# Patient Record
Sex: Male | Born: 1935 | Race: Black or African American | Hispanic: No | Marital: Married | Smoking: Current every day smoker
Health system: Southern US, Community
[De-identification: ages and names within clinical notes are randomized; demographics above are authoritative.]

## PROBLEM LIST (undated history)

## (undated) DIAGNOSIS — C801 Malignant (primary) neoplasm, unspecified: Secondary | ICD-10-CM

## (undated) DIAGNOSIS — N400 Enlarged prostate without lower urinary tract symptoms: Secondary | ICD-10-CM

## (undated) DIAGNOSIS — M7989 Other specified soft tissue disorders: Secondary | ICD-10-CM

## (undated) DIAGNOSIS — I10 Essential (primary) hypertension: Secondary | ICD-10-CM

## (undated) DIAGNOSIS — J449 Chronic obstructive pulmonary disease, unspecified: Secondary | ICD-10-CM

## (undated) DIAGNOSIS — Z803 Family history of malignant neoplasm of breast: Secondary | ICD-10-CM

## (undated) DIAGNOSIS — Z972 Presence of dental prosthetic device (complete) (partial): Secondary | ICD-10-CM

## (undated) DIAGNOSIS — Z8042 Family history of malignant neoplasm of prostate: Secondary | ICD-10-CM

## (undated) HISTORY — PX: HERNIA REPAIR: SHX51

## (undated) HISTORY — DX: Family history of malignant neoplasm of prostate: Z80.42

## (undated) HISTORY — PX: COLONOSCOPY: SHX174

## (undated) HISTORY — DX: Family history of malignant neoplasm of breast: Z80.3

---

## 2007-11-02 ENCOUNTER — Ambulatory Visit: Payer: Self-pay | Admitting: Urology

## 2012-11-17 ENCOUNTER — Observation Stay: Payer: Self-pay | Admitting: Family Medicine

## 2012-11-17 LAB — URINALYSIS, COMPLETE
Bilirubin,UR: NEGATIVE
Blood: NEGATIVE
Glucose,UR: NEGATIVE mg/dL (ref 0–75)
Leukocyte Esterase: NEGATIVE
Nitrite: NEGATIVE
Protein: NEGATIVE
RBC,UR: 1 /HPF (ref 0–5)
Specific Gravity: 1.011 (ref 1.003–1.030)
Squamous Epithelial: <1

## 2012-11-17 LAB — CK TOTAL AND CKMB (NOT AT ARMC)
CK, Total: 206 U/L (ref 35–232)
CK-MB: 1.2 ng/mL (ref 0.5–3.6)

## 2012-11-17 LAB — CBC
HCT: 48.8 % (ref 40.0–52.0)
HGB: 15.9 g/dL (ref 13.0–18.0)
MCHC: 32.5 g/dL (ref 32.0–36.0)
Platelet: 305 10*3/uL (ref 150–440)
RBC: 5.47 10*6/uL (ref 4.40–5.90)
WBC: 5.9 10*3/uL (ref 3.8–10.6)

## 2012-11-17 LAB — COMPREHENSIVE METABOLIC PANEL
Albumin: 3.6 g/dL (ref 3.4–5.0)
Alkaline Phosphatase: 79 U/L (ref 50–136)
Anion Gap: 8 (ref 7–16)
BUN: 11 mg/dL (ref 7–18)
Co2: 27 mmol/L (ref 21–32)
Creatinine: 1.05 mg/dL (ref 0.60–1.30)
EGFR (African American): >60
Glucose: 132 mg/dL — ABNORMAL HIGH (ref 65–99)
Potassium: 3.1 mmol/L — ABNORMAL LOW (ref 3.5–5.1)
SGOT(AST): 25 U/L (ref 15–37)
SGPT (ALT): 17 U/L (ref 12–78)
Sodium: 138 mmol/L (ref 136–145)
Total Protein: 7.5 g/dL (ref 6.4–8.2)

## 2012-11-17 LAB — TROPONIN I: Troponin-I: <0.02 ng/mL

## 2012-11-18 LAB — LIPID PANEL
Cholesterol: 126 mg/dL
HDL Cholesterol: 59 mg/dL
Ldl Cholesterol, Calc: 51 mg/dL
Triglycerides: 82 mg/dL
VLDL Cholesterol, Calc: 16 mg/dL

## 2012-11-18 LAB — POTASSIUM: Potassium: 3.7 mmol/L

## 2012-11-18 LAB — TSH: Thyroid Stimulating Horm: 0.653 u[IU]/mL

## 2014-06-24 DIAGNOSIS — I10 Essential (primary) hypertension: Secondary | ICD-10-CM | POA: Insufficient documentation

## 2014-06-24 DIAGNOSIS — M549 Dorsalgia, unspecified: Secondary | ICD-10-CM | POA: Insufficient documentation

## 2014-06-24 DIAGNOSIS — E538 Deficiency of other specified B group vitamins: Secondary | ICD-10-CM | POA: Insufficient documentation

## 2014-06-24 DIAGNOSIS — N400 Enlarged prostate without lower urinary tract symptoms: Secondary | ICD-10-CM | POA: Insufficient documentation

## 2015-03-31 NOTE — H&P (Signed)
PATIENT NAME:  Jacob [name], Jacob [name] MR#:  [id number] DATE OF BIRTH:  06/19/1934  DATE OF ADMISSION:  11/17/2012  PRIMARY CARE PHYSICIAN: Nonlocal REFERRING PHYSICIAN: Dr. Renard Hamper  CHIEF COMPLAINT: Dizziness today.   HISTORY OF PRESENT ILLNESS: 81 year old African American male with history of hypertension presented to the ED with dizziness today. Patient was fine until this morning when he felt dizzy which is room spinning-like feeling and then patient almost passed out, fell to the floor and hurt his face. He had nausea, vomiting after fall but denies any headache, slurred speech, incontinence, or dysphagia. Patient has a lip laceration.   PAST MEDICAL HISTORY: Hypertension.   SOCIAL HISTORY: Smokes 1/2 pack a day for more than 40 years. Denies any alcohol drinking or illicit drugs.    FAMILY HISTORY: Mother had CVA.   PAST SURGICAL HISTORY: Hernia repair.   MEDICATIONS: Patient cannot remember his home medications. He said he is taking hypertension pill and fluid pill but did not take for the past two days.   ALLERGIES: No.   REVIEW OF SYSTEMS: CONSTITUTIONAL: Patient denies any fever, chills. No headache but has dizziness. No weakness. EYES: No double vision, blurred vision. ENT: No slurred speech, dysphagia, epistaxis or postnasal drip. CARDIOVASCULAR: No chest pain, palpitation, orthopnea, or nocturnal dyspnea. No leg edema. PULMONARY: No cough, sputum, shortness of breath or hemoptysis. GASTROINTESTINAL: No abdominal pain but has nausea, vomiting. No diarrhea, melena, or bloody stool. GENITOURINARY: No dysuria, hematuria. SKIN: No rash or jaundice. NEUROLOGY: Has near syncope, dizziness. No seizure. HEMATOLOGY: No easy bruising, bleeding.   PHYSICAL EXAMINATION:  VITAL SIGNS: Temperature 97.5, blood pressure 140/80, pulse 69, respirations 16, oxygen saturation 98% on room air.   GENERAL: Patient is alert, awake, oriented, in no acute distress.   HEENT: Pupils round, equal, reactive to light,  accommodation. Moist oral mucosa. Clear oropharynx. There is a mouth laceration on the upper lip.   NECK: Supple. No JVD or carotid bruits. No lymphadenopathy. No thyromegaly.   CARDIOVASCULAR: S1, S2 regular rate, rhythm. No murmurs, gallops.   PULMONARY: Bilateral air entry. No wheezing, rales. No use of accessory muscles to breathe.   ABDOMEN: Soft. No distention or tenderness. No organomegaly. Bowel sounds present.   EXTREMITIES: No edema, clubbing, or cyanosis. No calf tenderness. Strong bilateral pedal pulses.   SKIN: No rash or jaundice.   NEUROLOGIC: Alert and oriented x3. No focal deficit. Deep tendon reflexes 2+. Power 5/5. Sensation intact.   LABORATORY, DIAGNOSTIC AND RADIOLOGIC DATA: Urinalysis is negative. CAT scan of maxillofacial area without contrast showed no official fracture. CAT scan of head no intracranial hemorrhage.   CAT scan of spine no cervical spine fracture or static listhesis.   CBC normal.   Glucose 132, BUN 11, creatinine 1.05, sodium 138, potassium 3.1, chloride 103, bicarbonate 27, lipase 72. CK 206, CK-MB 1.2, troponin less than 0.02. EKG showed normal sinus rhythm at 74 beats per minute.    IMPRESSION:  1. Near syncope.  2. Dizziness.  3. Hypokalemia.  4. Hypertension.  5. Lip laceration.  6. Tobacco abuse.   PLAN OF TREATMENT:  1. Patient will be placed for observation. Will continue telemetry monitor. Give aspirin and Lopressor and check lipid panel, carotid duplex and echocardiogram for syncope work-up.  2. For hypokalemia will give potassium and follow up potassium and magnesium level.  3. Smoking cessation, was counseled for five minutes.   Discussed the patient's situation and plan of treatment with patient, patient's wife and daughter.   TIME SPENT:  About 55 minutes.   ____________________________ Demetrios Loll, MD qc:cms D: 11/17/2012 13:43:25 ET T: 11/17/2012 14:34:09 ET JOB#: [id number]  cc: Demetrios Loll, MD, <Dictator>  Demetrios Loll  MD ELECTRONICALLY SIGNED 11/17/2012 14:53

## 2015-03-31 NOTE — Discharge Summary (Signed)
PATIENT NAME:  Jacob [name], Jacob [name] MR#:  [id number] DATE OF BIRTH:  [birth year]  DATE OF ADMISSION:  11/17/2012 DATE OF DISCHARGE:  11/18/2012  REASONS FOR ADMISSION:  1. Near syncope with fall. 2. Dizzy spell.  3. Hypokalemia.  4. Hypertension.  5. Lip laceration.  6. Tobacco abuse.   LABORATORY, DIAGNOSTIC AND RADIOLOGICAL DATA: Glucose 132, creatinine 1.05. Potassium 3.1 on admission, after replacement 3.7. Magnesium 1.6, also replaced. LFTs within normal limits. Cardiac enzymes negative. TSH 0.653. White count 5.9, hemoglobin 15.9, platelets 305. Urinalysis within normal limits. Echo Doppler left atrium with mild dilation, ejection fraction of 45% to 50%, left ventricular systolic function is mild to moderately reduced. There is trace mitral regurgitation. Aortic valve opens well. Mild aortic regurgitation. No pericardial effusions. Trace tricuspid regurgitation. No findings of global or any other wall hypokinesis. Ultrasound of the carotid arteries did not show any significant hemodynamic stenosis. CT of the head no intracranial hemorrhage. Mucous retention cyst on left maxilla. 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 CT of the cervical spine no acute fractures or static listhesis.   DISPOSITION: Home.   MEDICATIONS AT DISCHARGE:  1. Hydrochlorothiazide 35 mg a day.  2. Amlodipine 10 mg a day. 3. Aspirin 81 mg a day. 4. Metoprolol 25 mg twice a day.   FOLLOW UP: Follow up with primary care physician in 1 to 2 weeks.   NOTE: Patient counseled about smoking cessation, alcohol cessation. He had an echocardiogram that showed an ejection fraction of 45% to 50% with no significant ischemic symptoms, although due to the fact that his ejection fraction is slightly decreased we recommended to follow up with his primary care physician for improved management of blood pressure. Patient is to take aspirin and if chest  pain happens or events like dizziness persists he will need to have a stress test and possibly a catheterization.    HOSPITAL COURSE: The patient is admitted with history of a fall after a dizzy spell. He was in his previous state of health without any problems. He has never had any episodes like this happen before. He felt like his room was spinning and almost passed out. He tried to get up and fell. He had a laceration of his upper lip. He did not have any chest pain, nausea, vomiting, or headaches. Patient was admitted for observation. Work-up was done with an echocardiogram that showed ejection fraction of 45% to 50% but no significant events that would cause him to have the syncopal episode or the near syncopal episode. His ultrasound of carotid arteries was negative and his CT scan did not show any stroke related abnormality and his neurologic test is negative. Patient is discharged home in good condition with recommendations. Apparently patient was mildly dehydrated when this happened. It could be due to orthostatic hypotension for what the patient has been given recommendations to stand slowly, drink plenty of fluids in the morning and stop smoking as well.   TIME SPENT: I spent about 35 minutes with this patient today.  ____________________________ [place] Sink, MD rsg:cms D: 11/18/2012 13:52:06 ET T: 11/18/2012 14:24:21 ET JOB#: [id number]  cc: [place] Sink, MD, <Dictator>  Shoua Ressler America Brown MD ELECTRONICALLY SIGNED 11/25/2012 14:06

## 2015-06-25 DIAGNOSIS — K5909 Other constipation: Secondary | ICD-10-CM

## 2015-06-25 HISTORY — DX: Other constipation: K59.09

## 2015-09-23 ENCOUNTER — Encounter: Payer: Self-pay | Admitting: *Deleted

## 2015-09-28 NOTE — Discharge Instructions (Signed)

## 2015-09-30 ENCOUNTER — Ambulatory Visit: Payer: Medicare Other | Admitting: Anesthesiology

## 2015-09-30 ENCOUNTER — Ambulatory Visit
Admission: RE | Admit: 2015-09-30 | Discharge: 2015-09-30 | Disposition: A | Discharge status: Home / Self Care | Payer: Medicare Other | Source: Ambulatory Visit | Attending: Ophthalmology | Admitting: Ophthalmology

## 2015-09-30 ENCOUNTER — Encounter
Admission: RE | Disposition: A | Discharge status: Home / Self Care | Payer: Self-pay | Source: Ambulatory Visit | Attending: Ophthalmology

## 2015-09-30 DIAGNOSIS — F17210 Nicotine dependence, cigarettes, uncomplicated: Secondary | ICD-10-CM | POA: Diagnosis not present

## 2015-09-30 DIAGNOSIS — Y92234 Operating room of hospital as the place of occurrence of the external cause: Secondary | ICD-10-CM | POA: Diagnosis not present

## 2015-09-30 DIAGNOSIS — Z79899 Other long term (current) drug therapy: Secondary | ICD-10-CM | POA: Insufficient documentation

## 2015-09-30 DIAGNOSIS — J449 Chronic obstructive pulmonary disease, unspecified: Secondary | ICD-10-CM | POA: Diagnosis not present

## 2015-09-30 DIAGNOSIS — I493 Ventricular premature depolarization: Secondary | ICD-10-CM | POA: Diagnosis not present

## 2015-09-30 DIAGNOSIS — Y838 Other surgical procedures as the cause of abnormal reaction of the patient, or of later complication, without mention of misadventure at the time of the procedure: Secondary | ICD-10-CM | POA: Insufficient documentation

## 2015-09-30 DIAGNOSIS — I10 Essential (primary) hypertension: Secondary | ICD-10-CM | POA: Diagnosis not present

## 2015-09-30 DIAGNOSIS — Z7982 Long term (current) use of aspirin: Secondary | ICD-10-CM | POA: Insufficient documentation

## 2015-09-30 DIAGNOSIS — H2511 Age-related nuclear cataract, right eye: Secondary | ICD-10-CM | POA: Insufficient documentation

## 2015-09-30 DIAGNOSIS — I9789 Other postprocedural complications and disorders of the circulatory system, not elsewhere classified: Secondary | ICD-10-CM | POA: Diagnosis not present

## 2015-09-30 HISTORY — DX: Presence of dental prosthetic device (complete) (partial): Z97.2

## 2015-09-30 HISTORY — DX: Other specified soft tissue disorders: M79.89

## 2015-09-30 HISTORY — DX: Chronic obstructive pulmonary disease, unspecified: J44.9

## 2015-09-30 HISTORY — DX: Essential (primary) hypertension: I10

## 2015-09-30 HISTORY — PX: CATARACT EXTRACTION W/PHACO: SHX586

## 2015-09-30 SURGERY — PHACOEMULSIFICATION, CATARACT, WITH IOL INSERTION
Anesthesia: Monitor Anesthesia Care | Laterality: Right | Wound class: Clean

## 2015-09-30 MED ORDER — CEFUROXIME OPHTHALMIC INJECTION 1 MG/0.1 ML
INJECTION | OPHTHALMIC | Status: DC | PRN
  Administered 2015-09-30: 0.1 mL via INTRACAMERAL

## 2015-09-30 MED ORDER — FENTANYL CITRATE (PF) 100 MCG/2ML IJ SOLN
INTRAMUSCULAR | Status: DC | PRN
  Administered 2015-09-30: 50 ug via INTRAVENOUS

## 2015-09-30 MED ORDER — NA HYALUR & NA CHOND-NA HYALUR 0.4-0.35 ML IO KIT
PACK | INTRAOCULAR | Status: DC | PRN
  Administered 2015-09-30: 1 mL via INTRAOCULAR

## 2015-09-30 MED ORDER — BRIMONIDINE TARTRATE 0.2 % OP SOLN
OPHTHALMIC | Status: DC | PRN
  Administered 2015-09-30: 1 [drp] via OPHTHALMIC

## 2015-09-30 MED ORDER — TETRACAINE HCL 0.5 % OP SOLN
1.0000 [drp] | OPHTHALMIC | Status: DC | PRN
  Administered 2015-09-30: 1 [drp] via OPHTHALMIC

## 2015-09-30 MED ORDER — ARMC OPHTHALMIC DILATING GEL
1.0000 "application " | OPHTHALMIC | Status: DC | PRN
  Administered 2015-09-30 (×2): 1 via OPHTHALMIC

## 2015-09-30 MED ORDER — EPINEPHRINE HCL 1 MG/ML IJ SOLN
INTRAOCULAR | Status: DC | PRN
  Administered 2015-09-30: 75 mL via OPHTHALMIC

## 2015-09-30 MED ORDER — POVIDONE-IODINE 5 % OP SOLN
1.0000 "application " | OPHTHALMIC | Status: DC | PRN
  Administered 2015-09-30: 1 via OPHTHALMIC

## 2015-09-30 MED ORDER — TIMOLOL MALEATE 0.5 % OP SOLN
OPHTHALMIC | Status: DC | PRN
  Administered 2015-09-30: 1 [drp] via OPHTHALMIC

## 2015-09-30 MED ORDER — MIDAZOLAM HCL 2 MG/2ML IJ SOLN
INTRAMUSCULAR | Status: DC | PRN
  Administered 2015-09-30: 1 mg via INTRAVENOUS

## 2015-09-30 SURGICAL SUPPLY — 26 items
CANNULA ANT/CHMB 27GA (MISCELLANEOUS) ×3 IMPLANT
GLOVE SURG LX 7.5 STRW (GLOVE) ×2
GLOVE SURG LX STRL 7.5 STRW (GLOVE) ×1 IMPLANT
GLOVE SURG TRIUMPH 8.0 PF LTX (GLOVE) ×3 IMPLANT
GOWN STRL REUS W/ TWL LRG LVL3 (GOWN DISPOSABLE) ×2 IMPLANT
GOWN STRL REUS W/TWL LRG LVL3 (GOWN DISPOSABLE) ×4
LENS IOL TECNIS 22.0 (Intraocular Lens) ×3 IMPLANT
LENS IOL TECNIS MONO 1P 22.0 (Intraocular Lens) ×1 IMPLANT
MARKER SKIN SURG W/RULER VIO (MISCELLANEOUS) ×3 IMPLANT
NDL RETROBULBAR .5 NSTRL (NEEDLE) IMPLANT
NEEDLE FILTER BLUNT 18X 1/2SAF (NEEDLE) ×2
NEEDLE FILTER BLUNT 18X1 1/2 (NEEDLE) ×1 IMPLANT
PACK CATARACT BRASINGTON (MISCELLANEOUS) ×3 IMPLANT
PACK EYE AFTER SURG (MISCELLANEOUS) ×3 IMPLANT
PACK OPTHALMIC (MISCELLANEOUS) ×3 IMPLANT
RING MALYGIN 7.0 (MISCELLANEOUS) IMPLANT
SUT ETHILON 10-0 CS-B-6CS-B-6 (SUTURE)
SUT VICRYL  9 0 (SUTURE)
SUT VICRYL 9 0 (SUTURE) IMPLANT
SUTURE EHLN 10-0 CS-B-6CS-B-6 (SUTURE) IMPLANT
SYR 3ML LL SCALE MARK (SYRINGE) ×3 IMPLANT
SYR 5ML LL (SYRINGE) IMPLANT
SYR TB 1ML LUER SLIP (SYRINGE) ×3 IMPLANT
WATER STERILE IRR 250ML POUR (IV SOLUTION) ×3 IMPLANT
WATER STERILE IRR 500ML POUR (IV SOLUTION) IMPLANT
WIPE NON LINTING 3.25X3.25 (MISCELLANEOUS) ×3 IMPLANT

## 2015-09-30 NOTE — Op Note (Signed)
LOCATION:  [place]   PREOPERATIVE DIAGNOSIS:    Nuclear sclerotic cataract right eye. H25.11   POSTOPERATIVE DIAGNOSIS:  Nuclear sclerotic cataract right eye.     PROCEDURE:  Phacoemusification with posterior chamber intraocular lens placement of the right eye   LENS:   Implant Name Type Inv. Item Serial No. Manufacturer Lot No. LRB No. Used  LENS IMPL INTRAOC [id number] 22.0 - [id number] Intraocular Lens LENS IMPL INTRAOC [id number] 22.0 [id number] AMO   Right 1        ULTRASOUND TIME: 19 % of 2 minutes, 30 seconds.  CDE 28.6   SURGEON:  Wyonia Hough, MD   ANESTHESIA:  Topical with tetracaine drops and 2% Xylocaine jelly.   COMPLICATIONS:  None.   DESCRIPTION OF PROCEDURE:  The patient was identified in the holding room and transported to the operating room and placed in the supine position under the operating microscope.  The right eye was identified as the operative eye and it was prepped and draped in the usual sterile ophthalmic fashion.   A 1 millimeter clear-corneal paracentesis was made at the 12:00 position.  The anterior chamber was filled with Viscoat viscoelastic.  A 2.4 millimeter keratome was used to make a near-clear corneal incision at the 9:00 position.  A curvilinear capsulorrhexis was made with a cystotome and capsulorrhexis forceps.  Balanced salt solution was used to hydrodissect and hydrodelineate the nucleus.   Phacoemulsification was then used in stop and chop fashion to remove the lens nucleus and epinucleus.  The remaining cortex was then removed using the irrigation and aspiration handpiece. Provisc was then placed into the capsular bag to distend it for lens placement.  A lens was then injected into the capsular bag.  The remaining viscoelastic was aspirated.   Wounds were hydrated with balanced salt solution.  The anterior chamber was inflated to a physiologic pressure with balanced salt solution.  No wound leaks were noted. Cefuroxime 0.1 ml of  a 10mg /ml solution was injected into the anterior chamber for a dose of 1 mg of intracameral antibiotic at the completion of the case.   Timolol and Brimonidine drops were applied to the eye.  The patient was taken to the recovery room in stable condition without complications of anesthesia or surgery.   Lynsi Dooner 09/30/2015, 9:08 AM

## 2015-09-30 NOTE — H&P (Signed)
  The History and Physical notes are on paper, have been signed, and are to be scanned. The patient remains stable and unchanged from the H&P.   Previous H&P reviewed, patient examined, and there are no changes.  Jacob [name] 09/30/2015 8:40 AM

## 2015-09-30 NOTE — Anesthesia Postprocedure Evaluation (Signed)
  Anesthesia Post-op Note  Patient: Jacob [name]  Procedure(s) Performed: Procedure(s): CATARACT EXTRACTION PHACO AND INTRAOCULAR LENS PLACEMENT (IOC) (Right)  Anesthesia type:MAC  Patient location: PACU  Post pain: Pain level controlled  Post assessment: Post-op [name] signs reviewed, Patient's Cardiovascular Status Stable, Respiratory Function Stable, Patent Airway and No signs of Nausea or vomiting  Post [name] signs: Reviewed and stable  Last Vitals:  Filed Vitals:   09/30/15 0925  BP: 147/93  Pulse: 44  Temp:   Resp: 14    Level of consciousness: awake, alert  and patient cooperative  Complications: No apparent anesthesia complications

## 2015-09-30 NOTE — Progress Notes (Signed)
Bigeminy PVCs noted intraop per Coralyn Mark, CRNA, also noted in postop. Pt asymptomatic. Dr. Jaci Standard aware, no new orders received, pt OK to discharge home.

## 2015-09-30 NOTE — Anesthesia Preprocedure Evaluation (Signed)
Anesthesia Evaluation  Patient identified by MRN, date of birth, ID band Patient awake    Reviewed: Allergy & Precautions, H&P , Patient's Chart, lab work & pertinent test results  Airway Mallampati: I  TM Distance: >3 FB Neck ROM: full    Dental no notable dental hx.    Pulmonary Current Smoker,    Pulmonary exam normal        Cardiovascular hypertension, Normal cardiovascular exam     Neuro/Psych    GI/Hepatic negative GI ROS, Neg liver ROS,   Endo/Other  negative endocrine ROS  Renal/GU negative Renal ROS     Musculoskeletal   Abdominal   Peds  Hematology negative hematology ROS (+)   Anesthesia Other Findings   Reproductive/Obstetrics                             Anesthesia Physical Anesthesia Plan  ASA: II  Anesthesia Plan: MAC   Post-op Pain Management:    Induction:   Airway Management Planned:   Additional Equipment:   Intra-op Plan:   Post-operative Plan:   Informed Consent: I have reviewed the patients History and Physical, chart, labs and discussed the procedure including the risks, benefits and alternatives for the proposed anesthesia with the patient or authorized representative who has indicated his/her understanding and acceptance.     Plan Discussed with: CRNA  Anesthesia Plan Comments:         Anesthesia Quick Evaluation

## 2015-09-30 NOTE — Anesthesia Procedure Notes (Signed)
Procedure Name: MAC Performed by: Nat Christen Pre-anesthesia Checklist: Patient identified, Emergency Drugs available, Suction available, Patient being monitored and Timeout performed Patient Re-evaluated:Patient Re-evaluated prior to inductionOxygen Delivery Method: Nasal cannula

## 2015-09-30 NOTE — Transfer of Care (Signed)
Immediate Anesthesia Transfer of Care Note  Patient: Jacob [name]  Procedure(s) Performed: Procedure(s): CATARACT EXTRACTION PHACO AND INTRAOCULAR LENS PLACEMENT (IOC) (Right)  Patient Location: PACU  Anesthesia Type: MAC  Level of Consciousness: awake, alert  and patient cooperative  Airway and Oxygen Therapy: Patient Spontanous Breathing and Patient connected to supplemental oxygen  Post-op Assessment: Post-op Vital signs reviewed, Patient's Cardiovascular Status Stable, Respiratory Function Stable, Patent Airway and No signs of Nausea or vomiting  Post-op Vital Signs: Reviewed and stable  Complications: No apparent anesthesia complications

## 2015-10-01 ENCOUNTER — Encounter: Payer: Self-pay | Admitting: Ophthalmology

## 2015-12-24 DIAGNOSIS — Z79899 Other long term (current) drug therapy: Secondary | ICD-10-CM | POA: Insufficient documentation

## 2015-12-24 DIAGNOSIS — F17210 Nicotine dependence, cigarettes, uncomplicated: Secondary | ICD-10-CM | POA: Insufficient documentation

## 2016-12-02 ENCOUNTER — Other Ambulatory Visit
Admission: RE | Admit: 2016-12-02 | Discharge: 2016-12-02 | Disposition: A | Discharge status: Home / Self Care | Payer: Medicare Other | Source: Ambulatory Visit | Attending: Urology | Admitting: Urology

## 2016-12-02 ENCOUNTER — Encounter: Payer: Self-pay | Admitting: Urology

## 2016-12-02 ENCOUNTER — Ambulatory Visit (INDEPENDENT_AMBULATORY_CARE_PROVIDER_SITE_OTHER): Payer: Medicare Other | Admitting: Urology

## 2016-12-02 ENCOUNTER — Other Ambulatory Visit: Payer: Self-pay | Admitting: *Deleted

## 2016-12-02 VITALS — BP 150/97 | HR 76 | Ht 72.0 in | Wt 145.0 lb

## 2016-12-02 DIAGNOSIS — N138 Other obstructive and reflux uropathy: Secondary | ICD-10-CM

## 2016-12-02 DIAGNOSIS — N281 Cyst of kidney, acquired: Secondary | ICD-10-CM | POA: Diagnosis not present

## 2016-12-02 DIAGNOSIS — R310 Gross hematuria: Secondary | ICD-10-CM

## 2016-12-02 DIAGNOSIS — N401 Enlarged prostate with lower urinary tract symptoms: Secondary | ICD-10-CM

## 2016-12-02 DIAGNOSIS — N323 Diverticulum of bladder: Secondary | ICD-10-CM | POA: Diagnosis not present

## 2016-12-02 LAB — BUN: BUN: 18 mg/dL (ref 6–20)

## 2016-12-02 LAB — URINALYSIS, COMPLETE (UACMP) WITH MICROSCOPIC
BILIRUBIN URINE: NEGATIVE
Glucose, UA: NEGATIVE mg/dL
KETONES UR: NEGATIVE mg/dL
LEUKOCYTES UA: NEGATIVE
NITRITE: NEGATIVE
Protein, ur: NEGATIVE mg/dL
SPECIFIC GRAVITY, URINE: 1.015 (ref 1.005–1.030)
pH: 6 (ref 5.0–8.0)

## 2016-12-02 LAB — CREATININE, SERUM
Creatinine, Ser: 1.21 mg/dL (ref 0.61–1.24)
GFR calc Af Amer: >60 mL/min (ref >60)
GFR, EST NON AFRICAN AMERICAN: 55 mL/min — AB (ref >60)

## 2016-12-02 NOTE — Progress Notes (Signed)
12/02/2016 10:42 AM   Jacob [name] [birth year] [id number]  Referring provider: [name], MD [facility] [place], [place] [address]  Chief Complaint  Patient presents with  . New Patient (Initial Visit)    Gross hematuria referred by Dr. Dorthula Perfect    HPI: Patient is a 82 -year-old [place] American male who presents today as a referral from their PCP, [name], for gross hematuria.    Patient had an episode of gross hematuria about one months.  Then one week ago, the gross hematuria lasted two days and then cleared up.   Patient does have a prior history of hematuria while living in [place] approximately ten years ago and was told it was due to stones.  Reviewing his records, in 2008 he was evaluated for gross hematuria by Dr. Maryan Puls. CT urogram at that time noted multiple bilateral cysts as described above involving the kidneys.  There does not appear to be evidence of hydronephrosis or calculi nor enhancing masses.  Prostatic enlargement as described above with indentation along the urinary bladder. Bladder diverticulum.   He states that he had a TARGIS at that time.    He does not have a prior history of recurrent urinary tract infections, nephrolithiasis, trauma to the genitourinary tract, BPH or malignancies of the genitourinary tract.   He does not have a family medical history of nephrolithiasis, malignancies of the genitourinary tract or hematuria.   Today, he is not having symptoms of frequent urination, urgency, dysuria, nocturia, incontinence, hesitancy, intermittency, straining to urinate or a weak urinary stream.  His UA today demonstrates 6-30 RBC's/hpf.  He is not experiencing any suprapubic pain, abdominal pain or flank pain. He denies any recent fevers, chills, nausea or vomiting.   He is a smoker.   He has worked with [employer].    PMH: Past Medical History:  Diagnosis Date  . COPD (chronic obstructive pulmonary disease)  ([place])    pt denies.  . Hypertension   . Swelling of lower extremity    bilateral  . Wears dentures    full upper and lower - do not fit well.    Surgical History: Past Surgical History:  Procedure Laterality Date  . CATARACT EXTRACTION W/PHACO Right 09/30/2015   Procedure: CATARACT EXTRACTION PHACO AND INTRAOCULAR LENS PLACEMENT ([place]);  Surgeon: Leandrew Koyanagi, MD;  Location: [facility];  Service: Ophthalmology;  Laterality: Right;  . COLONOSCOPY    . HERNIA REPAIR      Home Medications:  Allergies as of 12/02/2016   No Known Allergies     Medication List       Accurate as of 12/02/16 10:42 AM. Always use your most recent med list.          acetaminophen 500 MG tablet Commonly known as:  TYLENOL Take by mouth.   amLODipine 10 MG tablet Commonly known as:  NORVASC Take 10 mg by mouth daily. PM   aspirin 81 MG tablet Take 81 mg by mouth daily. PM   cyanocobalamin 500 MCG tablet Take 500 mcg by mouth daily. PM   cyclobenzaprine 10 MG tablet Commonly known as:  FLEXERIL Take 10 mg by mouth daily as needed for muscle spasms.   hydrochlorothiazide 25 MG tablet Commonly known as:  HYDRODIURIL Take 25 mg by mouth daily. PM   metoprolol succinate 25 MG 24 hr tablet Commonly known as:  TOPROL-XL Take 25 mg by mouth 2 (two) times daily.   polyethylene glycol powder  powder Commonly known as:  GLYCOLAX/MIRALAX TAKE 17G ONCE DAILY AS NEEDED FOR CONSTIPATION (ADJUST AMOUNT AS NEEDED) MIX IN 4-8 OZ OF FLUID       Allergies: No Known Allergies  Family History: Family History  Problem Relation Age of Onset  . Prostate cancer Brother   . Kidney cancer Neg Hx   . Kidney disease Neg Hx   . Bladder Cancer Neg Hx     Social History:  reports that he has been smoking Cigarettes.  He has a 22.50 pack-year smoking history. He has never used smokeless tobacco. He reports that he does not drink alcohol or use drugs.  ROS: UROLOGY Frequent Urination?:  No Hard to postpone urination?: No Burning/pain with urination?: No Get up at night to urinate?: No Leakage of urine?: No Urine stream starts and stops?: No Trouble starting stream?: No Do you have to strain to urinate?: No Blood in urine?: Yes Urinary tract infection?: No Sexually transmitted disease?: No Injury to kidneys or bladder?: No Painful intercourse?: No Weak stream?: No Erection problems?: No Penile pain?: No  Gastrointestinal Nausea?: No Vomiting?: No Indigestion/heartburn?: No Diarrhea?: No Constipation?: Yes  Constitutional Fever: No Night sweats?: No Weight loss?: Yes Fatigue?: No  Skin Skin rash/lesions?: Yes Itching?: No  Eyes Blurred vision?: Yes Double vision?: Yes  Ears/Nose/Throat Sore throat?: No Sinus problems?: No  Hematologic/Lymphatic Swollen glands?: No Easy bruising?: No  Cardiovascular Leg swelling?: Yes Chest pain?: No  Respiratory Cough?: No Shortness of breath?: No  Endocrine Excessive thirst?: No  Musculoskeletal Back pain?: No Joint pain?: No  Neurological Headaches?: No Dizziness?: No  Psychologic Depression?: No Anxiety?: No  Physical Exam: BP (!) 150/97   Pulse 76   Ht 6' (1.829 m)   Wt 145 lb (65.8 kg)   BMI 19.67 kg/m   Constitutional: Well nourished. Alert and oriented, No acute distress. HEENT: [place] AT, moist mucus membranes. Trachea midline, no masses. Cardiovascular: No clubbing, cyanosis, or edema. Respiratory: Normal respiratory effort, no increased work of breathing. GI: Abdomen is soft, non tender, non distended, no abdominal masses. Liver and spleen not palpable.  No hernias appreciated.  Stool sample for occult testing is not indicated.   GU: No CVA tenderness.  No bladder fullness or masses.  Patient with uncircumcised phallus. Foreskin easily retracted  Urethral meatus is patent.  No penile discharge. No penile lesions or rashes. Scrotum without lesions, cysts, rashes and/or edema.  Vitiligo is present on shaft and scotum.   Testicles are located scrotally bilaterally. No masses are appreciated in the testicles. Left and right epididymis are normal. Rectal: Patient with  normal sphincter tone. Anus and perineum without scarring or rashes. No rectal masses are appreciated. Prostate is approximately 50 grams, no nodules are appreciated. Seminal vesicles are normal. Skin: No rashes, bruises or suspicious lesions. Lymph: No cervical or inguinal adenopathy. Neurologic: Grossly intact, no focal deficits, moving all 4 extremities. Psychiatric: Normal mood and affect.  Laboratory Data: Lab Results  Component Value Date   WBC 5.9 11/17/2012   HGB 15.9 11/17/2012   HCT 48.8 11/17/2012   MCV 89 11/17/2012   PLT 305 11/17/2012    Lab Results  Component Value Date   CREATININE 1.21 12/02/2016    Lab Results  Component Value Date   TSH 0.653 11/18/2012       Component Value Date/Time   CHOL 126 11/18/2012 0451   HDL 59 11/18/2012 0451   VLDL 16 11/18/2012 0451   LDLCALC 51 11/18/2012 0451  Lab Results  Component Value Date   AST 25 11/17/2012   Lab Results  Component Value Date   ALT 17 11/17/2012    Urinalysis    Component Value Date/Time   COLORURINE YELLOW 12/02/2016 0948   APPEARANCEUR CLEAR 12/02/2016 0948   APPEARANCEUR Clear 11/17/2012 1216   LABSPEC 1.015 12/02/2016 0948   LABSPEC 1.011 11/17/2012 1216   PHURINE 6.0 12/02/2016 0948   GLUCOSEU NEGATIVE 12/02/2016 0948   GLUCOSEU Negative 11/17/2012 1216   HGBUR MODERATE (A) 12/02/2016 0948   BILIRUBINUR NEGATIVE 12/02/2016 0948   BILIRUBINUR Negative 11/17/2012 1216   KETONESUR NEGATIVE 12/02/2016 0948   PROTEINUR NEGATIVE 12/02/2016 0948   NITRITE NEGATIVE 12/02/2016 0948   LEUKOCYTESUR NEGATIVE 12/02/2016 0948   LEUKOCYTESUR Negative 11/17/2012 1216  6-30 RBC's/hpf.  See EPIC.  Pertinent Imaging: PRIOR REPORT IMPORTED FROM THE [facility] WORKFLOW SYSTEM   REASON FOR EXAM:     diverticulum   hematuria  COMMENTS:   PROCEDURE:     CT  - CT ABDOMEN / PELVIS  W/WO  - Nov 02 2007  9:29AM   RESULT:     Helical 5 mm sections were obtained from the lung bases  through  the pubic symphysis pre, immediate and delay intravenous administration of  85 mm of Isovue-370.   Evaluation of the lung bases demonstrates mild interstitial changes and  otherwise grossly unremarkable.   Evaluation of the RIGHT and LEFT kidneys demonstrates bilateral multiple  nonenhancing cysts. The largest on the RIGHT measures 9.31 x 8.55 cm in AP  by transverse dimensions and on the LEFT 6.77 x 6.22 cm.  The largest on  the  LEFT is in the lower pole and on the RIGHT the anterior lower pole. There  does not appear to be evidence of enhancing masses or hydronephrosis  within  the RIGHT or LEFT kidneys nor calculi. The liver, spleen, adrenals and  pancreas are unremarkable. There does not appear to be evidence of  abdominal  or pelvic free fluid, drainable loculated fluid collections, masses or  adenopathy. There is no CT evidence of an abdominal aortic aneurysm. The  celiac, SMA, SMV and IMA are opacified. Contrast is appreciated within the  portal vein though there are areas of decreased attenuation which may  represent a mixing of noncontrasted and contrasted blood. Incidental note  is  made of multiple gallstones within the gallbladder.   Evaluation of the pelvis demonstrates a bladder diverticulum along the  posterolateral aspect of the urinary bladder on the RIGHT. The prostate is  enlarged measuring 5.03 x 6.15 cm in maximal AP by transverse dimensions.  There appears to be indentation along the base of the bladder.   IMPRESSION:   1.Multiple bilateral cysts as described above involving the kidneys. There  does not appear to be evidence of hydronephrosis or calculi nor enhancing  masses.  2. Prostatic enlargement as described above with indentation along the  urinary  bladder.  3.Bladder diverticulum.   Thank you for the opportunity to contribute to the care of your patient.      Assessment & Plan:    1. Gross hematuria  - I explained to the patient that there are a number of causes that can be associated with blood in the urine, such as stones, BPH, UTI's, damage to the urinary tract and/or cancer.  - At this time, I felt that the patient warranted further urologic evaluation.   The AUA guidelines state that a CT urogram is the preferred imaging study  to evaluate hematuria.  - I explained to the patient that a contrast material will be injected into a vein and that in rare instances, an allergic reaction can result and may even life threatening   The patient denies any allergies to contrast, iodine and/or seafood and is not taking metformin.  - Following the imaging study,  I've recommended a cystoscopy. I described how this is performed, typically in an office setting with a flexible cystoscope. We described the risks, benefits, and possible side effects, the most common of which is a minor amount of blood in the urine and/or burning which usually resolves in 24 to 48 hours.    - The patient had the opportunity to ask questions which were answered. Based upon this discussion, the patient is willing to proceed. Therefore, I've ordered: a CT Urogram and cystoscopy.  - The patient will return following all of the above for discussion of the results.     2. BPH with LU TS  - will reassess once hematuria work up is complete  3. Bilateral renal cysts  - will be undergoing CT Urogram  4. Bladder diverticulum  - will be undergoing CT Urogram and cystoscopy  Return for CT Urogram report and cystoscopy.  These notes generated with voice recognition software. I apologize for typographical errors.  Zara Council, [facility] [address], [place], [place] [address] [phone]

## 2016-12-03 LAB — URINE CULTURE

## 2016-12-19 ENCOUNTER — Ambulatory Visit: Payer: Medicare Other

## 2016-12-21 ENCOUNTER — Ambulatory Visit
Admission: RE | Admit: 2016-12-21 | Discharge: 2016-12-21 | Disposition: A | Discharge status: Home / Self Care | Payer: Medicare Other | Source: Ambulatory Visit | Attending: Urology | Admitting: Urology

## 2016-12-21 DIAGNOSIS — K8020 Calculus of gallbladder without cholecystitis without obstruction: Secondary | ICD-10-CM | POA: Insufficient documentation

## 2016-12-21 DIAGNOSIS — I700 Atherosclerosis of aorta: Secondary | ICD-10-CM | POA: Insufficient documentation

## 2016-12-21 DIAGNOSIS — N281 Cyst of kidney, acquired: Secondary | ICD-10-CM | POA: Diagnosis not present

## 2016-12-21 DIAGNOSIS — N400 Enlarged prostate without lower urinary tract symptoms: Secondary | ICD-10-CM | POA: Diagnosis not present

## 2016-12-21 DIAGNOSIS — R310 Gross hematuria: Secondary | ICD-10-CM | POA: Insufficient documentation

## 2016-12-21 MED ORDER — IOPAMIDOL (ISOVUE-300) INJECTION 61%
125.0000 mL | Freq: Once | INTRAVENOUS | Status: AC | PRN
  Administered 2016-12-21: 125 mL via INTRAVENOUS

## 2016-12-28 DIAGNOSIS — J410 Simple chronic bronchitis: Secondary | ICD-10-CM | POA: Insufficient documentation

## 2016-12-30 ENCOUNTER — Ambulatory Visit (INDEPENDENT_AMBULATORY_CARE_PROVIDER_SITE_OTHER): Payer: Medicare Other | Admitting: Urology

## 2016-12-30 ENCOUNTER — Other Ambulatory Visit
Admission: RE | Admit: 2016-12-30 | Discharge: 2016-12-30 | Disposition: A | Discharge status: Home / Self Care | Payer: Medicare Other | Source: Ambulatory Visit | Attending: Urology | Admitting: Urology

## 2016-12-30 ENCOUNTER — Encounter: Payer: Self-pay | Admitting: Urology

## 2016-12-30 ENCOUNTER — Other Ambulatory Visit: Payer: Self-pay

## 2016-12-30 VITALS — BP 155/89 | HR 76 | Ht 72.0 in | Wt 145.0 lb

## 2016-12-30 DIAGNOSIS — R310 Gross hematuria: Secondary | ICD-10-CM

## 2016-12-30 DIAGNOSIS — N281 Cyst of kidney, acquired: Secondary | ICD-10-CM

## 2016-12-30 DIAGNOSIS — N401 Enlarged prostate with lower urinary tract symptoms: Secondary | ICD-10-CM

## 2016-12-30 DIAGNOSIS — N323 Diverticulum of bladder: Secondary | ICD-10-CM

## 2016-12-30 DIAGNOSIS — N138 Other obstructive and reflux uropathy: Secondary | ICD-10-CM

## 2016-12-30 LAB — URINALYSIS, COMPLETE (UACMP) WITH MICROSCOPIC
GLUCOSE, UA: NEGATIVE mg/dL
KETONES UR: NEGATIVE mg/dL
Leukocytes, UA: NEGATIVE
Nitrite: NEGATIVE
SQUAMOUS EPITHELIAL / LPF: NONE SEEN
Specific Gravity, Urine: 1.02 (ref 1.005–1.030)
pH: 7 (ref 5.0–8.0)

## 2016-12-30 MED ORDER — CIPROFLOXACIN HCL 500 MG PO TABS
500.0000 mg | ORAL_TABLET | Freq: Once | ORAL | Status: AC
  Administered 2016-12-30: 500 mg via ORAL

## 2016-12-30 MED ORDER — LIDOCAINE HCL 2 % EX GEL
1.0000 "application " | Freq: Once | CUTANEOUS | Status: AC
  Administered 2016-12-30: 1 via URETHRAL

## 2016-12-30 MED ORDER — FINASTERIDE 5 MG PO TABS: 5.0000 mg | ORAL_TABLET | Freq: Every day | ORAL | 11 refills | Status: DC

## 2016-12-30 NOTE — Progress Notes (Signed)
   12/30/16  CC: Gross hematuria  HPI: 83 yo M with history of gross hematuria, BPH s/p laser TURP (2008) who presents for office cystoscopy.    CT Urogram on 12/21/16 shows sequela of BPH including enlarged prostate with median lobe, bilateral renal cyst with stable ischemic insult to kidney form 2008.  Relatively asymptomatic related to voiding symptoms.    See note from 12/02/17 for details.   Blood pressure (!) 155/89, pulse 76, height 6' (1.829 m), weight 145 lb (65.8 kg). NED. A&Ox3.   No respiratory distress   Abd soft, NT, ND Normal phallus with bilateral descended testicles  Cystoscopy Procedure Note  Patient identification was confirmed, informed consent was obtained, and patient was prepped using Betadine solution.  Lidocaine jelly was administered per urethral meatus.    Preoperative abx where received prior to procedure.     Pre-Procedure: - Inspection reveals a normal caliber ureteral meatus.  Procedure: The flexible cystoscope was introduced without difficulty - No urethral strictures/lesions are present. - Enlarged prostate with trilobar coaptation with somewhat irregular prostate contour consistent s/p previous BPH surgery, friable mucosa, ~6 cm prostatic length - Elevated bladder neck - Bilateral ureteral orifices identified - Bladder mucosa  reveals no ulcers, tumors, or lesions - Small left lateral wall bladder diverticulum - No bladder stones - Mild trabeculation  Retroflexion shows large median lobe with friable prostatic mucosa  Post-Procedure: - Patient tolerated the procedure well  Assessment/ Plan:  1. Gross hematuria Large median lobe likely cause of bleeding CT Urogram with findings above Given bleeding episodes and enlarged prostate, recommend initiation of finasteride 5 mg Discussed medication with patient, side effects, risks/ benefits  - ciprofloxacin (CIPRO) tablet 500 mg; Take 1 tablet (500 mg total) by mouth once. - lidocaine  (XYLOCAINE) 2 % jelly 1 application; Place 1 application into the urethra once.  2. BPH with obstruction/lower urinary tract symptoms Relatively asymptomatic  3. Renal cysts, acquired, bilateral Large asymptomatic renal cysts bilaterally, simple Cr 1.21  4. Bladder diverticulum Small asymptomatic   Return in about 6 months (around 06/29/2017) for IPSS, PVR, uroflow [place]).   Hollice Espy, MD

## 2017-06-28 NOTE — Progress Notes (Deleted)
06/30/2017 9:54 AM   Jacob [name] [birth year] [id number]  Referring provider: [name], MD [facility] [place], [place] [address]  No chief complaint on file.   HPI: 35 AAM with a history of hematuria and BPH with LU TS who presents for a 6 months follow up.  History of hematuria Patient does have a prior history of hematuria while living in [place] approximately ten years ago and was told it was due to stones.  Reviewing his records, in 2008 he was evaluated for gross hematuria by Dr. Maryan Puls. CT urogram at that time noted multiple bilateral cysts as described above involving the kidneys.  There does not appear to be evidence of hydronephrosis or calculi nor enhancing masses.  Prostatic enlargement as described above with indentation along the urinary bladder. Bladder diverticulum.   He states that he had a TARGIS at that time.  He is a smoker.   He has worked with [employer].   CT Urogram on 12/21/16 shows sequela of BPH including enlarged prostate with median lobe, bilateral renal cyst with stable ischemic insult to kidney form 2008.  Cystoscopy demonstrated a friable prostate.  Started on finasteride.   He does not report any gross hematuria.  BPH WITH LUTS  (prostate and/or bladder) His IPSS score today is ***, which is *** lower urinary tract symptomatology. He is *** with his quality life due to his urinary symptoms. His PVR is *** mL.  His previous IPSS score was ***.  His previous PVR is *** mL.    His major complaint today ***.  He has had these symptoms for *** years.  He denies any dysuria, hematuria or suprapubic pain.   He currently taking finasteride 5 mg daily.    His has had TARGIS.       He also denies any recent fevers, chills, nausea or vomiting.     Score:  1-7 Mild 8-19 Moderate 20-35 Severe    PMH: Past Medical History:  Diagnosis Date  . COPD (chronic obstructive pulmonary disease) ([place])    pt denies.   . Hypertension   . Swelling of lower extremity    bilateral  . Wears dentures    full upper and lower - do not fit well.    Surgical History: Past Surgical History:  Procedure Laterality Date  . CATARACT EXTRACTION W/PHACO Right 09/30/2015   Procedure: CATARACT EXTRACTION PHACO AND INTRAOCULAR LENS PLACEMENT ([place]);  Surgeon: Leandrew Koyanagi, MD;  Location: [facility];  Service: Ophthalmology;  Laterality: Right;  . COLONOSCOPY    . HERNIA REPAIR      Home Medications:  Allergies as of 06/30/2017   No Known Allergies     Medication List       Accurate as of 06/28/17  9:54 AM. Always use your most recent med list.          acetaminophen 500 MG tablet Commonly known as:  TYLENOL Take by mouth.   amLODipine 10 MG tablet Commonly known as:  NORVASC Take 10 mg by mouth daily. PM   aspirin 81 MG tablet Take 81 mg by mouth daily. PM   cyanocobalamin 500 MCG tablet Take 500 mcg by mouth daily. PM   cyclobenzaprine 10 MG tablet Commonly known as:  FLEXERIL Take 10 mg by mouth daily as needed for muscle spasms.   finasteride 5 MG tablet Commonly known as:  PROSCAR Take 1 tablet (5 mg total) by mouth daily.   hydrochlorothiazide  25 MG tablet Commonly known as:  HYDRODIURIL Take 25 mg by mouth daily. PM   metoprolol succinate 25 MG 24 hr tablet Commonly known as:  TOPROL-XL Take 25 mg by mouth 2 (two) times daily.   polyethylene glycol powder powder Commonly known as:  GLYCOLAX/MIRALAX TAKE 17G ONCE DAILY AS NEEDED FOR CONSTIPATION (ADJUST AMOUNT AS NEEDED) MIX IN 4-8 OZ OF FLUID       Allergies: No Known Allergies  Family History: Family History  Problem Relation Age of Onset  . Prostate cancer Brother   . Kidney cancer Neg Hx   . Kidney disease Neg Hx   . Bladder Cancer Neg Hx     Social History:  reports that he has been smoking Cigarettes.  He has a 22.50 pack-year smoking history. He has never used smokeless tobacco. He reports that he  does not drink alcohol or use drugs.  ROS:                                        Physical Exam: There were no vitals taken for this visit.  Constitutional: Well nourished. Alert and oriented, No acute distress. HEENT: [place] AT, moist mucus membranes. Trachea midline, no masses. Cardiovascular: No clubbing, cyanosis, or edema. Respiratory: Normal respiratory effort, no increased work of breathing. GI: Abdomen is soft, non tender, non distended, no abdominal masses. Liver and spleen not palpable.  No hernias appreciated.  Stool sample for occult testing is not indicated.   GU: No CVA tenderness.  No bladder fullness or masses.  Patient with uncircumcised phallus. Foreskin easily retracted  Urethral meatus is patent.  No penile discharge. No penile lesions or rashes. Scrotum without lesions, cysts, rashes and/or edema. Vitiligo is present on shaft and scotum.   Testicles are located scrotally bilaterally. No masses are appreciated in the testicles. Left and right epididymis are normal. Rectal: Patient with  normal sphincter tone. Anus and perineum without scarring or rashes. No rectal masses are appreciated. Prostate is approximately 50 grams, no nodules are appreciated. Seminal vesicles are normal. Skin: No rashes, bruises or suspicious lesions. Lymph: No cervical or inguinal adenopathy. Neurologic: Grossly intact, no focal deficits, moving all 4 extremities. Psychiatric: Normal mood and affect.  Laboratory Data: Lab Results  Component Value Date   CREATININE 1.21 12/02/2016     Pertinent Imaging: ***    Assessment & Plan:    1. History of hematuria  - work up completed 12/2016 - no malignancy seen  - Patient does not report gross hematuria  2. BPH with LU TS  - IPSS score is ***, it is stable/improving/worsening  - Continue conservative management, avoiding bladder irritants and timed voiding's  - most bothersome symptoms is/are ***  - Initiate  alpha-blocker (***), discussed side effects ***  - Initiate 5 alpha reductase inhibitor (***), discussed side effects ***  - Continue tamsulosin 0.4 mg daily, alfuzosin 10 mg daily, Rapaflo 8 mg daily, terazosin, doxazosin, Cialis 5 mg daily and finasteride 5 mg daily, dutasteride 0.5 mg daily***:refills given  - Cannot tolerate medication or medication failure, schedule cystoscopy ***  - RTC in *** months for IPSS, PSA, PVR and exam   3. Bilateral renal cysts  - Benign  4. Bladder diverticulum  -   No Follow-up on file.  These notes generated with voice recognition software. I apologize for typographical errors.  Zara Council, [facility]  [address] [place], [place] [address] [phone]

## 2017-06-30 ENCOUNTER — Ambulatory Visit: Payer: Medicare Other | Admitting: Urology

## 2017-10-10 ENCOUNTER — Ambulatory Visit: Payer: Medicare Other | Admitting: Urology

## 2017-11-23 NOTE — Progress Notes (Deleted)
11/27/2017 4:07 PM   Jacob [name] Nov 04, 1934 [id number]  Referring provider: [name], MD [facility] [place], Jacob [name] Jacob [name]  No chief complaint on file.   HPI: Patient is a 83 -year-old [place] American male with a history of gross hematuria, BPH with LU TS, bladder diverticulum and bilateral renal cysts who presents today for a  presents today as a referral from their PCP, Jacob [name], for gross hematuria.    History of hematuria Patient had an episode of gross hematuria about one months.  Then one week ago, the gross hematuria lasted two days and then cleared up.  Patient does have a prior history of hematuria while living in [place] approximately ten years ago and was told it was due to stones.  Reviewing his records, in 2008 he was evaluated for gross hematuria by Dr. Maryan [name]. CT urogram at that time noted multiple bilateral cysts as described above involving the kidneys.  There does not appear to be evidence of hydronephrosis or calculi nor enhancing masses.  Prostatic enlargement as described above with indentation along the urinary bladder. Bladder diverticulum.   He states that he had a TARGIS at that time.  He does not have a prior history of recurrent urinary tract infections, nephrolithiasis, trauma to the genitourinary tract, BPH or malignancies of the genitourinary tract.  He does not have a family medical history of nephrolithiasis, malignancies of the genitourinary tract or hematuria.   He is a smoker.   He has worked with [employer].   He completed a CTU on 12/21/2016 noted no acute process or explanation for hematuria. Suboptimal evaluation of the urinary bladder.  Innumerable bilateral renal cysts with probable complex cysts as detailed above. Suspected ischemic insult involving the medial interpolar left kidney, given stability since 2008.  Prostatomegaly with median lobe enlargement and impression into the urinary bladder.   Cholelithiasis.  Aortic atherosclerosis. Bilateral common iliac artery ectasia.  Cystoscopy performed on 12/30/2016 with Jacob [name] noted enlarged prostate with trilobar coaptation with somewhat irregular prostate contour consistent s/p previous BPH surgery, friable mucosa, ~6 cm prostatic length   BPH WITH LUTS  (prostate and/or bladder) His IPSS score today is ***, which is *** lower urinary tract symptomatology. He is *** with his quality life due to his urinary symptoms. His PVR is *** mL.  His previous IPSS score was ***.  His previous PVR is *** mL.    His major complaint today ***.  He has had these symptoms for *** years.  He denies any dysuria, hematuria or suprapubic pain.   He currently taking ***.  His has had ***.  Previous PSA's:     He also denies any recent fevers, chills, nausea or vomiting.  He has a family history of PCa, with ***.   He does not have a family history of PCa.***    Score:  1-7 Mild 8-19 Moderate 20-35 Severe   PMH: Past Medical History:  Diagnosis Date  . COPD (chronic obstructive pulmonary disease) (Jacob [name])    pt denies.  . Hypertension   . Swelling of lower extremity    bilateral  . Wears dentures    full upper and lower - do not fit well.    Surgical History: Past Surgical History:  Procedure Laterality Date  . CATARACT EXTRACTION W/PHACO Right 09/30/2015   Procedure: CATARACT EXTRACTION PHACO AND INTRAOCULAR LENS PLACEMENT ([place]);  Surgeon: Leandrew Koyanagi, MD;  Location: [place];  Service: Ophthalmology;  Laterality: Right;  . COLONOSCOPY    . HERNIA REPAIR      Home Medications:  Allergies as of 11/27/2017   No Known Allergies     Medication List        Accurate as of 11/23/17  4:07 PM. Always use your most recent med list.          acetaminophen 500 MG tablet Commonly known as:  TYLENOL Take by mouth.   amLODipine 10 MG tablet Commonly known as:  NORVASC Take 10 mg by mouth daily. PM   aspirin  81 MG tablet Take 81 mg by mouth daily. PM   cyanocobalamin 500 MCG tablet Take 500 mcg by mouth daily. PM   cyclobenzaprine 10 MG tablet Commonly known as:  FLEXERIL Take 10 mg by mouth daily as needed for muscle spasms.   finasteride 5 MG tablet Commonly known as:  PROSCAR Take 1 tablet (5 mg total) by mouth daily.   hydrochlorothiazide 25 MG tablet Commonly known as:  HYDRODIURIL Take 25 mg by mouth daily. PM   metoprolol succinate 25 MG 24 hr tablet Commonly known as:  TOPROL-XL Take 25 mg by mouth 2 (two) times daily.   polyethylene glycol powder powder Commonly known as:  GLYCOLAX/MIRALAX TAKE 17G ONCE DAILY AS NEEDED FOR CONSTIPATION (ADJUST AMOUNT AS NEEDED) MIX IN 4-8 OZ OF FLUID       Allergies: No Known Allergies  Family History: Family History  Problem Relation Age of Onset  . Prostate cancer Brother   . Kidney cancer Neg Hx   . Kidney disease Neg Hx   . Bladder Cancer Neg Hx     Social History:  reports that he has been smoking cigarettes.  He has a 22.50 pack-year smoking history. he has never used smokeless tobacco. He reports that he does not drink alcohol or use drugs.  ROS:                                        Physical Exam: There were no vitals taken for this visit.  Constitutional: Well nourished. Alert and oriented, No acute distress. HEENT: [place] AT, moist mucus membranes. Trachea midline, no masses. Cardiovascular: No clubbing, cyanosis, or edema. Respiratory: Normal respiratory effort, no increased work of breathing. GI: Abdomen is soft, non tender, non distended, no abdominal masses. Liver and spleen not palpable.  No hernias appreciated.  Stool sample for occult testing is not indicated.   GU: No CVA tenderness.  No bladder fullness or masses.  Patient with uncircumcised phallus. Foreskin easily retracted  Urethral meatus is patent.  No penile discharge. No penile lesions or rashes. Scrotum without lesions, cysts,  rashes and/or edema. Vitiligo is present on shaft and scotum.   Testicles are located scrotally bilaterally. No masses are appreciated in the testicles. Left and right epididymis are normal. Rectal: Patient with  normal sphincter tone. Anus and perineum without scarring or rashes. No rectal masses are appreciated. Prostate is approximately 50 grams, no nodules are appreciated. Seminal vesicles are normal. Skin: No rashes, bruises or suspicious lesions. Lymph: No cervical or inguinal adenopathy. Neurologic: Grossly intact, no focal deficits, moving all 4 extremities. Psychiatric: Normal mood and affect.  Laboratory Data: Lab Results  Component Value Date   CREATININE 1.21 12/02/2016   I have reviewed the labs.  Pertinent Imaging: PRIOR REPORT IMPORTED FROM THE [facility] WORKFLOW SYSTEM   REASON FOR EXAM:  diverticulum   hematuria  COMMENTS:   PROCEDURE:     CT  - CT ABDOMEN / PELVIS  W/WO  - Nov 02 2007  9:29AM   RESULT:     Helical 5 mm sections were obtained from the lung bases  through  the pubic symphysis pre, immediate and delay intravenous administration of  85 mm of Isovue-370.   Evaluation of the lung bases demonstrates mild interstitial changes and  otherwise grossly unremarkable.   Evaluation of the RIGHT and LEFT kidneys demonstrates bilateral multiple  nonenhancing cysts. The largest on the RIGHT measures 9.31 x 8.55 cm in AP  by transverse dimensions and on the LEFT 6.77 x 6.22 cm.  The largest on  the  LEFT is in the lower pole and on the RIGHT the anterior lower pole. There  does not appear to be evidence of enhancing masses or hydronephrosis  within  the RIGHT or LEFT kidneys nor calculi. The liver, spleen, adrenals and  pancreas are unremarkable. There does not appear to be evidence of  abdominal  or pelvic free fluid, drainable loculated fluid collections, masses or  adenopathy. There is no CT evidence of an abdominal aortic aneurysm. The  celiac, SMA,  SMV and IMA are opacified. Contrast is appreciated within the  portal vein though there are areas of decreased attenuation which may  represent a mixing of noncontrasted and contrasted blood. Incidental note  is  made of multiple gallstones within the gallbladder.   Evaluation of the pelvis demonstrates a bladder diverticulum along the  posterolateral aspect of the urinary bladder on the RIGHT. The prostate is  enlarged measuring 5.03 x 6.15 cm in maximal AP by transverse dimensions.  There appears to be indentation along the base of the bladder.   IMPRESSION:   1.Multiple bilateral cysts as described above involving the kidneys. There  does not appear to be evidence of hydronephrosis or calculi nor enhancing  masses.  2. Prostatic enlargement as described above with indentation along the  urinary bladder.  3.Bladder diverticulum.   Thank you for the opportunity to contribute to the care of your patient.      Assessment & Plan:    1. History of hematuria               -hematuria work up completed in 12/2016 with CTU and cystoscopy - no worrisome findings              -Large median lobe likely cause of bleeding             -started on finasteride 5 mg 6 months ago  2. BPH with LUTS  - IPSS score is ***, it is stable/improving/worsening  - Continue conservative management, avoiding bladder irritants and timed voiding's  - most bothersome symptoms is/are ***  - Initiate alpha-blocker (***), discussed side effects ***  - Initiate 5 alpha reductase inhibitor (***), discussed side effects ***  - Continue tamsulosin 0.4 mg daily, alfuzosin 10 mg daily, Rapaflo 8 mg daily, terazosin, doxazosin, Cialis 5 mg daily and finasteride 5 mg daily, dutasteride 0.5 mg daily***:refills given  - Cannot tolerate medication or medication failure, schedule cystoscopy ***  - RTC in *** months for IPSS, PSA, PVR and exam  3. Renal cysts, acquired, bilateral Large asymptomatic renal cysts  bilaterally, simple Cr 1.21  4. Bladder diverticulum Small asymptomatic   No Follow-up on file.  These notes generated with voice recognition software. I apologize for typographical errors.  Lassie Demorest, PA-C  [facility] [address] [place],  [address] [phone]

## 2017-11-27 ENCOUNTER — Ambulatory Visit: Payer: Medicare Other | Admitting: Urology

## 2017-11-27 ENCOUNTER — Encounter: Payer: Self-pay | Admitting: Urology

## 2018-01-29 ENCOUNTER — Telehealth: Payer: Self-pay | Admitting: Urology

## 2018-01-29 MED ORDER — FINASTERIDE 5 MG PO TABS: 5.0000 mg | ORAL_TABLET | Freq: Every day | ORAL | 0 refills | Status: DC

## 2018-01-29 NOTE — Telephone Encounter (Signed)
30 days and no refills given as pt has not been seen since 12/2016.

## 2018-01-29 NOTE — Telephone Encounter (Signed)
Pt states pharmacy has not refilled his Rx, pt does not know what rx he is out of, states it is prostate medication, asking if the Dr. Does not want him to take it anymore since the pharmacy will not refill it.  Pt is out of medication and wants to know what to do. Pllease advise. Thanks.

## 2018-02-02 DIAGNOSIS — N182 Chronic kidney disease, stage 2 (mild): Secondary | ICD-10-CM | POA: Insufficient documentation

## 2018-02-27 NOTE — Progress Notes (Signed)
02/28/2018 9:55 AM   Jacob [name] 02-06-1934 [id number]  Referring provider: [name], MD [facility] [place], [place] [address]  No chief complaint on file.   HPI: Patient is an 84 year old African-American male with a history of hematuria, BPH with LU TS, bilateral renal cysts and bladder diverticulum who presents today for a one year follow up.  History of hematuria He completed a hematuria workup in January 2018 with CTU and cystoscopy.  Findings were positive for a large median lobe of the prostate, benign renal cysts and a small bladder diverticulum.  He does not report gross hematuria at this time.    BPH WITH LUTS  (prostate and/or bladder) IPSS score: 6/1     Major complaint(s): post void dribbling x few months. Denies any dysuria, hematuria or suprapubic pain.   Currently taking: Finasteride 5 mg daily.  His has had TARGIS in 2008.  Marland Kitchen   Denies any recent fevers, chills, nausea or vomiting.    IPSS    Row Name 02/28/18 0900         International Prostate Symptom Score   How often have you had the sensation of not emptying your bladder?  Less than 1 in 5     How often have you had to urinate less than every two hours?  Less than 1 in 5 times     How often have you found you stopped and started again several times when you urinated?  Not at All     How often have you found it difficult to postpone urination?  Less than 1 in 5 times     How often have you had a weak urinary stream?  Less than 1 in 5 times     How often have you had to strain to start urination?  Not at All     How many times did you typically get up at night to urinate?  2 Times     Total IPSS Score  6       Quality of Life due to urinary symptoms   If you were to spend the rest of your life with your urinary condition just the way it is now how would you feel about that?  Pleased        Score:  1-7 Mild 8-19 Moderate 20-35 Severe  Renal cysts Benign.   Cr  1.9  Bladder diverticulum Small.  Asymptomatic.    PMH: Past Medical History:  Diagnosis Date  . COPD (chronic obstructive pulmonary disease) ([place])    pt denies.  . Hypertension   . Swelling of lower extremity    bilateral  . Wears dentures    full upper and lower - do not fit well.    Surgical History: Past Surgical History:  Procedure Laterality Date  . CATARACT EXTRACTION W/PHACO Right 09/30/2015   Procedure: CATARACT EXTRACTION PHACO AND INTRAOCULAR LENS PLACEMENT ([place]);  Surgeon: Leandrew Koyanagi, MD;  Location: [place];  Service: Ophthalmology;  Laterality: Right;  . COLONOSCOPY    . HERNIA REPAIR      Home Medications:  Allergies as of 02/28/2018   No Known Allergies     Medication List        Accurate as of 02/28/18  9:55 AM. Always use your most recent med list.          acetaminophen 500 MG tablet Commonly known as:  TYLENOL Take by mouth.   amLODipine 10 MG tablet  Commonly known as:  NORVASC Take 10 mg by mouth daily. PM   aspirin 81 MG tablet Take 81 mg by mouth daily. PM   cyanocobalamin 500 MCG tablet Take 500 mcg by mouth daily. PM   cyclobenzaprine 10 MG tablet Commonly known as:  FLEXERIL Take 10 mg by mouth daily as needed for muscle spasms.   finasteride 5 MG tablet Commonly known as:  PROSCAR Take 1 tablet (5 mg total) by mouth daily.   hydrochlorothiazide 25 MG tablet Commonly known as:  HYDRODIURIL Take 25 mg by mouth daily. PM   metoprolol succinate 25 MG 24 hr tablet Commonly known as:  TOPROL-XL Take 25 mg by mouth 2 (two) times daily.   polyethylene glycol powder powder Commonly known as:  GLYCOLAX/MIRALAX TAKE 17G ONCE DAILY AS NEEDED FOR CONSTIPATION (ADJUST AMOUNT AS NEEDED) MIX IN 4-8 OZ OF FLUID       Allergies: No Known Allergies  Family History: Family History  Problem Relation Age of Onset  . Prostate cancer Brother   . Kidney cancer Neg Hx   . Kidney disease Neg Hx   . Bladder  Cancer Neg Hx     Social History:  reports that he has been smoking cigarettes.  He has a 22.50 pack-year smoking history. he has never used smokeless tobacco. He reports that he does not drink alcohol or use drugs.  ROS: UROLOGY Frequent Urination?: No Hard to postpone urination?: No Burning/pain with urination?: No Get up at night to urinate?: Yes Leakage of urine?: Yes Urine stream starts and stops?: No Trouble starting stream?: No Do you have to strain to urinate?: No Blood in urine?: No Urinary tract infection?: No Sexually transmitted disease?: No Injury to kidneys or bladder?: No Painful intercourse?: No Weak stream?: Yes Erection problems?: No Penile pain?: No  Gastrointestinal Nausea?: No Vomiting?: No Indigestion/heartburn?: No Diarrhea?: No Constipation?: Yes  Constitutional Fever: No Night sweats?: No Weight loss?: No Fatigue?: No  Skin Skin rash/lesions?: No Itching?: No  Eyes Blurred vision?: No Double vision?: Yes  Ears/Nose/Throat Sore throat?: No Sinus problems?: No  Hematologic/Lymphatic Swollen glands?: No Easy bruising?: No  Cardiovascular Leg swelling?: No Chest pain?: No  Respiratory Cough?: No Shortness of breath?: Yes  Endocrine Excessive thirst?: Yes  Musculoskeletal Back pain?: Yes Joint pain?: Yes  Neurological Headaches?: No Dizziness?: No  Psychologic Depression?: No Anxiety?: No  Physical Exam: BP (!) 146/95   Pulse 62   Resp 16   Ht 5' 5.5" (1.664 m)   Wt 134 lb 6.4 oz (61 kg)   SpO2 98%   BMI 22.03 kg/m   Constitutional: Well nourished. Alert and oriented, No acute distress. HEENT: [place] AT, moist mucus membranes. Trachea midline, no masses. Cardiovascular: No clubbing, cyanosis, or edema. Respiratory: Normal respiratory effort, no increased work of breathing. GI: Abdomen is soft, non tender, non distended, no abdominal masses. Liver and spleen not palpable.  No hernias appreciated.  Stool sample  for occult testing is not indicated.   GU: No CVA tenderness.  No bladder fullness or masses.  Patient with uncircumcised phallus.  Foreskin easily retracted  Urethral meatus is patent.  No penile discharge. No penile lesions or rashes. Scrotum without lesions, cysts, rashes and/or edema.  Testicles are located scrotally bilaterally. No masses are appreciated in the testicles. Left and right epididymis are normal.  Right inguinal hernia.   Rectal: Patient with  normal sphincter tone. Anus and perineum without scarring or rashes. No rectal masses are appreciated. Prostate is  approximately 50 grams, no nodules are appreciated. Seminal vesicles are normal. Skin: No rashes, bruises or suspicious lesions. Lymph: No cervical or inguinal adenopathy. Neurologic: Grossly intact, no focal deficits, moving all 4 extremities. Psychiatric: Normal mood and affect.    Laboratory Data: Lab Results  Component Value Date   WBC 5.9 11/17/2012   HGB 15.9 11/17/2012   HCT 48.8 11/17/2012   MCV 89 11/17/2012   PLT 305 11/17/2012    Lab Results  Component Value Date   CREATININE 1.21 12/02/2016    Lab Results  Component Value Date   TSH 0.653 11/18/2012       Component Value Date/Time   CHOL 126 11/18/2012 0451   HDL 59 11/18/2012 0451   VLDL 16 11/18/2012 0451   LDLCALC 51 11/18/2012 0451    Lab Results  Component Value Date   AST 25 11/17/2012   Lab Results  Component Value Date   ALT 17 11/17/2012    Urinalysis    Component Value Date/Time   COLORURINE YELLOW 12/30/2016 1525   APPEARANCEUR CLEAR 12/30/2016 1525   APPEARANCEUR Clear 11/17/2012 1216   LABSPEC 1.020 12/30/2016 1525   LABSPEC 1.011 11/17/2012 1216   PHURINE 7.0 12/30/2016 1525   GLUCOSEU NEGATIVE 12/30/2016 1525   GLUCOSEU Negative 11/17/2012 1216   HGBUR TRACE (A) 12/30/2016 1525   BILIRUBINUR SMALL (A) 12/30/2016 1525   BILIRUBINUR Negative 11/17/2012 1216   KETONESUR NEGATIVE 12/30/2016 1525   PROTEINUR TRACE  (A) 12/30/2016 1525   NITRITE NEGATIVE 12/30/2016 1525   LEUKOCYTESUR NEGATIVE 12/30/2016 1525   LEUKOCYTESUR Negative 11/17/2012 1216   I have reviewed the labs.  Assessment & Plan:    1. History of hematuria  - hematuria work up completed in 12/2016 - no worrisome findings  - no reports of gross hematuria  2. BPH with LUTS  - IPSS score is 6/1  - Continue conservative management, avoiding bladder irritants and timed voiding's  - most bothersome symptoms is/are post void dribbling  - Continue finasteride 5 mg daily; refills given  - RTC in 12 months for IPSS, PSA and exam   3. Bilateral renal cysts  - benign  4. Bladder diverticulum  - not bothersome  Return in about 1 year (around 03/01/2019) for I PSS and exam.  These notes generated with voice recognition software. I apologize for typographical errors.  Zara Council, [facility] [address] [place], [place] [address] [phone]

## 2018-02-28 ENCOUNTER — Encounter: Payer: Self-pay | Admitting: Urology

## 2018-02-28 ENCOUNTER — Other Ambulatory Visit: Payer: Self-pay | Admitting: Urology

## 2018-02-28 ENCOUNTER — Ambulatory Visit (INDEPENDENT_AMBULATORY_CARE_PROVIDER_SITE_OTHER): Payer: Medicare Other | Admitting: Urology

## 2018-02-28 VITALS — BP 146/95 | HR 62 | Resp 16 | Ht 65.5 in | Wt 134.4 lb

## 2018-02-28 DIAGNOSIS — N281 Cyst of kidney, acquired: Secondary | ICD-10-CM | POA: Diagnosis not present

## 2018-02-28 DIAGNOSIS — N138 Other obstructive and reflux uropathy: Secondary | ICD-10-CM | POA: Diagnosis not present

## 2018-02-28 DIAGNOSIS — K4090 Unilateral inguinal hernia, without obstruction or gangrene, not specified as recurrent: Secondary | ICD-10-CM

## 2018-02-28 DIAGNOSIS — N401 Enlarged prostate with lower urinary tract symptoms: Secondary | ICD-10-CM | POA: Diagnosis not present

## 2018-02-28 DIAGNOSIS — Z87448 Personal history of other diseases of urinary system: Secondary | ICD-10-CM | POA: Diagnosis not present

## 2018-02-28 DIAGNOSIS — N323 Diverticulum of bladder: Secondary | ICD-10-CM

## 2018-02-28 MED ORDER — FINASTERIDE 5 MG PO TABS: 5.0000 mg | ORAL_TABLET | Freq: Every day | ORAL | 3 refills | Status: DC

## 2018-03-06 ENCOUNTER — Other Ambulatory Visit: Payer: Self-pay

## 2018-03-06 MED ORDER — FINASTERIDE 5 MG PO TABS: 5.0000 mg | ORAL_TABLET | Freq: Every day | ORAL | 3 refills | Status: DC

## 2018-03-14 ENCOUNTER — Encounter: Payer: Self-pay | Admitting: *Deleted

## 2018-03-16 ENCOUNTER — Encounter: Payer: Self-pay | Admitting: *Deleted

## 2018-04-17 ENCOUNTER — Encounter: Payer: Self-pay | Admitting: General Surgery

## 2018-04-17 ENCOUNTER — Ambulatory Visit: Payer: Medicare Other | Admitting: General Surgery

## 2018-04-17 VITALS — BP 136/86 | HR 80 | Resp 14 | Ht 65.5 in | Wt 136.0 lb

## 2018-04-17 DIAGNOSIS — K4090 Unilateral inguinal hernia, without obstruction or gangrene, not specified as recurrent: Secondary | ICD-10-CM

## 2018-04-17 NOTE — Patient Instructions (Addendum)
  He will call when he decides to have surgery or he has pain or it gets larger.  Inguinal Hernia, Adult An inguinal hernia is when fat or the intestines push through the area where the leg meets the lower belly (groin) and make a rounded lump (bulge). This condition happens over time. There are three types of inguinal hernias. These types include:  Hernias that can be pushed back into the belly (are reducible).  Hernias that cannot be pushed back into the belly (are incarcerated).  Hernias that cannot be pushed back into the belly and lose their blood supply (get strangulated). This type needs emergency surgery.  Follow these instructions at home: Lifestyle  Drink enough fluid to keep your urine (pee) clear or pale yellow.  Eat plenty of fruits, vegetables, and whole grains. These have a lot of fiber. Talk with your doctor if you have questions.  Avoid lifting heavy objects.  Avoid standing for long periods of time.  Do not use tobacco products. These include cigarettes, chewing tobacco, or e-cigarettes. If you need help quitting, ask your doctor.  Try to stay at a healthy weight. General instructions  Do not try to force the hernia back in.  Watch your hernia for any changes in color or size. Let your doctor know if there are any changes.  Take over-the-counter and prescription medicines only as told by your doctor.  Keep all follow-up visits as told by your doctor. This is important. Contact a doctor if:  You have a fever.  You have new symptoms.  Your symptoms get worse. Get help right away if:  The area where the legs meets the lower belly has: ? Pain that gets worse suddenly. ? A bulge that gets bigger suddenly and does not go down. ? A bulge that turns red or purple. ? A bulge that is painful to the touch.  You are a man and your scrotum: ? Suddenly feels painful. ? Suddenly changes in size.  You feel sick to your stomach (nauseous) and this feeling does  not go away.  You throw up (vomit) and this keeps happening.  You feel your heart beating a lot more quickly than normal.  You cannot poop (have a bowel movement) or pass gas. This information is not intended to replace advice given to you by your health care provider. Make sure you discuss any questions you have with your health care provider. Document Released: 12/29/2006 Document Revised: 05/05/2016 Document Reviewed: 10/08/2014 Elsevier Interactive Patient Education  2018 [employer].

## 2018-04-17 NOTE — Progress Notes (Signed)
Patient ID: Jacob [name], male   DOB: 27-Aug-1934, 84 y.o.   MRN: [id number]  Chief Complaint  Patient presents with  . Hernia    HPI Jacob [name] is a 84 y.o. male here today for a evaluation of a right inguinal hernia repair referred by Zara Council PA-C. He noticed a right groin knot about 2-3 years ago that has not changed. He states he has had the same one done twice in the past. No pain. Bowels move with the use of stool softener. He does admit to weight loss.  HPI  Past Medical History:  Diagnosis Date  . COPD (chronic obstructive pulmonary disease) ([facility])    pt denies.  . Hypertension   . Swelling of lower extremity    bilateral  . Wears dentures    full upper and lower - do not fit well.    Past Surgical History:  Procedure Laterality Date  . CATARACT EXTRACTION W/PHACO Right 09/30/2015   Procedure: CATARACT EXTRACTION PHACO AND INTRAOCULAR LENS PLACEMENT ([place]);  Surgeon: Leandrew Koyanagi, MD;  Location: [facility];  Service: Ophthalmology;  Laterality: Right;  . COLONOSCOPY  2009?  Marland Kitchen HERNIA REPAIR Right    x2     Family History  Problem Relation Age of Onset  . Prostate cancer Brother   . Alzheimer's disease Mother   . Stroke Father   . Kidney cancer Neg Hx   . Kidney disease Neg Hx   . Bladder Cancer Neg Hx     Social History Social History   Tobacco Use  . Smoking status: Current Every Day Smoker    Packs/day: 0.50    Years: 45.00    Pack years: 22.50    Types: Cigarettes  . Smokeless tobacco: Never Used  Substance Use Topics  . Alcohol use: No  . Drug use: No    No Known Allergies  Current Outpatient Medications  Medication Sig Dispense Refill  . acetaminophen (TYLENOL) 500 MG tablet Take 500 mg by mouth every 4 (four) hours as needed.     Marland Kitchen amLODipine (NORVASC) 10 MG tablet Take 10 mg by mouth daily. PM    . aspirin 81 MG tablet Take 81 mg by mouth daily. PM    . cyanocobalamin 500 MCG tablet Take 500 mcg by mouth daily. PM     . cyclobenzaprine (FLEXERIL) 10 MG tablet Take 10 mg by mouth daily as needed for muscle spasms.    . finasteride (PROSCAR) 5 MG tablet Take 1 tablet (5 mg total) by mouth daily. 90 tablet 3  . hydrochlorothiazide (HYDRODIURIL) 25 MG tablet Take 25 mg by mouth daily. PM    . metoprolol succinate (TOPROL-XL) 25 MG 24 hr tablet Take 25 mg by mouth 2 (two) times daily.    . polyethylene glycol powder (GLYCOLAX/MIRALAX) powder TAKE 17G ONCE DAILY AS NEEDED FOR CONSTIPATION (ADJUST AMOUNT AS NEEDED) MIX IN 4-8 OZ OF FLUID     No current facility-administered medications for this visit.     Review of Systems Review of Systems  Constitutional: Negative.   Respiratory: Negative.   Cardiovascular: Negative.     Blood pressure 136/86, pulse 80, resp. rate 14, height 5' 5.5" (1.664 m), weight 136 lb (61.7 kg), SpO2 95 %.  Physical Exam Physical Exam  Constitutional: He is oriented to person, place, and time. He appears well-developed and well-nourished.  HENT:  Mouth/Throat: No oropharyngeal exudate.  Eyes: No scleral icterus.  Neck: Neck supple.  Cardiovascular: Normal rate and normal  heart sounds. An irregular rhythm present.  Pulmonary/Chest: Effort normal and breath sounds normal.  Abdominal: Normal appearance. A hernia is present. Hernia confirmed positive in the right inguinal area.  reducible right inguinal hernia   Genitourinary:     Lymphadenopathy:    He has no cervical adenopathy.  Neurological: He is alert and oriented to person, place, and time.  Skin: Skin is warm and dry.  Psychiatric: His behavior is normal.    Data Reviewed Urology notes.  Assessment    Asymptomatic inguinal hernia.    Plan  At this time, the patient is not particularly interested in elective repair.  He reports there has been minimal if any change over the last 2-3 years.  The defect seems small enough to make incarceration unlikely.  Pros and cons of elective repair reviewed.   He will  call when he decides to have surgery or he becomes symptomatic.  Hernia precautions and incarceration were discussed with the patient. If they develop symptoms of an incarcerated hernia, they were encouraged to seek prompt medical attention.  I have recommended repair of the hernia using mesh on an outpatient basis in the near future. The risk of infection was reviewed. The role of prosthetic mesh to minimize the risk of recurrence was reviewed.    HPI, Physical Exam, Assessment and Plan have been scribed under the direction and in the presence of Jamisen Bellow, MD. Karie Fetch, RN  I have completed the exam and reviewed the above documentation for accuracy and completeness.  I agree with the above.  Haematologist has been used and any errors in dictation or transcription are unintentional.  Hervey Ard, M.D., F.A.C.S.  Forest Gleason Berenice Oehlert 04/18/2018, 5:49 PM

## 2018-06-21 DIAGNOSIS — K4090 Unilateral inguinal hernia, without obstruction or gangrene, not specified as recurrent: Secondary | ICD-10-CM | POA: Insufficient documentation

## 2018-12-28 DIAGNOSIS — E559 Vitamin D deficiency, unspecified: Secondary | ICD-10-CM | POA: Insufficient documentation

## 2019-03-06 ENCOUNTER — Ambulatory Visit: Payer: Medicare Other | Admitting: Urology

## 2019-04-12 ENCOUNTER — Ambulatory Visit: Payer: Medicare Other | Admitting: Urology

## 2019-05-30 ENCOUNTER — Other Ambulatory Visit: Payer: Self-pay

## 2019-05-30 DIAGNOSIS — N138 Other obstructive and reflux uropathy: Secondary | ICD-10-CM

## 2019-05-30 MED ORDER — FINASTERIDE 5 MG PO TABS
5.0000 mg | ORAL_TABLET | Freq: Every day | ORAL | 3 refills | Status: DC
Start: 2019-05-30 — End: 2020-05-08

## 2019-06-04 ENCOUNTER — Other Ambulatory Visit: Payer: Self-pay

## 2019-06-04 ENCOUNTER — Telehealth: Payer: Self-pay | Admitting: Urology

## 2019-06-04 ENCOUNTER — Telehealth: Payer: Medicare Other | Admitting: Urology

## 2019-06-04 NOTE — Progress Notes (Signed)
Jacob [name] stated that today was not a good day for his yearly office visit and would like to be rescheduled.

## 2019-06-04 NOTE — Telephone Encounter (Signed)
App made and mailed °

## 2019-06-04 NOTE — Telephone Encounter (Signed)
Jacob [name] stated that today was not a good day for an office visit.  Would you call him and get him scheduled for a one year follow up in three months?

## 2019-09-04 ENCOUNTER — Ambulatory Visit: Payer: Medicare Other | Admitting: Urology

## 2019-10-03 NOTE — Progress Notes (Signed)
10/04/2019 10:12 AM   Jacob [name] January 22, 1934 [id number]  Referring provider: [name], MD [facility] [place],  [place] [address]  Chief Complaint  Patient presents with  . Follow-up    HPI: Patient is an 85 year old male with a history of hematuria, BPH with LU TS, bilateral renal cysts and bladder diverticulum who presents today for a one year follow up.  History of hematuria (high risk) Smoker.  He completed a hematuria workup in January 2018 with CTU and cystoscopy.  Findings were positive for a large median lobe of the prostate, benign renal cysts and a small bladder diverticulum.  He does not report gross hematuria.  UA is negative.      BPH WITH LUTS  (prostate and/or bladder) I PSS:  3/3          PVR:  91            Previous IPSS score: 6/1     Major complaint(s): Nocturia x 3.  Denies any dysuria, hematuria or suprapubic pain.   Currently taking: Finasteride 5 mg daily.  His has had TARGIS in 2008.     Denies any recent fevers, chills, nausea or vomiting.  IPSS    Row Name 10/04/19 1000         International Prostate Symptom Score   How often have you had the sensation of not emptying your bladder?  Not at All     How often have you had to urinate less than every two hours?  Not at All     How often have you found you stopped and started again several times when you urinated?  Not at All     How often have you found it difficult to postpone urination?  Not at All     How often have you had a weak urinary stream?  Not at All     How often have you had to strain to start urination?  Not at All     How many times did you typically get up at night to urinate?  3 Times     Total IPSS Score  3       Quality of Life due to urinary symptoms   If you were to spend the rest of your life with your urinary condition just the way it is now how would you feel about that?  Mixed        Score:  1-7 Mild 8-19 Moderate 20-35 Severe   Renal cysts Benign.  Cr  1.9  Bladder diverticulum Small.  Asymptomatic.    PMH: Past Medical History:  Diagnosis Date  . COPD (chronic obstructive pulmonary disease) ([facility])    pt denies.  . Hypertension   . Swelling of lower extremity    bilateral  . Wears dentures    full upper and lower - do not fit well.    Surgical History: Past Surgical History:  Procedure Laterality Date  . CATARACT EXTRACTION W/PHACO Right 09/30/2015   Procedure: CATARACT EXTRACTION PHACO AND INTRAOCULAR LENS PLACEMENT ([place]);  Surgeon: Leandrew Koyanagi, MD;  Location: [facility];  Service: Ophthalmology;  Laterality: Right;  . COLONOSCOPY  2009?  Marland Kitchen HERNIA REPAIR Right    x2     Home Medications:  Allergies as of 10/04/2019   No Known Allergies     Medication List       Accurate as of October 04, 2019 10:12 AM. If you have any questions,  ask your nurse or doctor.        acetaminophen 500 MG tablet Commonly known as: TYLENOL Take 500 mg by mouth every 4 (four) hours as needed.   amLODipine 10 MG tablet Commonly known as: NORVASC Take 10 mg by mouth daily. PM   aspirin 81 MG tablet Take 81 mg by mouth daily. PM   clotrimazole 1 % cream Commonly known as: LOTRIMIN Apply topically.   cyclobenzaprine 10 MG tablet Commonly known as: FLEXERIL Take 10 mg by mouth daily as needed for muscle spasms.   finasteride 5 MG tablet Commonly known as: PROSCAR Take 1 tablet (5 mg total) by mouth daily.   hydrochlorothiazide 25 MG tablet Commonly known as: HYDRODIURIL Take 25 mg by mouth daily. PM   metoprolol succinate 25 MG 24 hr tablet Commonly known as: TOPROL-XL Take 25 mg by mouth 2 (two) times daily.   polyethylene glycol powder 17 GM/SCOOP powder Commonly known as: GLYCOLAX/MIRALAX TAKE 17G ONCE DAILY AS NEEDED FOR CONSTIPATION (ADJUST AMOUNT AS NEEDED) MIX IN 4-8 OZ OF FLUID   Systane Balance 0.6 % Soln Generic drug: Propylene Glycol INSTILL 3 5 TIMES A DAY IN EACH  EYE AS NEEDED FOR DRYNESS.   tamsulosin 0.4 MG Caps capsule Commonly known as: FLOMAX Take 1 capsule (0.4 mg total) by mouth daily. Started by: Zara Council, PA-C   vitamin B-12 500 MCG tablet Commonly known as: CYANOCOBALAMIN Take 500 mcg by mouth daily. PM       Allergies: No Known Allergies  Family History: Family History  Problem Relation Age of Onset  . Prostate cancer Brother   . Alzheimer's disease Mother   . Stroke Father   . Kidney cancer Neg Hx   . Kidney disease Neg Hx   . Bladder Cancer Neg Hx     Social History:  reports that he has been smoking cigarettes. He has a 22.50 pack-year smoking history. He has never used smokeless tobacco. He reports that he does not drink alcohol or use drugs.  ROS: UROLOGY Frequent Urination?: No Hard to postpone urination?: No Burning/pain with urination?: No Get up at night to urinate?: Yes Leakage of urine?: Yes Urine stream starts and stops?: No Trouble starting stream?: No Do you have to strain to urinate?: No Blood in urine?: No Urinary tract infection?: No Sexually transmitted disease?: No Injury to kidneys or bladder?: No Painful intercourse?: No Weak stream?: No Erection problems?: No Penile pain?: No  Gastrointestinal Nausea?: No Vomiting?: No Indigestion/heartburn?: No Diarrhea?: No Constipation?: No  Constitutional Fever: No Night sweats?: No Weight loss?: No Fatigue?: No  Skin Skin rash/lesions?: No Itching?: No  Eyes Blurred vision?: No Double vision?: No  Ears/Nose/Throat Sore throat?: No Sinus problems?: No  Hematologic/Lymphatic Swollen glands?: No Easy bruising?: No  Cardiovascular Leg swelling?: No Chest pain?: No  Respiratory Cough?: No Shortness of breath?: No  Endocrine Excessive thirst?: No  Musculoskeletal Back pain?: No Joint pain?: No  Neurological Headaches?: No Dizziness?: No  Psychologic Depression?: No Anxiety?: No  Physical Exam: BP 127/85    Pulse 91   Ht 5' 5.5" (1.664 m)   Wt 130 lb (59 kg)   BMI 21.30 kg/m   Constitutional:  Well nourished. Alert and oriented, No acute distress. HEENT: [place] AT, moist mucus membranes.  Trachea midline, no masses. Cardiovascular: No clubbing, cyanosis, or edema. Respiratory: Normal respiratory effort, no increased work of breathing. GI: Abdomen is soft, non tender, non distended, no abdominal masses. Liver and spleen not palpable.  No hernias appreciated.  Stool sample for occult testing is not indicated.   GU: No CVA tenderness.  No bladder fullness or masses.  Patient with uncircumcised phallus.   Foreskin easily retracted  Vitiligo is present.  Urethral meatus is patent. No penile discharge. No penile lesions or rashes. Scrotum without lesions, cysts, rashes and/or edema.  Testicles are located scrotally bilaterally. No masses are appreciated in the testicles. Left and right epididymis are normal. Rectal: Not performed at this appointment. Skin: No rashes, bruises or suspicious lesions. Lymph: No cervical or inguinal adenopathy. Neurologic: Grossly intact, no focal deficits, moving all 4 extremities. Psychiatric: Normal mood and affect.   Laboratory Data: Lab Results  Component Value Date   WBC 5.9 11/17/2012   HGB 15.9 11/17/2012   HCT 48.8 11/17/2012   MCV 89 11/17/2012   PLT 305 11/17/2012    Lab Results  Component Value Date   CREATININE 1.21 12/02/2016    Lab Results  Component Value Date   TSH 0.653 11/18/2012       Component Value Date/Time   CHOL 126 11/18/2012 0451   HDL 59 11/18/2012 0451   VLDL 16 11/18/2012 0451   LDLCALC 51 11/18/2012 0451    Lab Results  Component Value Date   AST 25 11/17/2012   Lab Results  Component Value Date   ALT 17 11/17/2012    Urinalysis Component     Latest Ref Rng & Units 10/04/2019  Specific Gravity, UA     1.005 - 1.030 1.025  pH, UA     5.0 - 7.5 6.0  Color, UA     Yellow Yellow  Appearance Ur     Clear  Clear  Leukocytes,UA     Negative Negative  Protein,UA     Negative/Trace Negative  Glucose, UA     Negative Negative  Ketones, UA     Negative Negative  RBC, UA     Negative Negative  Bilirubin, UA     Negative Negative  Urobilinogen, Ur     0.2 - 1.0 mg/dL 2.0 (H)  Nitrite, UA     Negative Negative  Microscopic Examination      See below:   Component     Latest Ref Rng & Units 10/04/2019  WBC, UA     0 - 5 /hpf 0-5  RBC     0 - 2 /hpf None seen  Epithelial Cells (non renal)     0 - 10 /hpf 0-10  Bacteria, UA     None seen/Few None seen   I have reviewed the labs.  Assessment & Plan:    1. History of hematuria  - hematuria work up completed in 12/2016 - no worrisome findings  - no reports of gross hematuria  - UA today is negative for hematuria   2. BPH with LUTS  - IPSS score is 3/3, it worse  - Continue conservative management, avoiding bladder irritants and timed voiding's  - most bothersome symptoms is/are post void dribbling  - add tamsulosin 0.4 mg   - Continue finasteride 5 mg daily; refills given  - RTC in 1 months for I PSS and PVR   3. Bilateral renal cysts  - benign  4. Bladder diverticulum  - not bothersome  Return in about 1 month (around 11/04/2019) for IPSS and PVR.  These notes generated with voice recognition software. I apologize for typographical errors.  Zara Council, [place] [address] [place],  [place] [address] [phone]

## 2019-10-04 ENCOUNTER — Other Ambulatory Visit: Payer: Self-pay

## 2019-10-04 ENCOUNTER — Ambulatory Visit (INDEPENDENT_AMBULATORY_CARE_PROVIDER_SITE_OTHER): Payer: Medicare Other | Admitting: Urology

## 2019-10-04 ENCOUNTER — Encounter: Payer: Self-pay | Admitting: Urology

## 2019-10-04 VITALS — BP 127/85 | HR 91 | Ht 65.5 in | Wt 130.0 lb

## 2019-10-04 DIAGNOSIS — N401 Enlarged prostate with lower urinary tract symptoms: Secondary | ICD-10-CM | POA: Diagnosis not present

## 2019-10-04 DIAGNOSIS — N281 Cyst of kidney, acquired: Secondary | ICD-10-CM

## 2019-10-04 DIAGNOSIS — L80 Vitiligo: Secondary | ICD-10-CM | POA: Insufficient documentation

## 2019-10-04 DIAGNOSIS — N323 Diverticulum of bladder: Secondary | ICD-10-CM | POA: Diagnosis not present

## 2019-10-04 DIAGNOSIS — Z87448 Personal history of other diseases of urinary system: Secondary | ICD-10-CM

## 2019-10-04 DIAGNOSIS — N138 Other obstructive and reflux uropathy: Secondary | ICD-10-CM

## 2019-10-04 LAB — URINALYSIS, COMPLETE
Bilirubin, UA: NEGATIVE
Glucose, UA: NEGATIVE
Ketones, UA: NEGATIVE
Leukocytes,UA: NEGATIVE
Nitrite, UA: NEGATIVE
Protein,UA: NEGATIVE
RBC, UA: NEGATIVE
Specific Gravity, UA: 1.025 (ref 1.005–1.030)
Urobilinogen, Ur: 2 mg/dL — ABNORMAL HIGH (ref 0.2–1.0)
pH, UA: 6 (ref 5.0–7.5)

## 2019-10-04 LAB — MICROSCOPIC EXAMINATION
Bacteria, UA: NONE SEEN
RBC: NONE SEEN /hpf (ref 0–2)

## 2019-10-04 MED ORDER — TAMSULOSIN HCL 0.4 MG PO CAPS: 0.4000 mg | ORAL_CAPSULE | Freq: Every day | ORAL | 3 refills | Status: DC

## 2019-11-11 NOTE — Progress Notes (Deleted)
11/12/2019 1:08 PM   Jacob [name] 1934-11-17 [id number]  Referring provider: [name], MD [facility] [place],  [place] [address]  No chief complaint on file.   HPI: Patient is an 85 year old male with a history of hematuria, BPH with LU TS, bilateral renal cysts and bladder diverticulum who presents today for a one year follow up.  History of hematuria (high risk) Smoker.  He completed a hematuria workup in January 2018 with CTU and cystoscopy.  Findings were positive for a large median lobe of the prostate, benign renal cysts and a small bladder diverticulum.  ***  BPH WITH LUTS  (prostate and/or bladder) I PSS: ***  PVR:  ***        Previous IPSS score:  3/3    Major complaint(s): Nocturia x 3.  Denies any dysuria, hematuria or suprapubic pain.   Currently taking: Finasteride 5 mg daily.  His has had TARGIS in 2008.     Denies any recent fevers, chills, nausea or vomiting.    Score:  1-7 Mild 8-19 Moderate 20-35 Severe    PMH: Past Medical History:  Diagnosis Date  . COPD (chronic obstructive pulmonary disease) ([place])    pt denies.  . Hypertension   . Swelling of lower extremity    bilateral  . Wears dentures    full upper and lower - do not fit well.    Surgical History: Past Surgical History:  Procedure Laterality Date  . CATARACT EXTRACTION W/PHACO Right 09/30/2015   Procedure: CATARACT EXTRACTION PHACO AND INTRAOCULAR LENS PLACEMENT ([facility]);  Surgeon: Leandrew Koyanagi, MD;  Location: [facility];  Service: Ophthalmology;  Laterality: Right;  . COLONOSCOPY  2009?  Marland Kitchen HERNIA REPAIR Right    x2     Home Medications:  Allergies as of 11/12/2019   No Known Allergies     Medication List       Accurate as of November 11, 2019  1:08 PM. If you have any questions, ask your nurse or doctor.        acetaminophen 500 MG tablet Commonly known as: TYLENOL Take 500 mg by mouth every 4 (four) hours as needed.  amLODipine 10 MG tablet Commonly known as: NORVASC Take 10 mg by mouth daily. PM   aspirin 81 MG tablet Take 81 mg by mouth daily. PM   clotrimazole 1 % cream Commonly known as: LOTRIMIN Apply topically.   cyclobenzaprine 10 MG tablet Commonly known as: FLEXERIL Take 10 mg by mouth daily as needed for muscle spasms.   finasteride 5 MG tablet Commonly known as: PROSCAR Take 1 tablet (5 mg total) by mouth daily.   hydrochlorothiazide 25 MG tablet Commonly known as: HYDRODIURIL Take 25 mg by mouth daily. PM   metoprolol succinate 25 MG 24 hr tablet Commonly known as: TOPROL-XL Take 25 mg by mouth 2 (two) times daily.   polyethylene glycol powder 17 GM/SCOOP powder Commonly known as: GLYCOLAX/MIRALAX TAKE 17G ONCE DAILY AS NEEDED FOR CONSTIPATION (ADJUST AMOUNT AS NEEDED) MIX IN 4-8 OZ OF FLUID   Systane Balance 0.6 % Soln Generic drug: Propylene Glycol INSTILL 3 5 TIMES A DAY IN EACH EYE AS NEEDED FOR DRYNESS.   tamsulosin 0.4 MG Caps capsule Commonly known as: FLOMAX Take 1 capsule (0.4 mg total) by mouth daily.   vitamin B-12 500 MCG tablet Commonly known as: CYANOCOBALAMIN Take 500 mcg by mouth daily. PM       Allergies: No Known Allergies  Family  History: Family History  Problem Relation Age of Onset  . Prostate cancer Brother   . Alzheimer's disease Mother   . Stroke Father   . Kidney cancer Neg Hx   . Kidney disease Neg Hx   . Bladder Cancer Neg Hx     Social History:  reports that he has been smoking cigarettes. He has a 22.50 pack-year smoking history. He has never used smokeless tobacco. He reports that he does not drink alcohol or use drugs.  ROS:                                        Physical Exam: There were no vitals taken for this visit.  Constitutional:  Well nourished. Alert and oriented, No acute distress. HEENT: [place] AT, moist mucus membranes.  Trachea midline, no masses. Cardiovascular: No clubbing, cyanosis,  or edema. Respiratory: Normal respiratory effort, no increased work of breathing. GI: Abdomen is soft, non tender, non distended, no abdominal masses. Liver and spleen not palpable.  No hernias appreciated.  Stool sample for occult testing is not indicated.   GU: No CVA tenderness.  No bladder fullness or masses.  Patient with circumcised/uncircumcised phallus. ***Foreskin easily retracted***  Urethral meatus is patent.  No penile discharge. No penile lesions or rashes. Scrotum without lesions, cysts, rashes and/or edema.  Testicles are located scrotally bilaterally. No masses are appreciated in the testicles. Left and right epididymis are normal. Rectal: Patient with  normal sphincter tone. Anus and perineum without scarring or rashes. No rectal masses are appreciated. Prostate is approximately *** grams, *** nodules are appreciated. Seminal vesicles are normal. Skin: No rashes, bruises or suspicious lesions. Lymph: No cervical or inguinal adenopathy. Neurologic: Grossly intact, no focal deficits, moving all 4 extremities. Psychiatric: Normal mood and affect.   Laboratory Data: Lab Results  Component Value Date   WBC 5.9 11/17/2012   HGB 15.9 11/17/2012   HCT 48.8 11/17/2012   MCV 89 11/17/2012   PLT 305 11/17/2012    Lab Results  Component Value Date   CREATININE 1.21 12/02/2016    Lab Results  Component Value Date   TSH 0.653 11/18/2012       Component Value Date/Time   CHOL 126 11/18/2012 0451   HDL 59 11/18/2012 0451   VLDL 16 11/18/2012 0451   LDLCALC 51 11/18/2012 0451    Lab Results  Component Value Date   AST 25 11/17/2012   Lab Results  Component Value Date   ALT 17 11/17/2012    Urinalysis ***  I have reviewed the labs.  Assessment & Plan:    1. History of hematuria  - hematuria work up completed in 12/2016 - no worrisome findings  - no reports of gross hematuria  - UA today is negative for hematuria   2. BPH with LUTS  - IPSS score is 3/3, it  worse  - Continue conservative management, avoiding bladder irritants and timed voiding's  - most bothersome symptoms is/are post void dribbling  - add tamsulosin 0.4 mg   - Continue finasteride 5 mg daily; refills given  - RTC in 1 months for I PSS and PVR   3. Bilateral renal cysts  - benign  4. Bladder diverticulum  - not bothersome  No follow-ups on file.  These notes generated with voice recognition software. I apologize for typographical errors.  Royden Purl  [place]  [address] [place],  [address] [phone]

## 2019-11-12 ENCOUNTER — Ambulatory Visit: Payer: Medicare Other | Admitting: Urology

## 2019-12-24 NOTE — Progress Notes (Signed)
12/25/2019 11:09 AM   Jacob [name] 30-Dec-1933 [id number]  Referring provider: [name], MD [facility] [place],  [place] [address]  Chief Complaint  Patient presents with  . Benign Prostatic Hypertrophy    87mo w/PVR    HPI: Patient is an 86 year old male with a history of hematuria, BPH with LU TS, bilateral renal cysts and bladder diverticulum who presents today for a one year follow up.  History of hematuria (high risk) Smoker.  He completed a hematuria workup in January 2018 with CTU and cystoscopy.  Findings were positive for a large median lobe of the prostate, benign renal cysts and a small bladder diverticulum.  He does not report gross hematuria.  UA 10/04/2019 negative.      BPH WITH LUTS  (prostate and/or bladder) I PSS: 5/1     PVR:  30 mL      Previous IPSS score: 3/3    PVR: 91 mL   Major complaint(s): Post-void dribbling.  Denies any dysuria, hematuria or suprapubic pain.   Currently taking: Finasteride 5 mg daily and tamsulosin 0.4 mg daily   His has had TARGIS in 2008.  He is not interested in having any more prostate procedures.     Denies any recent fevers, chills, nausea or vomiting.  IPSS    Row Name 12/25/19 1000         International Prostate Symptom Score   How often have you had the sensation of not emptying your bladder?  Less than 1 in 5     How often have you had to urinate less than every two hours?  Not at All     How often have you found you stopped and started again several times when you urinated?  Not at All     How often have you found it difficult to postpone urination?  Less than half the time     How often have you had a weak urinary stream?  Not at All     How often have you had to strain to start urination?  Not at All     How many times did you typically get up at night to urinate?  2 Times     Total IPSS Score  5       Quality of Life due to urinary symptoms   If you were to spend the rest of  your life with your urinary condition just the way it is now how would you feel about that?  Pleased        Score:  1-7 Mild 8-19 Moderate 20-35 Severe  Renal cysts Benign.  Cr  1.9  Bladder diverticulum Small.  Asymptomatic.    PMH: Past Medical History:  Diagnosis Date  . COPD (chronic obstructive pulmonary disease) ([place])    pt denies.  . Hypertension   . Swelling of lower extremity    bilateral  . Wears dentures    full upper and lower - do not fit well.    Surgical History: Past Surgical History:  Procedure Laterality Date  . CATARACT EXTRACTION W/PHACO Right 09/30/2015   Procedure: CATARACT EXTRACTION PHACO AND INTRAOCULAR LENS PLACEMENT ([place]);  Surgeon: Leandrew Koyanagi, MD;  Location: [place];  Service: Ophthalmology;  Laterality: Right;  . COLONOSCOPY  2009?  Marland Kitchen HERNIA REPAIR Right    x2     Home Medications:  Allergies as of 12/25/2019   No Known Allergies     Medication  List       Accurate as of December 25, 2019 11:09 AM. If you have any questions, ask your nurse or doctor.        acetaminophen 500 MG tablet Commonly known as: TYLENOL Take 500 mg by mouth every 4 (four) hours as needed.   amLODipine 10 MG tablet Commonly known as: NORVASC Take 10 mg by mouth daily. PM   aspirin 81 MG tablet Take 81 mg by mouth daily. PM   clotrimazole 1 % cream Commonly known as: LOTRIMIN Apply topically.   cyclobenzaprine 10 MG tablet Commonly known as: FLEXERIL Take 10 mg by mouth daily as needed for muscle spasms.   finasteride 5 MG tablet Commonly known as: PROSCAR Take 1 tablet (5 mg total) by mouth daily.   hydrochlorothiazide 25 MG tablet Commonly known as: HYDRODIURIL Take 25 mg by mouth daily. PM   metoprolol succinate 25 MG 24 hr tablet Commonly known as: TOPROL-XL Take 25 mg by mouth 2 (two) times daily.   nortriptyline 10 MG capsule Commonly known as: PAMELOR Take 10 mg by mouth at bedtime.   polyethylene glycol  powder 17 GM/SCOOP powder Commonly known as: GLYCOLAX/MIRALAX TAKE 17G ONCE DAILY AS NEEDED FOR CONSTIPATION (ADJUST AMOUNT AS NEEDED) MIX IN 4-8 OZ OF FLUID   Systane Balance 0.6 % Soln Generic drug: Propylene Glycol INSTILL 3 5 TIMES A DAY IN EACH EYE AS NEEDED FOR DRYNESS.   tamsulosin 0.4 MG Caps capsule Commonly known as: FLOMAX Take 1 capsule (0.4 mg total) by mouth daily.   vitamin B-12 500 MCG tablet Commonly known as: CYANOCOBALAMIN Take 500 mcg by mouth daily. PM       Allergies: No Known Allergies  Family History: Family History  Problem Relation Age of Onset  . Prostate cancer Brother   . Alzheimer's disease Mother   . Stroke Father   . Kidney cancer Neg Hx   . Kidney disease Neg Hx   . Bladder Cancer Neg Hx     Social History:  reports that he has been smoking cigarettes. He has a 22.50 pack-year smoking history. He has never used smokeless tobacco. He reports that he does not drink alcohol or use drugs.  ROS: UROLOGY Frequent Urination?: No Hard to postpone urination?: Yes Burning/pain with urination?: No Get up at night to urinate?: No Leakage of urine?: No Urine stream starts and stops?: No Trouble starting stream?: No Do you have to strain to urinate?: No Blood in urine?: No Urinary tract infection?: No Sexually transmitted disease?: No Injury to kidneys or bladder?: No Painful intercourse?: No Weak stream?: No Erection problems?: No Penile pain?: No  Gastrointestinal Nausea?: No Vomiting?: No Indigestion/heartburn?: No Diarrhea?: No Constipation?: Yes  Constitutional Fever: No Night sweats?: No Weight loss?: No Fatigue?: No  Skin Skin rash/lesions?: No Itching?: No  Eyes Blurred vision?: No Double vision?: Yes  Ears/Nose/Throat Sore throat?: No Sinus problems?: No  Hematologic/Lymphatic Swollen glands?: No Easy bruising?: No  Cardiovascular Leg swelling?: No Chest pain?: No  Respiratory Cough?: No Shortness of  breath?: No  Endocrine Excessive thirst?: No  Musculoskeletal Back pain?: No Joint pain?: No  Neurological Headaches?: No Dizziness?: No  Psychologic Depression?: No Anxiety?: No  Physical Exam: BP (!) 145/95   Pulse 80   Ht 5\' 10"  (1.778 m)   Wt 130 lb (59 kg)   BMI 18.65 kg/m   Constitutional:  Well nourished. Alert and oriented, No acute distress. HEENT: [place] AT, moist mucus membranes.  Trachea midline, no  masses. Cardiovascular: No clubbing, cyanosis, or edema. Respiratory: Normal respiratory effort, no increased work of breathing. Neurologic: Grossly intact, no focal deficits, moving all 4 extremities. Psychiatric: Normal mood and affect.   Laboratory Data: Lab Results  Component Value Date   WBC 5.9 11/17/2012   HGB 15.9 11/17/2012   HCT 48.8 11/17/2012   MCV 89 11/17/2012   PLT 305 11/17/2012    Lab Results  Component Value Date   CREATININE 1.21 12/02/2016    Lab Results  Component Value Date   TSH 0.653 11/18/2012       Component Value Date/Time   CHOL 126 11/18/2012 0451   HDL 59 11/18/2012 0451   VLDL 16 11/18/2012 0451   LDLCALC 51 11/18/2012 0451    Lab Results  Component Value Date   AST 25 11/17/2012   Lab Results  Component Value Date   ALT 17 11/17/2012    Urinalysis Component     Latest Ref Rng & Units 10/04/2019  Specific Gravity, UA     1.005 - 1.030 1.025  pH, UA     5.0 - 7.5 6.0  Color, UA     Yellow Yellow  Appearance Ur     Clear Clear  Leukocytes,UA     Negative Negative  Protein,UA     Negative/Trace Negative  Glucose, UA     Negative Negative  Ketones, UA     Negative Negative  RBC, UA     Negative Negative  Bilirubin, UA     Negative Negative  Urobilinogen, Ur     0.2 - 1.0 mg/dL 2.0 (H)  Nitrite, UA     Negative Negative  Microscopic Examination      See below:   Component     Latest Ref Rng & Units 10/04/2019  WBC, UA     0 - 5 /hpf 0-5  RBC     0 - 2 /hpf None seen  Epithelial Cells  (non renal)     0 - 10 /hpf 0-10  Bacteria, UA     None seen/Few None seen   I have reviewed the labs.  Pertinent Imaging Results for Jacob [name], Jacob [name] (MRN [id number]) as of 12/25/2019 10:56  Ref. Range 12/25/2019 10:44  Scan Result Unknown 51ml    Assessment & Plan:    1. History of hematuria - hematuria work up completed in 12/2016 - no worrisome findings - no reports of gross hematuria - RTC in 09/2020 for UA   2. BPH with LUTS - IPSS score is 5/1, it improved - Continue conservative management, avoiding bladder irritants and timed voiding's - most bothersome symptoms is/are post void dribbling - Continue tamsulosin 0.4 mg and finasteride 5 mg daily; refills given - RTC in 09/2020 for I PSS and PVR   3. Bilateral renal cysts  - benign  4. Bladder diverticulum  - not bothersome  Return for 09/2020 UA, I PSS and PVR .  These notes generated with voice recognition software. I apologize for typographical errors.  Zara Council, PA-C  [facility] [address] [place], [place] [address] [phone]

## 2019-12-25 ENCOUNTER — Other Ambulatory Visit: Payer: Self-pay | Admitting: Family Medicine

## 2019-12-25 ENCOUNTER — Other Ambulatory Visit: Payer: Self-pay

## 2019-12-25 ENCOUNTER — Ambulatory Visit: Payer: Medicare Other | Admitting: Urology

## 2019-12-25 ENCOUNTER — Encounter: Payer: Self-pay | Admitting: Urology

## 2019-12-25 VITALS — BP 145/95 | HR 80 | Ht 70.0 in | Wt 130.0 lb

## 2019-12-25 DIAGNOSIS — N401 Enlarged prostate with lower urinary tract symptoms: Secondary | ICD-10-CM

## 2019-12-25 DIAGNOSIS — N138 Other obstructive and reflux uropathy: Secondary | ICD-10-CM

## 2019-12-25 LAB — BLADDER SCAN AMB NON-IMAGING

## 2020-05-08 ENCOUNTER — Other Ambulatory Visit: Payer: Self-pay | Admitting: Urology

## 2020-05-08 DIAGNOSIS — N138 Other obstructive and reflux uropathy: Secondary | ICD-10-CM

## 2020-09-24 NOTE — Progress Notes (Signed)
09/25/2020 7:12 PM   Jacob [name] 02-27-1934 [id number]  Referring provider: [name], MD [facility] [place],  [place] [address]  Chief Complaint  Patient presents with  . Benign Prostatic Hypertrophy    HPI: Patient is an 86 year old male with a history of hematuria, BPH with LU TS, bilateral renal cysts and bladder diverticulum who presents today for a one year follow up.  History of hematuria (high risk) Smoker.  He completed a hematuria workup in January 2018 with CTU and cystoscopy.  Findings were positive for a large median lobe of the prostate, benign renal cysts and a small bladder diverticulum.  He does not report gross hematuria.  UA 09/25/2020 negative for micro heme   BPH WITH LUTS  (prostate and/or bladder) I PSS: 6/3     Previous IPSS score: 5/1    PVR: 30 mL   Major complaint(s): Post-void dribbling.  Denies any dysuria, hematuria or suprapubic pain.   Currently taking: Finasteride 5 mg daily and tamsulosin 0.4 mg daily   His has had TARGIS in 2008.  He is not interested in having any more prostate procedures.     Denies any recent fevers, chills, nausea or vomiting.   IPSS    Row Name 09/25/20 1000         International Prostate Symptom Score   How often have you had the sensation of not emptying your bladder? Less than 1 in 5     How often have you had to urinate less than every two hours? Not at All     How often have you found you stopped and started again several times when you urinated? Not at All     How often have you found it difficult to postpone urination? Less than half the time     How often have you had a weak urinary stream? Not at All     How often have you had to strain to start urination? Not at All     How many times did you typically get up at night to urinate? 3 Times     Total IPSS Score 6       Quality of Life due to urinary symptoms   If you were to spend the rest of your life with your urinary  condition just the way it is now how would you feel about that? Mixed            Score:  1-7 Mild 8-19 Moderate 20-35 Severe  Renal cysts Benign.  Cr  1.2  Bladder diverticulum Small.  Asymptomatic.     Patient states he has had issues of stool incontinence.  He states he has to put a tissue in his rectum to keep his underwear clean.  This is been going on for 1 week.  He denied any issues with constipation.  He denies any blood in the stool or pain with bowel movements or dark-colored stool.   KUB shows constipation.   PMH: Past Medical History:  Diagnosis Date  . COPD (chronic obstructive pulmonary disease) ([place])    pt denies.  . Hypertension   . Swelling of lower extremity    bilateral  . Wears dentures    full upper and lower - do not fit well.    Surgical History: Past Surgical History:  Procedure Laterality Date  . CATARACT EXTRACTION W/PHACO Right 09/30/2015   Procedure: CATARACT EXTRACTION PHACO AND INTRAOCULAR LENS PLACEMENT ([place]);  Surgeon: Nila Nephew  Brasington, MD;  Location: [place];  Service: Ophthalmology;  Laterality: Right;  . COLONOSCOPY  2009?  Marland Kitchen HERNIA REPAIR Right    x2     Home Medications:  Allergies as of 09/25/2020   No Known Allergies     Medication List       Accurate as of September 25, 2020 11:59 PM. If you have any questions, ask your nurse or doctor.        acetaminophen 500 MG tablet Commonly known as: TYLENOL Take 500 mg by mouth every 4 (four) hours as needed.   amitriptyline 10 MG tablet Commonly known as: ELAVIL Take by mouth.   amLODipine 10 MG tablet Commonly known as: NORVASC Take 10 mg by mouth daily. PM   aspirin 81 MG tablet Take 81 mg by mouth daily. PM   cetirizine 10 MG tablet Commonly known as: ZYRTEC Take by mouth.   cyclobenzaprine 5 MG tablet Commonly known as: FLEXERIL Take by mouth. What changed: Another medication with the same name was removed. Continue taking this medication, and  follow the directions you see here. Changed by: Zara Council, PA-C   finasteride 5 MG tablet Commonly known as: PROSCAR TAKE 1 TABLET BY MOUTH EVERY DAY   hydrochlorothiazide 25 MG tablet Commonly known as: HYDRODIURIL Take 25 mg by mouth daily. PM   metoprolol succinate 50 MG 24 hr tablet Commonly known as: TOPROL-XL Take 50 mg by mouth daily. What changed: Another medication with the same name was removed. Continue taking this medication, and follow the directions you see here. Changed by: Zara Council, PA-C   nortriptyline 10 MG capsule Commonly known as: PAMELOR Take 10 mg by mouth at bedtime.   polyethylene glycol powder 17 GM/SCOOP powder Commonly known as: GLYCOLAX/MIRALAX TAKE 17G ONCE DAILY AS NEEDED FOR CONSTIPATION (ADJUST AMOUNT AS NEEDED) MIX IN 4-8 OZ OF FLUID   Systane Balance 0.6 % Soln Generic drug: Propylene Glycol INSTILL 3 5 TIMES A DAY IN EACH EYE AS NEEDED FOR DRYNESS.   tamsulosin 0.4 MG Caps capsule Commonly known as: FLOMAX Take 1 capsule (0.4 mg total) by mouth daily.   vitamin B-12 500 MCG tablet Commonly known as: CYANOCOBALAMIN Take 500 mcg by mouth daily. PM       Allergies: No Known Allergies  Family History: Family History  Problem Relation Age of Onset  . Prostate cancer Brother   . Alzheimer's disease Mother   . Stroke Father   . Kidney cancer Neg Hx   . Kidney disease Neg Hx   . Bladder Cancer Neg Hx     Social History:  reports that he has been smoking cigarettes. He has a 22.50 pack-year smoking history. He has never used smokeless tobacco. He reports that he does not drink alcohol and does not use drugs.  ROS: For pertinent review of systems please refer to history of present illness  Physical Exam: BP 130/80   Pulse 70   Ht 6' (1.829 m)   Wt 130 lb (59 kg)   BMI 17.63 kg/m   Constitutional:  Well nourished. Alert and oriented, No acute distress. HEENT: [place] AT, mask in place.  Trachea midline Cardiovascular:  No clubbing, cyanosis, or edema. Respiratory: Normal respiratory effort, no increased work of breathing. Neurologic: Grossly intact, no focal deficits, moving all 4 extremities. Psychiatric: Normal mood and affect.   Laboratory Data: Specimen:  Blood  Ref Range & Units 7 mo ago  Glucose 70 - 110 mg/dL 93  Sodium 136 - 145 mmol/L 138      Potassium 3.6 - 5.1 mmol/L 4.6      Chloride 97 - 109 mmol/L 98      Carbon Dioxide (CO2) 22.0 - 32.0 mmol/L 32.6High      Urea Nitrogen (BUN) 7 - 25 mg/dL 16      Creatinine 0.7 - 1.3 mg/dL 1.2      Glomerular Filtration Rate (eGFR), MDRD Estimate >60 mL/min/1.73sq m 70      Calcium 8.7 - 10.3 mg/dL 10.0      Albumin 3.5 - 4.8 g/dL 4.2      Phosphorus 2.5 - 5.0 mg/dL 4.0      Resulting Agency  [facility]  Specimen Collected: 01/31/20 11:25 AM Last Resulted: 01/31/20 4:42 PM  Received From: [facility]  Result Received: 03/14/20 7:10 PM   Specimen:  Urine  Ref Range & Units 7 mo ago  Color Yellow  Yellow      Clarity Clear  Clear      Specific Gravity 1.000 - 1.030  1.020      pH, Urine 5.0 - 8.0  6.0      Protein, Urinalysis Negative, Trace mg/dL Negative      Glucose, Urinalysis Negative mg/dL Negative      Ketones, Urinalysis Negative mg/dL Negative      Blood, Urinalysis Negative  Negative      Nitrite, Urinalysis Negative  Negative      Leukocyte Esterase, Urinalysis Negative  Negative      White Blood Cells, Urinalysis None Seen, 0-3 /hpf None Seen      Red Blood Cells, Urinalysis None Seen, 0-3 /hpf None Seen      Bacteria, Urinalysis None Seen /hpf None Seen      Squamous Epithelial Cells, Urinalysis Rare, Few, None Seen /hpf None Seen      Resulting Agency  [facility] - [facility]  Specimen Collected: 01/31/20 11:25 AM Last Resulted: 01/31/20 3:27 PM  Received From: [facility]  Result Received: 03/14/20 7:10 PM    Urinalysis Component      Latest Ref Rng & Units 09/25/2020  Specific Gravity, UA     1.005 - 1.030 >1.030 (H)  pH, UA     5.0 - 7.5 5.5  Color, UA     Yellow Yellow  Appearance Ur     Clear Clear  Leukocytes,UA     Negative Negative  Protein,UA     Negative/Trace Negative  Glucose, UA     Negative Negative  Ketones, UA     Negative Negative  RBC, UA     Negative Negative  Bilirubin, UA     Negative Negative  Urobilinogen, Ur     0.2 - 1.0 mg/dL 1.0  Nitrite, UA     Negative Negative  Microscopic Examination      See below:   Component     Latest Ref Rng & Units 09/25/2020  WBC, UA     0 - 5 /hpf 0-5  RBC     4.22 - 5.81 MIL/uL None seen  Epithelial Cells (non renal)     0 - 10 /hpf 0-10  Bacteria, UA     None seen/Few None seen   I have reviewed the labs.  Pertinent Imaging Narrative & Impression  CLINICAL DATA:  Constipation  EXAM: ABDOMEN - 1 VIEW  COMPARISON:  CT abdomen and pelvis 12/21/2016  FINDINGS: Scattered gas and stool throughout  the colon with moderately prominent stool burden. No small or large bowel distention. Calcified phleboliths in the pelvis. Vascular calcifications. No radiopaque stones. Degenerative changes in the spine.  IMPRESSION: Nonobstructive bowel gas pattern with moderately prominent stool burden.   Electronically Signed   By: Lucienne Capers M.D.   On: 10/01/2020 06:59   I have independently reviewed the films.  See HPI.     Assessment & Plan:    1. High risk hematuria - hematuria work up completed in 12/2016 - no worrisome findings - no reports of gross hematuria - UA 10/2021negative for micro heme - RTC in one year for UA   2. BPH with LUTS - IPSS score is 6/3, it worse - Continue conservative management, avoiding bladder irritants and timed voiding's - most bothersome symptoms is/are post void dribbling - Continue tamsulosin 0.4 mg and finasteride 5 mg daily; refills given - RTC in 09/2021 for I PSS and PVR   3.  Bilateral renal cysts  - benign  4. Bladder diverticulum  - not bothersome  5. Stool incontinence KUB to demonstrates constipation Advised patient to take over-the-counter stool softeners and contact his PCP for further evaluation    Return in about 1 year (around 09/25/2021) for UA, I PSS, exam and PVR.  These notes generated with voice recognition software. I apologize for typographical errors.  Zara Council, PA-C  [facility] [address] [place], [place] [address] [phone]

## 2020-09-25 ENCOUNTER — Encounter: Payer: Self-pay | Admitting: Urology

## 2020-09-25 ENCOUNTER — Ambulatory Visit (INDEPENDENT_AMBULATORY_CARE_PROVIDER_SITE_OTHER): Payer: Medicare Other | Admitting: Urology

## 2020-09-25 ENCOUNTER — Other Ambulatory Visit: Payer: Self-pay

## 2020-09-25 VITALS — BP 130/80 | HR 70 | Ht 72.0 in | Wt 130.0 lb

## 2020-09-25 DIAGNOSIS — K5909 Other constipation: Secondary | ICD-10-CM

## 2020-09-25 DIAGNOSIS — R319 Hematuria, unspecified: Secondary | ICD-10-CM | POA: Diagnosis not present

## 2020-09-25 DIAGNOSIS — N401 Enlarged prostate with lower urinary tract symptoms: Secondary | ICD-10-CM | POA: Diagnosis not present

## 2020-09-25 DIAGNOSIS — N323 Diverticulum of bladder: Secondary | ICD-10-CM

## 2020-09-25 DIAGNOSIS — N138 Other obstructive and reflux uropathy: Secondary | ICD-10-CM

## 2020-09-25 DIAGNOSIS — N281 Cyst of kidney, acquired: Secondary | ICD-10-CM | POA: Diagnosis not present

## 2020-09-25 LAB — URINALYSIS, COMPLETE
Bilirubin, UA: NEGATIVE
Glucose, UA: NEGATIVE
Ketones, UA: NEGATIVE
Leukocytes,UA: NEGATIVE
Nitrite, UA: NEGATIVE
Protein,UA: NEGATIVE
RBC, UA: NEGATIVE
Specific Gravity, UA: >1.03 — ABNORMAL HIGH (ref 1.005–1.030)
Urobilinogen, Ur: 1 mg/dL (ref 0.2–1.0)
pH, UA: 5.5 (ref 5.0–7.5)

## 2020-09-25 LAB — MICROSCOPIC EXAMINATION
Bacteria, UA: NONE SEEN
RBC: NONE SEEN /hpf (ref 0–2)

## 2020-09-25 MED ORDER — TAMSULOSIN HCL 0.4 MG PO CAPS: 0.4000 mg | ORAL_CAPSULE | Freq: Every day | ORAL | 3 refills | Status: DC

## 2020-09-30 ENCOUNTER — Ambulatory Visit
Admission: RE | Admit: 2020-09-30 | Discharge: 2020-09-30 | Disposition: A | Discharge status: Home / Self Care | Payer: Medicare Other | Source: Ambulatory Visit | Attending: Urology | Admitting: Urology

## 2020-09-30 ENCOUNTER — Ambulatory Visit
Admission: RE | Admit: 2020-09-30 | Discharge: 2020-09-30 | Disposition: A | Discharge status: Home / Self Care | Payer: Medicare Other | Attending: Urology | Admitting: Urology

## 2020-09-30 ENCOUNTER — Other Ambulatory Visit: Payer: Self-pay | Admitting: Urology

## 2020-09-30 DIAGNOSIS — K5909 Other constipation: Secondary | ICD-10-CM | POA: Insufficient documentation

## 2020-09-30 DIAGNOSIS — N401 Enlarged prostate with lower urinary tract symptoms: Secondary | ICD-10-CM

## 2020-10-01 ENCOUNTER — Telehealth: Payer: Self-pay | Admitting: Family Medicine

## 2020-10-01 NOTE — Telephone Encounter (Signed)
-----   Message from Nori Riis, PA-C sent at 10/01/2020  8:23 AM EDT ----- Please let Mr. Dunshee know that he does have a significant amount of stool throughout his colon.  I would recommend that he start an over-the-counter stool softener, laxative or MiraLAX and follow-up with his primary care physician.

## 2020-10-01 NOTE — Telephone Encounter (Signed)
LMOM for patient to return call.

## 2020-10-07 NOTE — Telephone Encounter (Signed)
Patient notified and will get the pharmacist to show him what he needs to get.

## 2020-10-08 ENCOUNTER — Emergency Department: Payer: Medicare Other

## 2020-10-08 ENCOUNTER — Encounter: Payer: Self-pay | Admitting: Emergency Medicine

## 2020-10-08 ENCOUNTER — Emergency Department
Admission: EM | Admit: 2020-10-08 | Discharge: 2020-10-08 | Disposition: A | Discharge status: Home / Self Care | Payer: Medicare Other | Attending: Emergency Medicine | Admitting: Emergency Medicine

## 2020-10-08 ENCOUNTER — Other Ambulatory Visit: Payer: Self-pay

## 2020-10-08 DIAGNOSIS — Z20822 Contact with and (suspected) exposure to covid-19: Secondary | ICD-10-CM | POA: Diagnosis not present

## 2020-10-08 DIAGNOSIS — K922 Gastrointestinal hemorrhage, unspecified: Secondary | ICD-10-CM | POA: Diagnosis not present

## 2020-10-08 DIAGNOSIS — J449 Chronic obstructive pulmonary disease, unspecified: Secondary | ICD-10-CM | POA: Diagnosis not present

## 2020-10-08 DIAGNOSIS — Z79899 Other long term (current) drug therapy: Secondary | ICD-10-CM | POA: Insufficient documentation

## 2020-10-08 DIAGNOSIS — F17210 Nicotine dependence, cigarettes, uncomplicated: Secondary | ICD-10-CM | POA: Insufficient documentation

## 2020-10-08 DIAGNOSIS — K6289 Other specified diseases of anus and rectum: Secondary | ICD-10-CM | POA: Diagnosis not present

## 2020-10-08 DIAGNOSIS — Z7982 Long term (current) use of aspirin: Secondary | ICD-10-CM | POA: Diagnosis not present

## 2020-10-08 DIAGNOSIS — N182 Chronic kidney disease, stage 2 (mild): Secondary | ICD-10-CM | POA: Diagnosis not present

## 2020-10-08 DIAGNOSIS — I129 Hypertensive chronic kidney disease with stage 1 through stage 4 chronic kidney disease, or unspecified chronic kidney disease: Secondary | ICD-10-CM | POA: Diagnosis not present

## 2020-10-08 DIAGNOSIS — K625 Hemorrhage of anus and rectum: Secondary | ICD-10-CM | POA: Diagnosis present

## 2020-10-08 LAB — CBC
HCT: 53 % — ABNORMAL HIGH (ref 39.0–52.0)
Hemoglobin: 17.7 g/dL — ABNORMAL HIGH (ref 13.0–17.0)
MCH: 28.5 pg (ref 26.0–34.0)
MCHC: 33.4 g/dL (ref 30.0–36.0)
MCV: 85.5 fL (ref 80.0–100.0)
Platelets: 502 10*3/uL — ABNORMAL HIGH (ref 150–400)
RBC: 6.2 MIL/uL — ABNORMAL HIGH (ref 4.22–5.81)
RDW: 14.2 % (ref 11.5–15.5)
WBC: 6.9 10*3/uL (ref 4.0–10.5)
nRBC: 0 % (ref 0.0–0.2)

## 2020-10-08 LAB — COMPREHENSIVE METABOLIC PANEL
ALT: 13 U/L (ref 0–44)
AST: 29 U/L (ref 15–41)
Albumin: 4.1 g/dL (ref 3.5–5.0)
Alkaline Phosphatase: 55 U/L (ref 38–126)
Anion gap: 13 (ref 5–15)
BUN: 19 mg/dL (ref 8–23)
CO2: 29 mmol/L (ref 22–32)
Calcium: 9.6 mg/dL (ref 8.9–10.3)
Chloride: 95 mmol/L — ABNORMAL LOW (ref 98–111)
Creatinine, Ser: 1.27 mg/dL — ABNORMAL HIGH (ref 0.61–1.24)
GFR, Estimated: 56 mL/min — ABNORMAL LOW (ref >60)
Glucose, Bld: 124 mg/dL — ABNORMAL HIGH (ref 70–99)
Potassium: 3.6 mmol/L (ref 3.5–5.1)
Sodium: 137 mmol/L (ref 135–145)
Total Bilirubin: 1.4 mg/dL — ABNORMAL HIGH (ref 0.3–1.2)
Total Protein: 7.8 g/dL (ref 6.5–8.1)

## 2020-10-08 LAB — TYPE AND SCREEN
ABO/RH(D): O POS
Antibody Screen: NEGATIVE

## 2020-10-08 LAB — RESPIRATORY PANEL BY RT PCR (FLU A&B, COVID)
Influenza A by PCR: NEGATIVE
Influenza B by PCR: NEGATIVE
SARS Coronavirus 2 by RT PCR: NEGATIVE

## 2020-10-08 MED ORDER — METRONIDAZOLE 500 MG PO TABS: 500.0000 mg | ORAL_TABLET | Freq: Three times a day (TID) | ORAL | 0 refills | Status: DC

## 2020-10-08 MED ORDER — PANTOPRAZOLE SODIUM 40 MG IV SOLR
40.0000 mg | Freq: Once | INTRAVENOUS | Status: AC
  Administered 2020-10-08: 40 mg via INTRAVENOUS
  Filled 2020-10-08: qty 40

## 2020-10-08 MED ORDER — CIPROFLOXACIN HCL 500 MG PO TABS: 500.0000 mg | ORAL_TABLET | Freq: Two times a day (BID) | ORAL | 0 refills | Status: DC

## 2020-10-08 MED ORDER — DICYCLOMINE HCL 10 MG PO CAPS
10.0000 mg | ORAL_CAPSULE | Freq: Once | ORAL | Status: AC
  Administered 2020-10-08: 10 mg via ORAL
  Filled 2020-10-08: qty 1

## 2020-10-08 MED ORDER — IOHEXOL 350 MG/ML SOLN
75.0000 mL | Freq: Once | INTRAVENOUS | Status: AC | PRN
  Administered 2020-10-08: 75 mL via INTRAVENOUS

## 2020-10-08 NOTE — ED Notes (Signed)
Pt back to the bathroom.

## 2020-10-08 NOTE — ED Triage Notes (Signed)
Pt come into the ED via [facility] from home c/o rectal bleeding with loose stool.  Pt states that initially the stool was black in color and now it is starting to lighten up.  Pt denies any blood thinner use.  Pt admits to abdominal pain associated with it. Pt in NAD at this time but has been having to go to the bathroom many times during prior to triage.  Pt has even and unlabored respiration at this time.  Pt states he has had some SHOB for over 6 months.  Pt denies any nausea, vomiting, or dizziness.  Pt presents tachycardic at this time.

## 2020-10-08 NOTE — ED Notes (Signed)
Pt called for triage, pt found in bathroom. States he will be out shortly. Denies further needs.

## 2020-10-08 NOTE — ED Notes (Signed)
Follow up gastro info provided all questions answered

## 2020-10-08 NOTE — Discharge Instructions (Signed)
Please follow up with Dr. Marius Ditch from gastroenterology for further evaluation of your rectal bleeding.  Take cipro and flagyl in the meantime to decrease inflammation

## 2020-10-08 NOTE — Progress Notes (Signed)
Called by ER physician to admit this patient for evaluation of multiple episodes of rectal bleeding which he describes as frequent, small-volume grossly bloody bowel movements. Patient noted to have normal vital signs with blood pressure 140/112 and heart rate of 78 Hemoglobin is 17.7g/dl and is slightly higher than his baseline Patient had a CT scan of abdomen and pelvis which shows diffuse abnormal thickening and hyperemia of the distal rectum and anal region.  This is concerning for potential underlying tumor.  Hyperemia may also suggest infection/proctitis.  No perirectal abscess is identified.  Recommend sigmoidoscopy/colonoscopy ER physician discussed findings with gastroenterology who recommended outpatient follow-up.  Patient discharged from the emergency room

## 2020-10-08 NOTE — ED Notes (Signed)
Pt had a irregular cardiac event vtack vs artifact ed md made aware

## 2020-10-08 NOTE — ED Notes (Addendum)
BIB [facility] from home for blood in stool 1-3 weeks. VSS. Pt alert and oriented X4, cooperative, RR even and unlabored, color WNL. Pt in NAD.

## 2020-10-08 NOTE — ED Provider Notes (Addendum)
[facility] Emergency Department Provider Note  ____________________________________________  Time seen: Approximately 2:39 PM  I have reviewed the triage vital signs and the nursing notes.   HISTORY  Chief Complaint Rectal Bleeding    HPI Jacob [name] is a 86 y.o. male with a history of COPD, HTN, who comes to the ED c/o rectal bleeding. He reports that he started having black stool about a week ago.  It has continued, and in the last 24 hours he has had frequent small-volume grossly bloodly bowel movements, about 15 times yesterday.  Denies lightheadedness or syncope, but does state he feels more fatigued lately. Last colonoscopy greater than 10 years ago.  No blood thinner use, only baby aspirin.   Sees urology for 'enlarged prostate.'      Past Medical History:  Diagnosis Date  . COPD (chronic obstructive pulmonary disease) ([place])    pt denies.  . Hypertension   . Swelling of lower extremity    bilateral  . Wears dentures    full upper and lower - do not fit well.     Patient Active Problem List   Diagnosis Date Noted  . Vitiligo 10/04/2019  . Vitamin D deficiency 12/28/2018  . Right inguinal hernia 06/21/2018  . Chronic kidney insufficiency, stage 2 (mild) 02/02/2018  . Chronic bronchitis, simple ([place]) 12/28/2016  . High risk medication use 12/24/2015  . Cigarette smoker 12/24/2015  . Chronic constipation 06/25/2015  . Benign essential hypertension 06/24/2014  . Benign prostatic hyperplasia 06/24/2014  . Back pain 06/24/2014  . B12 deficiency 06/24/2014     Past Surgical History:  Procedure Laterality Date  . CATARACT EXTRACTION W/PHACO Right 09/30/2015   Procedure: CATARACT EXTRACTION PHACO AND INTRAOCULAR LENS PLACEMENT ([place]);  Surgeon: Leandrew Koyanagi, MD;  Location: [facility];  Service: Ophthalmology;  Laterality: Right;  . COLONOSCOPY  2009?  Marland Kitchen HERNIA REPAIR Right    x2      Prior to Admission medications    Medication Sig Start Date End Date Taking? Authorizing Provider  acetaminophen (TYLENOL) 500 MG tablet Take 500 mg by mouth every 4 (four) hours as needed.     [provider]  amitriptyline (ELAVIL) 10 MG tablet Take by mouth. 09/11/20   [provider]  amLODipine (NORVASC) 10 MG tablet Take 10 mg by mouth daily. PM    [provider]  aspirin 81 MG tablet Take 81 mg by mouth daily. PM    [provider]  cetirizine (ZYRTEC) 10 MG tablet Take by mouth.    [provider]  ciprofloxacin (CIPRO) 500 MG tablet Take 1 tablet (500 mg total) by mouth 2 (two) times daily. 10/08/20   Carrie Mew, MD  cyanocobalamin 500 MCG tablet Take 500 mcg by mouth daily. PM    [provider]  cyclobenzaprine (FLEXERIL) 5 MG tablet Take by mouth. 07/08/20   [provider]  finasteride (PROSCAR) 5 MG tablet TAKE 1 TABLET BY MOUTH EVERY DAY 05/08/20   Vaillancourt, Aldona Bar, PA-C  hydrochlorothiazide (HYDRODIURIL) 25 MG tablet Take 25 mg by mouth daily. PM    [provider]  metoprolol succinate (TOPROL-XL) 50 MG 24 hr tablet Take 50 mg by mouth daily. 08/03/20   [provider]  metroNIDAZOLE (FLAGYL) 500 MG tablet Take 1 tablet (500 mg total) by mouth 3 (three) times daily. 10/08/20   Carrie Mew, MD  nortriptyline (PAMELOR) 10 MG capsule Take 10 mg by mouth at bedtime. 12/18/19   [provider]  polyethylene glycol powder (GLYCOLAX/MIRALAX) powder TAKE 17G ONCE DAILY AS NEEDED FOR CONSTIPATION (ADJUST AMOUNT AS NEEDED) MIX IN 4-8 OZ OF FLUID 08/08/16   [provider]  Propylene Glycol (SYSTANE BALANCE) 0.6 % SOLN INSTILL 3 5 TIMES A DAY IN EACH EYE AS NEEDED FOR DRYNESS. 06/03/19   [provider]  tamsulosin (FLOMAX) 0.4 MG CAPS capsule TAKE 1 CAPSULE BY MOUTH EVERY DAY 09/30/20   Zara Council A, PA-C     Allergies Patient has no known allergies.   Family History  Problem Relation Age of  Onset  . Prostate cancer Brother   . Alzheimer's disease Mother   . Stroke Father   . Kidney cancer Neg Hx   . Kidney disease Neg Hx   . Bladder Cancer Neg Hx     Social History Social History   Tobacco Use  . Smoking status: Current Every Day Smoker    Packs/day: 0.50    Years: 45.00    Pack years: 22.50    Types: Cigarettes  . Smokeless tobacco: Never Used  Substance Use Topics  . Alcohol use: No  . Drug use: No    Review of Systems  Constitutional:   No fever or chills.  ENT:   No sore throat. No rhinorrhea. Cardiovascular:   No chest pain or syncope. Respiratory:   No dyspnea or cough. Gastrointestinal:   Positive for generazlied abdominal pain and bloody bowel movements. Denies vomiting.  Musculoskeletal:   Negative for focal pain or swelling All other systems reviewed and are negative except as documented above in ROS and HPI.  ____________________________________________   PHYSICAL EXAM:  VITAL SIGNS: ED Triage Vitals  Enc Vitals Group     BP 10/08/20 1042 (!) 140/112     Pulse Rate 10/08/20 1042 78     Resp 10/08/20 1042 20     Temp 10/08/20 1042 97.8 F (36.6 C)     Temp Source 10/08/20 1042 Oral     SpO2 10/08/20 1042 98 %     Weight 10/08/20 1043 130 lb (59 kg)     Height 10/08/20 1043 6' (1.829 m)     Head Circumference --      Peak Flow --      Pain Score 10/08/20 1042 0     Pain Loc --      Pain Edu? --      Excl. in [place]? --     Vital signs reviewed, nursing assessments reviewed.   Constitutional:   Alert and oriented. Non-toxic appearance. Eyes:   Conjunctivae are normal. EOMI. PERRL. ENT      Head:   Normocephalic and atraumatic.      Nose:   Wearing a mask.      Mouth/Throat:   Wearing a mask.      Neck:   No meningismus. Full ROM. Hematological/Lymphatic/Immunilogical:   No cervical lymphadenopathy. Cardiovascular:   RRR. Symmetric bilateral radial and DP pulses.  No murmurs. Cap refill less than 2 seconds. Respiratory:   Normal  respiratory effort without tachypnea/retractions. Breath sounds are clear and equal bilaterally. No wheezes/rales/rhonchi. Gastrointestinal:   Soft and nontender. Non distended. There is no CVA tenderness.  No rebound, rigidity, or guarding. Rectal exam reveals melanotic stool, hemoccult positive  Musculoskeletal:   Normal range of motion in all extremities. No joint effusions.  No lower extremity tenderness.  No edema. Neurologic:   Normal speech and language.  Motor grossly intact. No acute focal neurologic deficits are appreciated.  Skin:  Skin is warm, dry and intact. No rash noted.  No petechiae, purpura, or bullae.  ____________________________________________    LABS (pertinent positives/negatives) (all labs ordered are listed, but only abnormal results are displayed) Labs Reviewed  COMPREHENSIVE METABOLIC PANEL - Abnormal; Notable for the following components:      Result Value   Chloride 95 (*)    Glucose, Bld 124 (*)    Creatinine, Ser 1.27 (*)    Total Bilirubin 1.4 (*)    GFR, Estimated 56 (*)    All other components within normal limits  CBC - Abnormal; Notable for the following components:   RBC 6.20 (*)    Hemoglobin 17.7 (*)    HCT 53.0 (*)    Platelets 502 (*)    All other components within normal limits  RESPIRATORY PANEL BY RT PCR (FLU A&B, COVID)  POC OCCULT BLOOD, ED  TYPE AND SCREEN   ____________________________________________   EKG  Interpreted by me Sinus rhythm, rate of 74. Left axis, normal intervals. PRWP. Nl ST segments and T waves. 3 PVCs on the strip.   ____________________________________________    RADIOLOGY  DG Chest Portable 1 View  Result Date: 10/08/2020 CLINICAL DATA:  Cough, fatigue. EXAM: PORTABLE CHEST 1 VIEW COMPARISON:  None. FINDINGS: The heart size and mediastinal contours are within normal limits. Both lungs are clear. The visualized skeletal structures are unremarkable. IMPRESSION: No active disease. Electronically  Signed   By: Marijo Conception M.D.   On: 10/08/2020 13:56   CT Angio Abd/Pel w/ and/or w/o  Result Date: 10/08/2020 CLINICAL DATA:  Rectal bleeding and loose stool. EXAM: CT ANGIOGRAPHY ABDOMEN AND PELVIS WITH CONTRAST AND WITHOUT CONTRAST TECHNIQUE: Multidetector CT imaging of the abdomen and pelvis was performed using the standard protocol during bolus administration of intravenous contrast. Multiplanar reconstructed images and MIPs were obtained and reviewed to evaluate the vascular anatomy. CONTRAST:  50mL OMNIPAQUE IOHEXOL 350 MG/ML SOLN COMPARISON:  CT of the abdomen and pelvis on 12/21/2016 FINDINGS: VASCULAR Aorta: Atherosclerosis and extreme tortuosity of the abdominal aorta without aneurysmal disease. No evidence of aortic dissection. Slight irregularity in contrast opacification along the right anterior aspect of the infrarenal aorta is felt to most likely be flow related due to tortuosity. 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 The distal aspect of the celiac trunk demonstrates focal aneurysmal disease separate true is predominantly along the inferior aspect of the course of the celiac trunk and measures approximately 10 mm in greatest diameter. Distal branches are normally patent. SMA: Normally patent. Renals: Bilateral single renal arteries are normally patent. IMA: Calcified plaque at the origin of the IMA.  The IMA is patent. Inflow: Severely tortuous bilateral iliac arteries. Both common iliac arteries demonstrate fusiform dilatation with the right demonstrating maximal diameter of 1.7 cm and the left maximal diameter of 1.8 cm. No significant obstructive disease of the iliac arteries bilaterally. Proximal Outflow: Normally patent bilateral common femoral arteries and femoral bifurcations. Veins: Venous phase imaging demonstrates normally patent visualized portal vein, mesenteric veins and splenic vein. The IVC and iliac venous opacification is limited due to delayed  venous opacification, likely reflecting poor cardiac output. Review of the MIP images confirms the above findings. NON-VASCULAR Lower chest: Probable component of emphysematous lung disease at the lung bases. No pleural effusions. Hepatobiliary: The liver is unremarkable. There are several calcified gallstones in the gallbladder. No biliary ductal dilatation identified. Pancreas: Unremarkable. No pancreatic ductal dilatation or surrounding inflammatory changes. Spleen: Normal in size without  focal abnormality. Adrenals/Urinary Tract: Diffuse bilateral cystic disease of the kidneys bilaterally consistent with polycystic kidney disease. No evidence of solid renal masses or hydronephrosis. No calculi identified. The bladder is decompressed. Stomach/Bowel: On the venous phase of imaging, there is diffuse abnormal thickening and hyperemia of the distal rectum and anal region. This is concerning for potential underlying tumor. The hyperemia may also suggest infection/proctitis. No focal abscess is identified. There is no evidence of free air. No associated bowel obstruction. Lymphatic: No enlarged abdominal or pelvic lymph nodes. Reproductive: Probable mild to moderate prostate enlargement. Other: No abdominal wall hernia or abnormality. No abdominopelvic ascites. Musculoskeletal: No acute or significant osseous findings. IMPRESSION: 1. Atherosclerosis and extreme tortuosity of the abdominal aorta without aneurysmal disease. 2. 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 3. 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 4. 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 5. Diffuse abnormal thickening and hyperemia of the distal rectum and anal region. This is concerning for potential underlying tumor. The hyperemia may also suggest infection/proctitis. No perirectal  abscess is identified. Correlation with sigmoidoscopy/colonoscopy may be helpful to evaluate for potential underlying tumor. 6. Polycystic kidney disease with diffuse bilateral cystic disease of the kidneys. 7. Cholelithiasis. Electronically Signed   By: Aletta Edouard M.D.   On: 10/08/2020 14:49    ____________________________________________   PROCEDURES Procedures  ____________________________________________  DIFFERENTIAL DIAGNOSIS   UGIB / PUD, diverticular bleed, colon cancer, colitis, internal hemorrhoids  CLINICAL IMPRESSION / ASSESSMENT AND PLAN / ED COURSE  Medications ordered in the ED: Medications  pantoprazole (PROTONIX) injection 40 mg (has no administration in time range)  iohexol (OMNIPAQUE) 350 MG/ML injection 75 mL (75 mLs Intravenous Contrast Given 10/08/20 1403)    Pertinent labs & imaging results that were available during my care of the patient were reviewed by me and considered in my medical decision making (see chart for details).  DAJION BICKFORD was evaluated in Emergency Department on 10/08/2020 for the symptoms described in the history of present illness. He was evaluated in the context of the global COVID-19 pandemic, which necessitated consideration that the patient might be at risk for infection with the SARS-CoV-2 virus that causes COVID-19. Institutional protocols and algorithms that pertain to the evaluation of patients at risk for COVID-19 are in a state of rapid change based on information released by regulatory bodies including the CDC and federal and state organizations. These policies and algorithms were followed during the patient's care in the ED.   Pt p/w melanotic stool, concerning for UGIB. Initial VS and labs are unremarkable, Hb 17.   Will give IV PPI and obtain CT abdomen.   If CT is reassuring and hemoglobin stable, would plan to have him f/u with outpatient GI.   ----------------------------------------- 3:17 PM on  10/08/2020 -----------------------------------------  CT scan shows proctitis versus rectal mass.  Unlikely to bleed substantially.  Discussed with GI who agrees that patient can be followed up in office.  Will start on Cipro and Flagyl in the meantime      ____________________________________________   FINAL CLINICAL IMPRESSION(S) / ED DIAGNOSES    Final diagnoses:  Acute GI bleeding  Proctitis     ED Discharge Orders         Ordered    ciprofloxacin (CIPRO) 500 MG tablet  2 times daily        10/08/20  1515    metroNIDAZOLE (FLAGYL) 500 MG tablet  3 times daily        10/08/20 1515          Portions of this note were generated with dragon dictation software. Dictation errors may occur despite best attempts at proofreading.   Carrie Mew, MD 10/08/20 1452    Carrie Mew, MD 10/08/20 [phone]

## 2020-10-08 NOTE — ED Notes (Signed)
Pt back to wheelchair awaiting triage.

## 2020-10-08 NOTE — ED Notes (Signed)
Pt's HR was at 149 with initial vital signs, EKG ordered under protocols.  Pt denies any CP or SHOB.  Pt's HR decreased to 78 by the time EKG was captured.

## 2020-10-08 NOTE — ED Notes (Signed)
Pt in ct 

## 2020-10-09 ENCOUNTER — Telehealth: Payer: Self-pay | Admitting: General Practice

## 2020-10-09 NOTE — Telephone Encounter (Signed)
Called the patient to schedule a hospital follow up with one our providers and there was no answer VM was full, unable to leave a message. I tried contacting The spouse and son on the DPR and both numbers were incorrect numbers([phone]). A letter will be mail to address on file.

## 2020-10-09 NOTE — Telephone Encounter (Signed)
-----   Message from Lin Landsman, MD sent at 10/08/2020  3:13 PM EDT ----- Regarding: ER referral New pt for rectal bleeding with any provider  Thanks RV

## 2020-10-22 NOTE — Telephone Encounter (Signed)
Patient called to check how much copay was and states he will call back to schedule appt in Jan. FYI

## 2020-12-21 ENCOUNTER — Other Ambulatory Visit: Payer: Self-pay

## 2020-12-21 ENCOUNTER — Ambulatory Visit: Payer: Medicare Other | Admitting: Gastroenterology

## 2020-12-21 ENCOUNTER — Encounter: Payer: Self-pay | Admitting: Gastroenterology

## 2020-12-21 VITALS — BP 105/52 | HR 81 | Temp 97.4°F | Ht 72.0 in | Wt 127.4 lb

## 2020-12-21 DIAGNOSIS — K639 Disease of intestine, unspecified: Secondary | ICD-10-CM | POA: Diagnosis not present

## 2020-12-21 DIAGNOSIS — K625 Hemorrhage of anus and rectum: Secondary | ICD-10-CM

## 2020-12-21 DIAGNOSIS — R195 Other fecal abnormalities: Secondary | ICD-10-CM

## 2020-12-21 MED ORDER — NA SULFATE-K SULFATE-MG SULF 17.5-3.13-1.6 GM/177ML PO SOLN: 354.0000 mL | Freq: Once | ORAL | 0 refills | Status: AC

## 2020-12-21 NOTE — Progress Notes (Signed)
Cephas Darby, MD [address]  [place]  [place], [place] [address]  Main: [phone]  Fax: [phone]    Gastroenterology Consultation  Referring Provider:     [name], MD Primary Care Physician:  [name], MD Primary Gastroenterologist:  Dr. Cephas Darby Reason for Consultation:     Blood in stool        HPI:   Jacob [name] is a 87 y.o. male referred by Dr. [name], MD  for consultation & management of blood in stool.  Patient went to [facility] ER end of October secondary to rectal bleeding and loose stool.  Patient has been experiencing loose stools, about 2 times a day.  He is concerned about leakage of stool when he stands up.  He has been taking Pepto-Bismol and his stools are black.  He underwent CT angio which did not reveal any active bleeding source, otherwise revealed 50 to 60% celiac artery stenosis and diffuse abnormal thickening and hyperemia of the distal rectum and anal region concerning for potential underlying tumor or possible proctitis.  He was discharged home on Cipro and Flagyl with outpatient GI follow-up.  Patient denies abdominal pain.  He lost about 5 to 6 pounds.  He reports lack of appetite.  He denies any bright red blood per rectum.  He denies any nocturnal diarrhea.  He lives alone. He has history of chronic tobacco use  NSAIDs: None  Antiplts/Anticoagulants/Anti thrombotics: None  GI Procedures: Reports having had a colonoscopy 15 to 20 years ago  Past Medical History:  Diagnosis Date  . COPD (chronic obstructive pulmonary disease) ([place])    pt denies.  . Hypertension   . Swelling of lower extremity    bilateral  . Wears dentures    full upper and lower - do not fit well.    Past Surgical History:  Procedure Laterality Date  . CATARACT EXTRACTION W/PHACO Right 09/30/2015   Procedure: CATARACT EXTRACTION PHACO AND INTRAOCULAR LENS PLACEMENT ([place]);  Surgeon: Leandrew Koyanagi, MD;  Location: [facility];   Service: Ophthalmology;  Laterality: Right;  . COLONOSCOPY  2009?  Marland Kitchen HERNIA REPAIR Right    x2     Current Outpatient Medications:  .  acetaminophen (TYLENOL) 500 MG tablet, Take 500 mg by mouth every 4 (four) hours as needed. , Disp: , Rfl:  .  amitriptyline (ELAVIL) 10 MG tablet, Take by mouth., Disp: , Rfl:  .  amLODipine (NORVASC) 10 MG tablet, Take 10 mg by mouth daily. PM, Disp: , Rfl:  .  aspirin 81 MG tablet, Take 81 mg by mouth daily. PM, Disp: , Rfl:  .  cetirizine (ZYRTEC) 10 MG tablet, Take by mouth., Disp: , Rfl:  .  cyanocobalamin 500 MCG tablet, Take 500 mcg by mouth daily. PM, Disp: , Rfl:  .  cyclobenzaprine (FLEXERIL) 5 MG tablet, Take by mouth., Disp: , Rfl:  .  finasteride (PROSCAR) 5 MG tablet, TAKE 1 TABLET BY MOUTH EVERY DAY, Disp: 90 tablet, Rfl: 3 .  hydrochlorothiazide (HYDRODIURIL) 25 MG tablet, Take 25 mg by mouth daily. PM, Disp: , Rfl:  .  metoprolol succinate (TOPROL-XL) 50 MG 24 hr tablet, Take 50 mg by mouth daily., Disp: , Rfl:  .  Na Sulfate-K Sulfate-Mg Sulf 17.5-3.13-1.6 GM/177ML SOLN, Take 354 mLs by mouth once for 1 dose., Disp: 354 mL, Rfl: 0 .  nortriptyline (PAMELOR) 10 MG capsule, Take 10 mg by mouth at bedtime., Disp: , Rfl:  .  polyethylene  glycol powder (GLYCOLAX/MIRALAX) powder, TAKE 17G ONCE DAILY AS NEEDED FOR CONSTIPATION (ADJUST AMOUNT AS NEEDED) MIX IN 4-8 OZ OF FLUID, Disp: , Rfl:  .  Propylene Glycol (SYSTANE BALANCE) 0.6 % SOLN, INSTILL 3 5 TIMES A DAY IN EACH EYE AS NEEDED FOR DRYNESS., Disp: , Rfl:  .  tamsulosin (FLOMAX) 0.4 MG CAPS capsule, TAKE 1 CAPSULE BY MOUTH EVERY DAY, Disp: 90 capsule, Rfl: 3   Family History  Problem Relation Age of Onset  . Prostate cancer Brother   . Alzheimer's disease Mother   . Stroke Father   . Kidney cancer Neg Hx   . Kidney disease Neg Hx   . Bladder Cancer Neg Hx      Social History   Tobacco Use  . Smoking status: Current Every Day Smoker    Packs/day: 0.50    Years: 45.00    Pack  years: 22.50    Types: Cigarettes  . Smokeless tobacco: Never Used  Substance Use Topics  . Alcohol use: No  . Drug use: No    Allergies as of 12/21/2020  . (No Known Allergies)    Review of Systems:    All systems reviewed and negative except where noted in HPI.   Physical Exam:  BP (!) 105/52 (BP Location: Left Arm, Patient Position: Sitting, Cuff Size: Normal)   Pulse 81   Temp (!) 97.4 F (36.3 C) (Oral)   Ht 6' (1.829 m)   Wt 127 lb 6 oz (57.8 kg)   BMI 17.28 kg/m  No LMP for male patient.  General:   Alert, cachectic appearing, thin built, pleasant and cooperative in NAD Head:  Normocephalic and atraumatic, bitemporal wasting. Eyes:  Sclera clear, no icterus.   Conjunctiva pink. Ears:  Normal auditory acuity. Nose:  No deformity, discharge, or lesions. Mouth:  No deformity or lesions,oropharynx pink & moist. Neck:  Supple; no masses or thyromegaly. Lungs:  Respirations even and unlabored.  Clear throughout to auscultation.   No wheezes, crackles, or rhonchi. No acute distress. Heart:  Regular rate and rhythm; no murmurs, clicks, rubs, or gallops. Abdomen:  Normal bowel sounds. Soft, scaphoid, non-tender and non-distended without masses, hepatosplenomegaly or hernias noted.  No guarding or rebound tenderness.   Rectal: Not performed Msk:  Symmetrical without gross deformities. Good, equal movement & strength bilaterally. Pulses:  Normal pulses noted. Extremities:  No clubbing or edema.  No cyanosis. Neurologic:  Alert and oriented x3;  grossly normal neurologically. Skin:  Intact without significant lesions or rashes. No jaundice. Lymph Nodes:  No significant cervical adenopathy. Psych:  Alert and cooperative. Normal mood and affect.  Imaging Studies: Reviewed  Assessment and Plan:   Jacob [name] is a 87 y.o. male with history of chronic tobacco use, history of COPD not on home oxygen is seen in consultation for rectal bleeding and possible rectal mass based on  the CT scan.  Patient also has loose stools.  His hemoglobin is elevated secondary to polycythemia from chronic tobacco use  Recommend colonoscopy for further evaluation, discussed the importance of colonoscopy given the CT findings with the patient and he is agreeable to undergo to rule out malignancy  Loose stools If colonoscopy is negative, will evaluate for exocrine pancreatic insufficiency given history of chronic tobacco use CT revealed normal pancreas, no evidence of chronic pancreatitis   Follow up based on above work-up   Cephas Darby, MD

## 2020-12-23 ENCOUNTER — Telehealth: Payer: Self-pay | Admitting: Gastroenterology

## 2020-12-23 NOTE — Telephone Encounter (Signed)
Patient states he just wants [place] to prescribed a medication and not have a procedure. Explained to patient that could not be done because we have to do procedure to see what going on. Patient states he is going to get his call to call us to go over the procedure instructions with him

## 2020-12-23 NOTE — Telephone Encounter (Signed)
Patient walked in and wanted to "talk to someone."  I explained to patient that he would need an appointment or we could call him.  Difficult to understand.  Patient said he was unable to get his meds.

## 2021-01-01 ENCOUNTER — Other Ambulatory Visit: Payer: Self-pay

## 2021-01-01 ENCOUNTER — Encounter: Payer: Self-pay | Admitting: Emergency Medicine

## 2021-01-01 ENCOUNTER — Emergency Department
Admission: EM | Admit: 2021-01-01 | Discharge: 2021-01-01 | Disposition: A | Discharge status: Home / Self Care | Payer: Medicare Other | Attending: Emergency Medicine | Admitting: Emergency Medicine

## 2021-01-01 DIAGNOSIS — I129 Hypertensive chronic kidney disease with stage 1 through stage 4 chronic kidney disease, or unspecified chronic kidney disease: Secondary | ICD-10-CM | POA: Diagnosis not present

## 2021-01-01 DIAGNOSIS — F17210 Nicotine dependence, cigarettes, uncomplicated: Secondary | ICD-10-CM | POA: Insufficient documentation

## 2021-01-01 DIAGNOSIS — R197 Diarrhea, unspecified: Secondary | ICD-10-CM | POA: Insufficient documentation

## 2021-01-01 DIAGNOSIS — Z7982 Long term (current) use of aspirin: Secondary | ICD-10-CM | POA: Insufficient documentation

## 2021-01-01 DIAGNOSIS — Z79899 Other long term (current) drug therapy: Secondary | ICD-10-CM | POA: Insufficient documentation

## 2021-01-01 DIAGNOSIS — N182 Chronic kidney disease, stage 2 (mild): Secondary | ICD-10-CM | POA: Insufficient documentation

## 2021-01-01 DIAGNOSIS — J449 Chronic obstructive pulmonary disease, unspecified: Secondary | ICD-10-CM | POA: Diagnosis not present

## 2021-01-01 LAB — CBC
HCT: 52.5 % — ABNORMAL HIGH (ref 39.0–52.0)
Hemoglobin: 17 g/dL (ref 13.0–17.0)
MCH: 28.6 pg (ref 26.0–34.0)
MCHC: 32.4 g/dL (ref 30.0–36.0)
MCV: 88.2 fL (ref 80.0–100.0)
Platelets: 520 10*3/uL — ABNORMAL HIGH (ref 150–400)
RBC: 5.95 MIL/uL — ABNORMAL HIGH (ref 4.22–5.81)
RDW: 14 % (ref 11.5–15.5)
WBC: 5.7 10*3/uL (ref 4.0–10.5)
nRBC: 0 % (ref 0.0–0.2)

## 2021-01-01 LAB — COMPREHENSIVE METABOLIC PANEL
ALT: 15 U/L (ref 0–44)
AST: 32 U/L (ref 15–41)
Albumin: 3.9 g/dL (ref 3.5–5.0)
Alkaline Phosphatase: 55 U/L (ref 38–126)
Anion gap: 15 (ref 5–15)
BUN: 27 mg/dL — ABNORMAL HIGH (ref 8–23)
CO2: 28 mmol/L (ref 22–32)
Calcium: 9.6 mg/dL (ref 8.9–10.3)
Chloride: 97 mmol/L — ABNORMAL LOW (ref 98–111)
Creatinine, Ser: 1.26 mg/dL — ABNORMAL HIGH (ref 0.61–1.24)
GFR, Estimated: 56 mL/min — ABNORMAL LOW (ref >60)
Glucose, Bld: 108 mg/dL — ABNORMAL HIGH (ref 70–99)
Potassium: 3.5 mmol/L (ref 3.5–5.1)
Sodium: 140 mmol/L (ref 135–145)
Total Bilirubin: 1.4 mg/dL — ABNORMAL HIGH (ref 0.3–1.2)
Total Protein: 7.9 g/dL (ref 6.5–8.1)

## 2021-01-01 LAB — LIPASE, BLOOD: Lipase: 22 U/L (ref 11–51)

## 2021-01-01 NOTE — ED Provider Notes (Signed)
[facility] Emergency Department Provider Note   ____________________________________________    I have reviewed the triage vital signs and the nursing notes.   HISTORY  Chief Complaint Diarrhea     HPI Jacob [name] is a 87 y.o. male who presents with complaints of diarrhea.  Patient reports he has had diarrhea for nearly 1 month.  Patient was evaluated in the emergency department had a CT scan which demonstrated some abnormalities of the colon.  He has followed with GI who is recommending colonoscopy and is trying to schedule him for this.  He is frustrated because he keeps having incontinence of stool sometimes when he stands up abruptly.  He is requesting medication to help with this.  Past Medical History:  Diagnosis Date  . Chronic constipation 06/25/2015  . COPD (chronic obstructive pulmonary disease) ([place])    pt denies.  . Hypertension   . Swelling of lower extremity    bilateral  . Wears dentures    full upper and lower - do not fit well.    Patient Active Problem List   Diagnosis Date Noted  . Vitiligo 10/04/2019  . Vitamin D deficiency 12/28/2018  . Right inguinal hernia 06/21/2018  . Chronic kidney insufficiency, stage 2 (mild) 02/02/2018  . Chronic bronchitis, simple ([place]) 12/28/2016  . High risk medication use 12/24/2015  . Cigarette smoker 12/24/2015  . Benign essential hypertension 06/24/2014  . Benign prostatic hyperplasia 06/24/2014  . Back pain 06/24/2014  . B12 deficiency 06/24/2014    Past Surgical History:  Procedure Laterality Date  . CATARACT EXTRACTION W/PHACO Right 09/30/2015   Procedure: CATARACT EXTRACTION PHACO AND INTRAOCULAR LENS PLACEMENT ([place]);  Surgeon: Leandrew Koyanagi, MD;  Location: [place];  Service: Ophthalmology;  Laterality: Right;  . COLONOSCOPY  2009?  Marland Kitchen HERNIA REPAIR Right    x2     Prior to Admission medications   Medication Sig Start Date End Date Taking? Authorizing  Provider  acetaminophen (TYLENOL) 500 MG tablet Take 500 mg by mouth every 4 (four) hours as needed.     [provider]  amitriptyline (ELAVIL) 10 MG tablet Take by mouth. 09/11/20   [provider]  amLODipine (NORVASC) 10 MG tablet Take 10 mg by mouth daily. PM    [provider]  aspirin 81 MG tablet Take 81 mg by mouth daily. PM    [provider]  cetirizine (ZYRTEC) 10 MG tablet Take by mouth.    [provider]  cyanocobalamin 500 MCG tablet Take 500 mcg by mouth daily. PM    [provider]  cyclobenzaprine (FLEXERIL) 5 MG tablet Take by mouth. 07/08/20   [provider]  finasteride (PROSCAR) 5 MG tablet TAKE 1 TABLET BY MOUTH EVERY DAY 05/08/20   Vaillancourt, Aldona Bar, PA-C  hydrochlorothiazide (HYDRODIURIL) 25 MG tablet Take 25 mg by mouth daily. PM    [provider]  metoprolol succinate (TOPROL-XL) 50 MG 24 hr tablet Take 50 mg by mouth daily. 08/03/20   [provider]  nortriptyline (PAMELOR) 10 MG capsule Take 10 mg by mouth at bedtime. 12/18/19   [provider]  polyethylene glycol powder (GLYCOLAX/MIRALAX) powder TAKE 17G ONCE DAILY AS NEEDED FOR CONSTIPATION (ADJUST AMOUNT AS NEEDED) MIX IN 4-8 OZ OF FLUID 08/08/16   [provider]  Propylene Glycol (SYSTANE BALANCE) 0.6 % SOLN INSTILL 3 5 TIMES A DAY IN EACH EYE AS NEEDED FOR DRYNESS. 06/03/19   [provider]  tamsulosin ([place])  0.4 MG CAPS capsule TAKE 1 CAPSULE BY MOUTH EVERY DAY 09/30/20   Zara Council A, PA-C     Allergies Patient has no known allergies.  Family History  Problem Relation Age of Onset  . Prostate cancer Brother   . Alzheimer's disease Mother   . Stroke Father   . Kidney cancer Neg Hx   . Kidney disease Neg Hx   . Bladder Cancer Neg Hx     Social History Social History   Tobacco Use  . Smoking status: Current Every Day Smoker    Packs/day: 0.50    Years: 45.00    Pack years: 22.50     Types: Cigarettes  . Smokeless tobacco: Never Used  Substance Use Topics  . Alcohol use: No  . Drug use: No    Review of Systems  Constitutional: No fever/chills Eyes: No visual changes.  ENT: No sore throat. Cardiovascular: Denies chest pain. Respiratory: Denies shortness of breath. Gastrointestinal: No abdominal pain, diarrhea as above Genitourinary: Negative for dysuria. Musculoskeletal: Negative for back pain. Skin: Negative for rash. Neurological: Negative for headaches or weakness   ____________________________________________   PHYSICAL EXAM:  VITAL SIGNS: ED Triage Vitals  Enc Vitals Group     BP 01/01/21 1223 124/75     Pulse Rate 01/01/21 1223 (!) 46     Resp 01/01/21 1223 16     Temp 01/01/21 1223 97.7 F (36.5 C)     Temp Source 01/01/21 1223 Oral     SpO2 01/01/21 1223 97 %     Weight 01/01/21 1225 57.7 kg (127 lb 3.3 oz)     Height 01/01/21 1225 1.829 m (6')     Head Circumference --      Peak Flow --      Pain Score 01/01/21 1224 0     Pain Loc --      Pain Edu? --      Excl. in [place]? --     Constitutional: [place] No acute distress. Pleasant and interactive  Nose: No congestion/rhinnorhea. Mouth/Throat: Mucous membranes are moist.   Neck:  Painless ROM Cardiovascular: Normal rate, regular rhythm.  Good peripheral circulation. Respiratory: Normal respiratory effort.  No retractions. Gastrointestinal: Soft and nontender. No distention.  No CVA tenderness.  Musculoskeletal: No lower extremity tenderness nor edema.  Warm and well perfused Neurologic:  Normal speech and language. No gross focal neurologic deficits are appreciated.  Skin:  Skin is warm, dry and intact. No rash noted. Psychiatric: Mood and affect are normal. Speech and behavior are normal.  ____________________________________________   LABS (all labs ordered are listed, but only abnormal results are displayed)  Labs Reviewed  COMPREHENSIVE METABOLIC PANEL - Abnormal; Notable  for the following components:      Result Value   Chloride 97 (*)    Glucose, Bld 108 (*)    BUN 27 (*)    Creatinine, Ser 1.26 (*)    Total Bilirubin 1.4 (*)    GFR, Estimated 56 (*)    All other components within normal limits  CBC - Abnormal; Notable for the following components:   RBC 5.95 (*)    HCT 52.5 (*)    Platelets 520 (*)    All other components within normal limits  LIPASE, BLOOD   ____________________________________________  EKG  ED ECG REPORT I, Lavonia Drafts, the attending physician, personally viewed and interpreted this ECG.  Date: 01/01/2021  Rhythm: normal sinus rhythm QRS Axis: normal Intervals: PVCs ST/T Wave abnormalities: Nonspecific abnormality Narrative  Interpretation: no evidence of acute ischemia  ____________________________________________  RADIOLOGY  None ____________________________________________   PROCEDURES  Procedure(s) performed: No  Procedures   Critical Care performed: No ____________________________________________   INITIAL IMPRESSION / ASSESSMENT AND PLAN / ED COURSE  Pertinent labs & imaging results that were available during my care of the patient were reviewed by me and considered in my medical decision making (see chart for details).  Patient presents with over a month of diarrhea which is being worked up by GI, he is frustrated that we cannot provide a medication to help with this.  Strongly urged him to schedule his colonoscopy given that there is some concern about abnormalities on the CT scan    ____________________________________________   FINAL CLINICAL IMPRESSION(S) / ED DIAGNOSES  Final diagnoses:  Diarrhea, unspecified type        Note:  This document was prepared using Dragon voice recognition software and may include unintentional dictation errors.   Lavonia Drafts, MD 01/01/21 2218

## 2021-01-01 NOTE — ED Triage Notes (Signed)
Pt to ED via POV stating that he has had diarrhea x 1 month. Pt states that he has colonoscopy scheduled for 1/26. Pt is in NAD at this time.

## 2021-01-04 ENCOUNTER — Other Ambulatory Visit
Admission: RE | Admit: 2021-01-04 | Discharge: 2021-01-04 | Disposition: A | Discharge status: Home / Self Care | Payer: Medicare Other | Source: Ambulatory Visit | Attending: Gastroenterology | Admitting: Gastroenterology

## 2021-01-04 ENCOUNTER — Other Ambulatory Visit: Payer: Self-pay

## 2021-01-04 DIAGNOSIS — Z01812 Encounter for preprocedural laboratory examination: Secondary | ICD-10-CM | POA: Insufficient documentation

## 2021-01-04 DIAGNOSIS — Z20822 Contact with and (suspected) exposure to covid-19: Secondary | ICD-10-CM | POA: Diagnosis not present

## 2021-01-04 LAB — SARS CORONAVIRUS 2 (TAT 6-24 HRS): SARS Coronavirus 2: NEGATIVE

## 2021-01-06 ENCOUNTER — Encounter
Admission: RE | Disposition: A | Discharge status: Home / Self Care | Payer: Self-pay | Source: Home / Self Care | Attending: Gastroenterology

## 2021-01-06 ENCOUNTER — Ambulatory Visit: Payer: Medicare Other | Admitting: Certified Registered Nurse Anesthetist

## 2021-01-06 ENCOUNTER — Encounter: Payer: Self-pay | Admitting: Gastroenterology

## 2021-01-06 ENCOUNTER — Ambulatory Visit
Admission: RE | Admit: 2021-01-06 | Discharge: 2021-01-06 | Disposition: A | Discharge status: Home / Self Care | Payer: Medicare Other | Attending: Gastroenterology | Admitting: Gastroenterology

## 2021-01-06 ENCOUNTER — Other Ambulatory Visit: Payer: Self-pay

## 2021-01-06 ENCOUNTER — Telehealth: Payer: Self-pay

## 2021-01-06 DIAGNOSIS — Z7982 Long term (current) use of aspirin: Secondary | ICD-10-CM | POA: Insufficient documentation

## 2021-01-06 DIAGNOSIS — Z823 Family history of stroke: Secondary | ICD-10-CM | POA: Insufficient documentation

## 2021-01-06 DIAGNOSIS — J449 Chronic obstructive pulmonary disease, unspecified: Secondary | ICD-10-CM | POA: Insufficient documentation

## 2021-01-06 DIAGNOSIS — F17210 Nicotine dependence, cigarettes, uncomplicated: Secondary | ICD-10-CM | POA: Insufficient documentation

## 2021-01-06 DIAGNOSIS — Z820 Family history of epilepsy and other diseases of the nervous system: Secondary | ICD-10-CM | POA: Insufficient documentation

## 2021-01-06 DIAGNOSIS — Z79899 Other long term (current) drug therapy: Secondary | ICD-10-CM | POA: Diagnosis not present

## 2021-01-06 DIAGNOSIS — C20 Malignant neoplasm of rectum: Secondary | ICD-10-CM | POA: Insufficient documentation

## 2021-01-06 DIAGNOSIS — K625 Hemorrhage of anus and rectum: Secondary | ICD-10-CM | POA: Insufficient documentation

## 2021-01-06 DIAGNOSIS — Z8042 Family history of malignant neoplasm of prostate: Secondary | ICD-10-CM | POA: Diagnosis not present

## 2021-01-06 HISTORY — DX: Benign prostatic hyperplasia without lower urinary tract symptoms: N40.0

## 2021-01-06 HISTORY — PX: COLONOSCOPY WITH PROPOFOL: SHX5780

## 2021-01-06 SURGERY — COLONOSCOPY WITH PROPOFOL
Anesthesia: General

## 2021-01-06 MED ORDER — LIDOCAINE HCL (CARDIAC) PF 100 MG/5ML IV SOSY
PREFILLED_SYRINGE | INTRAVENOUS | Status: DC | PRN
  Administered 2021-01-06: 60 mg via INTRAVENOUS

## 2021-01-06 MED ORDER — PROPOFOL 10 MG/ML IV BOLUS
INTRAVENOUS | Status: DC | PRN
  Administered 2021-01-06: 80 mg via INTRAVENOUS

## 2021-01-06 MED ORDER — SODIUM CHLORIDE 0.9 % IV SOLN: INTRAVENOUS | Status: DC

## 2021-01-06 MED ORDER — PROPOFOL 500 MG/50ML IV EMUL
INTRAVENOUS | Status: DC | PRN
  Administered 2021-01-06: 80 ug/kg/min via INTRAVENOUS

## 2021-01-06 MED ORDER — PROPOFOL 10 MG/ML IV BOLUS
INTRAVENOUS | Status: AC
  Filled 2021-01-06: qty 40

## 2021-01-06 MED ORDER — PROPOFOL 500 MG/50ML IV EMUL
INTRAVENOUS | Status: AC
  Filled 2021-01-06: qty 100

## 2021-01-06 MED ORDER — LIDOCAINE HCL (PF) 2 % IJ SOLN
INTRAMUSCULAR | Status: AC
  Filled 2021-01-06: qty 5

## 2021-01-06 NOTE — Telephone Encounter (Signed)
Place urgent referral per Dr. Marius Ditch request

## 2021-01-06 NOTE — Anesthesia Procedure Notes (Signed)
Procedure Name: MAC Date/Time: 01/06/2021 8:20 AM Performed by: Lily Peer, Annajulia Lewing, CRNA Pre-anesthesia Checklist: Patient identified, Emergency Drugs available, Suction available, Patient being monitored and Timeout performed Patient Re-evaluated:Patient Re-evaluated prior to induction Oxygen Delivery Method: Nasal cannula Induction Type: IV induction

## 2021-01-06 NOTE — Anesthesia Postprocedure Evaluation (Signed)
Anesthesia Post Note  Patient: Jacob [name]  Procedure(s) Performed: COLONOSCOPY WITH PROPOFOL (N/A )  Patient location during evaluation: Endoscopy Anesthesia Type: General Level of consciousness: awake and alert and oriented Pain management: pain level controlled Vital Signs Assessment: post-procedure vital signs reviewed and stable Respiratory status: spontaneous breathing Cardiovascular status: blood pressure returned to baseline Anesthetic complications: no   No complications documented.   Last Vitals:  Vitals:   01/06/21 0849 01/06/21 0859  BP: (!) 153/97 (!) 153/106  Pulse:    Resp:    Temp:    SpO2:      Last Pain:  Vitals:   01/06/21 0859  TempSrc:   PainSc: 0-No pain                 Irys Nigh

## 2021-01-06 NOTE — H&P (Signed)
Cephas Darby, MD [address]  [place]  [place], [place] [address]  Main: [phone]  Fax: [phone] Pager: [phone]  Primary Care Physician:  [name], MD Primary Gastroenterologist:  Dr. Cephas Darby  Pre-Procedure History & Physical: HPI:  Jacob [name] is a 88 y.o. male is here for an colonoscopy.   Past Medical History:  Diagnosis Date  . BPH (benign prostatic hyperplasia)   . Chronic constipation 06/25/2015  . COPD (chronic obstructive pulmonary disease) ([place])    pt denies.  . Hypertension   . Swelling of lower extremity    bilateral  . Wears dentures    full upper and lower - do not fit well.    Past Surgical History:  Procedure Laterality Date  . CATARACT EXTRACTION W/PHACO Right 09/30/2015   Procedure: CATARACT EXTRACTION PHACO AND INTRAOCULAR LENS PLACEMENT ([place]);  Surgeon: Leandrew Koyanagi, MD;  Location: [place];  Service: Ophthalmology;  Laterality: Right;  . COLONOSCOPY  2009?  Marland Kitchen HERNIA REPAIR Right    x2     Prior to Admission medications   Medication Sig Start Date End Date Taking? Authorizing Provider  acetaminophen (TYLENOL) 500 MG tablet Take 500 mg by mouth every 4 (four) hours as needed.     [provider]  amitriptyline (ELAVIL) 10 MG tablet Take by mouth. 09/11/20   [provider]  amLODipine (NORVASC) 10 MG tablet Take 10 mg by mouth daily. PM    [provider]  aspirin 81 MG tablet Take 81 mg by mouth daily. PM    [provider]  cetirizine (ZYRTEC) 10 MG tablet Take by mouth.    [provider]  cyanocobalamin 500 MCG tablet Take 500 mcg by mouth daily. PM    [provider]  cyclobenzaprine (FLEXERIL) 5 MG tablet Take by mouth. 07/08/20   [provider]  finasteride (PROSCAR) 5 MG tablet TAKE 1 TABLET BY MOUTH EVERY DAY 05/08/20   Vaillancourt, Aldona Bar, PA-C  hydrochlorothiazide (HYDRODIURIL) 25 MG tablet Take 25 mg by mouth daily. PM     [provider]  metoprolol succinate (TOPROL-XL) 50 MG 24 hr tablet Take 50 mg by mouth daily. 08/03/20   [provider]  nortriptyline (PAMELOR) 10 MG capsule Take 10 mg by mouth at bedtime. 12/18/19   [provider]  polyethylene glycol powder (GLYCOLAX/MIRALAX) powder TAKE 17G ONCE DAILY AS NEEDED FOR CONSTIPATION (ADJUST AMOUNT AS NEEDED) MIX IN 4-8 OZ OF FLUID 08/08/16   [provider]  Propylene Glycol (SYSTANE BALANCE) 0.6 % SOLN INSTILL 3 5 TIMES A DAY IN [facility] EYE AS NEEDED FOR DRYNESS. 06/03/19   [provider]  tamsulosin (FLOMAX) 0.4 MG CAPS capsule TAKE 1 CAPSULE BY MOUTH EVERY DAY 09/30/20   McGowan, Larene Beach A, PA-C    Allergies as of 12/21/2020  . (No Known Allergies)    Family History  Problem Relation Age of Onset  . Prostate cancer Brother   . Alzheimer's disease Mother   . Stroke Father   . Kidney cancer Neg Hx   . Kidney disease Neg Hx   . Bladder Cancer Neg Hx     Social History   Socioeconomic History  . Marital status: Married    Spouse name: Not on file  . Number of children: Not on file  . Years of education: Not on file  . Highest education level: Not on file  Occupational History  . Not on file  Tobacco Use  . Smoking  status: Current Every Day Smoker    Packs/day: 0.50    Years: 45.00    Pack years: 22.50    Types: Cigarettes  . Smokeless tobacco: Never Used  Vaping Use  . Vaping Use: Never used  Substance and Sexual Activity  . Alcohol use: No  . Drug use: No  . Sexual activity: Not on file  Other Topics Concern  . Not on file  Social History Narrative  . Not on file   Social Determinants of Health   Financial Resource Strain: Not on file  Food Insecurity: Not on file  Transportation Needs: Not on file  Physical Activity: Not on file  Stress: Not on file  Social Connections: Not on file  Intimate Partner Violence: Not on file    Review of Systems: See HPI, otherwise negative  ROS  Physical Exam: BP (!) 157/108   Pulse 88   Temp 98.7 F (37.1 C) (Temporal)   Resp 16   Ht 6' (1.829 m)   Wt 60.8 kg   SpO2 98%   BMI 18.17 kg/m  General:   Alert,  pleasant and cooperative in NAD Head:  Normocephalic and atraumatic. Neck:  Supple; no masses or thyromegaly. Lungs:  Clear throughout to auscultation.    Heart:  Regular rate and rhythm. Abdomen:  Soft, nontender and nondistended. Normal bowel sounds, without guarding, and without rebound.   Neurologic:  Alert and  oriented x4;  grossly normal neurologically.  Impression/Plan: Jacob [name] is here for an colonoscopy to be performed for rectal bleeding  Risks, benefits, limitations, and alternatives regarding  colonoscopy have been reviewed with the patient.  Questions have been answered.  All parties agreeable.   Sherri Sear, MD  01/06/2021, 8:12 AM

## 2021-01-06 NOTE — Telephone Encounter (Signed)
-----   Message from Lin Landsman, MD sent at 01/06/2021 10:36 AM EST ----- Regarding: Rectal mass Please make an urgent referral to oncology, Dr. Janese Banks Dx: New diagnosis of rectal cancer  Thanks RV

## 2021-01-06 NOTE — Anesthesia Preprocedure Evaluation (Signed)
Anesthesia Evaluation  Patient identified by MRN, date of birth, ID band Patient awake    Reviewed: Allergy & Precautions, H&P , Patient's Chart, lab work & pertinent test results  Airway Mallampati: I  TM Distance: >3 FB Neck ROM: full    Dental no notable dental hx. (+) Upper Dentures, Lower Dentures   Pulmonary Current Smoker and Patient abstained from smoking.,    Pulmonary exam normal        Cardiovascular hypertension, Normal cardiovascular exam     Neuro/Psych    GI/Hepatic negative GI ROS, Neg liver ROS,   Endo/Other  negative endocrine ROS  Renal/GU Renal disease     Musculoskeletal   Abdominal   Peds  Hematology negative hematology ROS (+)   Anesthesia Other Findings   Reproductive/Obstetrics                             Anesthesia Physical  Anesthesia Plan  ASA: II  Anesthesia Plan: General   Post-op Pain Management:    Induction: Intravenous  PONV Risk Score and Plan:   Airway Management Planned: Nasal Cannula  Additional Equipment:   Intra-op Plan:   Post-operative Plan:   Informed Consent: I have reviewed the patients History and Physical, chart, labs and discussed the procedure including the risks, benefits and alternatives for the proposed anesthesia with the patient or authorized representative who has indicated his/her understanding and acceptance.       Plan Discussed with: CRNA  Anesthesia Plan Comments:         Anesthesia Quick Evaluation

## 2021-01-06 NOTE — Transfer of Care (Signed)
Immediate Anesthesia Transfer of Care Note  Patient: Jacob [name]  Procedure(s) Performed: COLONOSCOPY WITH PROPOFOL (N/A )  Patient Location: PACU and Endoscopy Unit  Anesthesia Type:General  Level of Consciousness: awake and patient cooperative  Airway & Oxygen Therapy: Patient Spontanous Breathing  Post-op Assessment: Report given to RN and Post -op Vital signs reviewed and stable  Post vital signs: Reviewed and stable  Last Vitals:  Vitals Value Taken Time  BP 117/64 01/06/21 0829  Temp 36.7 C 01/06/21 0829  Pulse 83 01/06/21 0829  Resp 24 01/06/21 0829  SpO2 100 % 01/06/21 0829    Last Pain:  Vitals:   01/06/21 0829  TempSrc: Temporal  PainSc: Asleep         Complications: No complications documented.

## 2021-01-06 NOTE — Op Note (Signed)
[facility] Patient Name: Jacob [name] Procedure Date: 01/06/2021 7:21 AM MRN: [id number] Account #: [id number] Date of Birth: 1933-12-20 Admit Type: Outpatient Age: 87 Room: [facility] ENDO ROOM 4 Gender: Male Note Status: Finalized Procedure:             Colonoscopy Indications:           Rectal bleeding Providers:             Lin Landsman MD, MD Referring MD:          Christena Flake. Raechel Ache, MD (Referring MD) Medicines:             General Anesthesia Complications:         No immediate complications. Estimated blood loss:                         Minimal. Procedure:             Pre-Anesthesia Assessment:                        - Prior to the procedure, a History and Physical was                         performed, and patient medications and allergies were                         reviewed. The patient is competent. The risks and                         benefits of the procedure and the sedation options and                         risks were discussed with the patient. All questions                         were answered and informed consent was obtained.                         Patient identification and proposed procedure were                         verified by the physician, the nurse, the                         anesthesiologist, the anesthetist and the technician                         in the pre-procedure area in the procedure room in the                         endoscopy suite. Mental Status Examination: alert and                         oriented. Airway Examination: normal oropharyngeal                         airway and neck mobility. Respiratory Examination:  clear to auscultation. CV Examination: normal.                         Prophylactic Antibiotics: The patient does not require                         prophylactic antibiotics. Prior Anticoagulants: The                         patient has taken no previous anticoagulant  or                         antiplatelet agents. ASA Grade Assessment: II - A                         patient with mild systemic disease. After reviewing                         the risks and benefits, the patient was deemed in                         satisfactory condition to undergo the procedure. The                         anesthesia plan was to use general anesthesia.                         Immediately prior to administration of medications,                         the patient was re-assessed for adequacy to receive                         sedatives. The heart rate, respiratory rate, oxygen                         saturations, blood pressure, adequacy of pulmonary                         ventilation, and response to care were monitored                         throughout the procedure. The physical status of the                         patient was re-assessed after the procedure.                        After obtaining informed consent, the colonoscope was                         passed under direct vision. Throughout the procedure,                         the patient's blood pressure, pulse, and oxygen                         saturations were monitored continuously. The  Colonoscope was introduced through the anus and                         advanced to the the rectum to examine a mass. This was                         the intended extent. The colonoscopy was extremely                         difficult due to a partially obstructing mass. The                         patient tolerated the procedure well. The quality of                         the bowel preparation was adequate. Findings:      The digital rectal exam revealed a hard, fixed and obstructing rectal       mass palpated 1.0 cm from the anal verge. The mass was circumferential.      A frond-like/villous and infiltrative completely obstructing large mass       was found in the distal rectum. The mass was  circumferential. Oozing was       present. Biopsies were taken with a cold forceps for histology.       Estimated blood loss was minimal. Impression:            - Rectal mass 1.0 cm from the anal verge.                        - Malignant near completely obstructing tumor in the                         distal rectum. Biopsied. Recommendation:        - Discharge patient to home (with escort).                        - Resume previous diet today.                        - Continue present medications.                        - Await pathology results.                        - Refer to an oncologist at appointment to be                         scheduled.                        - Miralax daily Procedure Code(s):     --- Professional ---                        [phone], 52, Colonoscopy, flexible; with biopsy, single                         or multiple Diagnosis Code(s):     --- Professional ---  K62.89, Other specified diseases of anus and rectum                        C20, Malignant neoplasm of rectum                        K56.691, Other complete intestinal obstruction                        K62.5, Hemorrhage of anus and rectum CPT copyright 2019 American Medical Association. All rights reserved. The codes documented in this report are preliminary and upon coder review may  be revised to meet current compliance requirements. Dr. Ulyess Mort Lin Landsman MD, MD 01/06/2021 8:31:18 AM This report has been signed electronically. Number of Addenda: 0 Note Initiated On: 01/06/2021 7:21 AM Scope Withdrawal Time: 0 hours 0 minutes 2 seconds  Total Procedure Duration: 0 hours 2 minutes 50 seconds  Estimated Blood Loss:  Estimated blood loss was minimal.      [facility]

## 2021-01-07 ENCOUNTER — Encounter: Payer: Self-pay | Admitting: Gastroenterology

## 2021-01-11 ENCOUNTER — Inpatient Hospital Stay: Payer: Medicare Other | Attending: Oncology | Admitting: Oncology

## 2021-01-11 ENCOUNTER — Other Ambulatory Visit: Payer: Self-pay

## 2021-01-11 ENCOUNTER — Inpatient Hospital Stay: Payer: Medicare Other

## 2021-01-11 ENCOUNTER — Encounter: Payer: Self-pay | Admitting: Oncology

## 2021-01-11 VITALS — BP 128/75 | HR 66 | Temp 97.9°F | Resp 16 | Ht 72.0 in | Wt 129.5 lb

## 2021-01-11 DIAGNOSIS — F17210 Nicotine dependence, cigarettes, uncomplicated: Secondary | ICD-10-CM | POA: Insufficient documentation

## 2021-01-11 DIAGNOSIS — C20 Malignant neoplasm of rectum: Secondary | ICD-10-CM

## 2021-01-11 DIAGNOSIS — C61 Malignant neoplasm of prostate: Secondary | ICD-10-CM | POA: Insufficient documentation

## 2021-01-11 DIAGNOSIS — Z803 Family history of malignant neoplasm of breast: Secondary | ICD-10-CM | POA: Insufficient documentation

## 2021-01-11 DIAGNOSIS — Z8042 Family history of malignant neoplasm of prostate: Secondary | ICD-10-CM | POA: Diagnosis not present

## 2021-01-11 DIAGNOSIS — N400 Enlarged prostate without lower urinary tract symptoms: Secondary | ICD-10-CM | POA: Diagnosis not present

## 2021-01-11 DIAGNOSIS — I129 Hypertensive chronic kidney disease with stage 1 through stage 4 chronic kidney disease, or unspecified chronic kidney disease: Secondary | ICD-10-CM | POA: Diagnosis not present

## 2021-01-11 DIAGNOSIS — N182 Chronic kidney disease, stage 2 (mild): Secondary | ICD-10-CM | POA: Insufficient documentation
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
## 2021-01-11 NOTE — Progress Notes (Signed)
Met with Jacob [name] and his son, Jacob [name]. Introduced nurse navigator services and provided my contact information for future needs. He has 2 other sons that live in this area and one in [place]. Jacob [name] states he checks in on him. He currently lives by himself. His spouse resides in a long term care facility for the last 5-6 years. He reports weight loss of around 6 pounds. He is hard of hearing and seems slightly forgetful.  Went over upcoming MRI and CT scan as well as instructions with Jacob [name] and his son. He does have an upcoming appointment with Dr. [name] on same day as CT. If he cannot do both, we will reschedule one of these.Referral faxed to [facility].  He will be scheduled to see Dr Chrystal in radiation oncology same date as follow up with Dr. Rao.  Encouraged to call with any questions or concerns. 

## 2021-01-11 NOTE — Research (Signed)
Trial:  [employer] 2018/01 Patient Jacob [name] was identified by this research nurse as a potential candidate for the above listed study.  This Clinical Research nurse met with [name], [id number], on 01/11/21 in a manner and location that ensures patient privacy to discuss participation in the above listed research study.  Patient is accompanied by his son [name]. A copy of the informed consent document and separate HIPAA Authorization was provided to the patient.  Patient reads, speaks, and understands [place].   Patient was provided with the business card of this Nurse and encouraged to contact the research team with any questions.  Approximately 20 minutes were spent with the patient reviewing the informed consent documents.  Patient was provided the option of taking informed consent documents home to review and was encouraged to review at their convenience with their support network, including other care providers. Patient took the consent documents home to review.  Jeral Fruit, RN  01/11/21 2:57 PM

## 2021-01-12 ENCOUNTER — Other Ambulatory Visit: Payer: Self-pay | Admitting: Urology

## 2021-01-12 ENCOUNTER — Telehealth: Payer: Self-pay | Admitting: *Deleted

## 2021-01-12 ENCOUNTER — Other Ambulatory Visit: Payer: Self-pay | Admitting: *Deleted

## 2021-01-12 ENCOUNTER — Telehealth: Payer: Self-pay

## 2021-01-12 DIAGNOSIS — C61 Malignant neoplasm of prostate: Secondary | ICD-10-CM

## 2021-01-12 LAB — SURGICAL PATHOLOGY

## 2021-01-12 NOTE — Telephone Encounter (Signed)
Called pt and son about more testing done on bx and it shows that pt has prostate cancer and not rectal cancer. It is better dx than rectal cancer..  Pt does not need the scans we ordered already so we cancelled scans. Dr Janese Banks does want bone scan and we are working on it. We would like pt and son to come 2/7 to see dr Janese Banks , to see radiation doctor and get labs and start injection for treatment of prostate cancer. Both are agreeable and I will let them know about the bone scan appt once made

## 2021-01-12 NOTE — Telephone Encounter (Signed)
Message left with [facility] to cancel referral to Dr Dema Severin per Dr. Janese Banks

## 2021-01-13 ENCOUNTER — Other Ambulatory Visit: Payer: Self-pay | Admitting: *Deleted

## 2021-01-13 DIAGNOSIS — C61 Malignant neoplasm of prostate: Secondary | ICD-10-CM

## 2021-01-13 NOTE — Progress Notes (Signed)
pylar

## 2021-01-14 ENCOUNTER — Telehealth: Payer: Self-pay

## 2021-01-14 ENCOUNTER — Other Ambulatory Visit: Payer: Self-pay | Admitting: Oncology

## 2021-01-14 ENCOUNTER — Encounter: Payer: Self-pay | Admitting: Oncology

## 2021-01-14 DIAGNOSIS — C20 Malignant neoplasm of rectum: Secondary | ICD-10-CM

## 2021-01-14 DIAGNOSIS — C7951 Secondary malignant neoplasm of bone: Secondary | ICD-10-CM | POA: Insufficient documentation

## 2021-01-14 DIAGNOSIS — C61 Malignant neoplasm of prostate: Secondary | ICD-10-CM | POA: Insufficient documentation

## 2021-01-14 NOTE — Telephone Encounter (Signed)
Research nurse called the patient this am to ascertain his interest in participation in the [place] 2018/01 2018/01B Blood protocol. The patient was given a copy of the consent to review at home with his family on 01/12/2020. The patient states he doesn't want to participate at this time. He states he has too much going on to worry about this right now. Research nurse thanked the patient for his consideration of the study and meeting with the research team. Encouraged the patient to call if he has any other questions or concerns in the future.  Jeral Fruit, RN 01/14/21 10:15 AM.

## 2021-01-14 NOTE — Progress Notes (Addendum)
Hematology/Oncology Consult note [facility] [phone] [phone]  Patient Care Team: [name], MD as PCP - General (Internal Medicine) Clent Jacks, RN as Oncology Nurse Navigator   Name of the patient: Jacob [name]  [id number]  09/03/1934    Reason for referral-rectal mass   Referring physician-Dr. Marius Ditch  Date of visit: 01/14/21   History of presenting illness-patient is a 87 year old male with a past medical history significant for BPH, Chronic bronchitis and stage II CKD who underwent a CT angio abdomen and pelvis with contrast on 10/08/2020 at that time he had diffuse thickening and hyperemia of the distal rectum concerning for potential tumor.  Hyperemia may also suggest infection/proctitis.  This was followed by a colonoscopy on 01/06/2021 which showed a near obstructing mass in the rectum 1 cm from the anal verge.  Scope could not be traversed beyond the mass.  Biopsy was taken and was positive for adenocarcinoma.  Patient is here with his son today and reports that he can still move his bowels but sometimes he has constipation and sometimes diarrhea.  He does not have a good insight into his medical problems.  Denies any significant pain but does report occasional rectal discomfort. He is independent of his ADLs  Rectal biopsy showed adenocarcinoma poorly differentiated.  Tumor was positive for PSA and PSAP but negative for CK7, CK20 and CDX2.  Findings are consistent with adenocarcinoma from prostate origin.  ECOG PS- 1  Pain scale- 3   Review of systems- Review of Systems  Constitutional: Positive for malaise/fatigue. Negative for chills, fever and weight loss.  HENT: Negative for congestion, ear discharge and nosebleeds.   Eyes: Negative for blurred vision.  Respiratory: Negative for cough, hemoptysis, sputum production, shortness of breath and wheezing.   Cardiovascular: Negative for chest pain, palpitations, orthopnea  and claudication.  Gastrointestinal: Negative for abdominal pain, blood in stool, constipation, diarrhea, heartburn, melena, nausea and vomiting.       Rectal discomfort and occasional rectal bleeding  Genitourinary: Negative for dysuria, flank pain, frequency, hematuria and urgency.  Musculoskeletal: Negative for back pain, joint pain and myalgias.  Skin: Negative for rash.  Neurological: Negative for dizziness, tingling, focal weakness, seizures, weakness and headaches.  Endo/Heme/Allergies: Does not bruise/bleed easily.  Psychiatric/Behavioral: Negative for depression and suicidal ideas. The patient does not have insomnia.     No Known Allergies  Patient Active Problem List   Diagnosis Date Noted  . Prostate cancer ([place]) 01/14/2021  . Vitiligo 10/04/2019  . Vitamin D deficiency 12/28/2018  . Right inguinal hernia 06/21/2018  . Chronic kidney insufficiency, stage 2 (mild) 02/02/2018  . Chronic bronchitis, simple ([place]) 12/28/2016  . High risk medication use 12/24/2015  . Cigarette smoker 12/24/2015  . Benign essential hypertension 06/24/2014  . Benign prostatic hyperplasia 06/24/2014  . Back pain 06/24/2014  . B12 deficiency 06/24/2014     Past Medical History:  Diagnosis Date  . BPH (benign prostatic hyperplasia)   . Chronic constipation 06/25/2015  . COPD (chronic obstructive pulmonary disease) ([place])    pt denies.  . Hypertension   . Swelling of lower extremity    bilateral  . Wears dentures    full upper and lower - do not fit well.     Past Surgical History:  Procedure Laterality Date  . CATARACT EXTRACTION W/PHACO Right 09/30/2015   Procedure: CATARACT EXTRACTION PHACO AND INTRAOCULAR LENS PLACEMENT ([place]);  Surgeon: Leandrew Koyanagi, MD;  Location: [place];  Service: Ophthalmology;  Laterality: Right;  . COLONOSCOPY  2009?  . COLONOSCOPY WITH PROPOFOL N/A 01/06/2021   Procedure: COLONOSCOPY WITH PROPOFOL;  Surgeon: Lin Landsman, MD;   Location: [facility] ENDOSCOPY;  Service: Gastroenterology;  Laterality: N/A;  . HERNIA REPAIR Right    x2     Social History   Socioeconomic History  . Marital status: Married    Spouse name: Not on file  . Number of children: Not on file  . Years of education: Not on file  . Highest education level: Not on file  Occupational History  . Not on file  Tobacco Use  . Smoking status: Current Every Day Smoker    Packs/day: 0.25    Years: 45.00    Pack years: 11.25    Types: Cigarettes  . Smokeless tobacco: Never Used  Vaping Use  . Vaping Use: Never used  Substance and Sexual Activity  . Alcohol use: Yes    Comment: drink beer maybe 2 - 3 weeks  . Drug use: No  . Sexual activity: Not on file  Other Topics Concern  . Not on file  Social History Narrative  . Not on file   Social Determinants of Health   Financial Resource Strain: Not on file  Food Insecurity: Not on file  Transportation Needs: Not on file  Physical Activity: Not on file  Stress: Not on file  Social Connections: Not on file  Intimate Partner Violence: Not on file     Family History  Problem Relation Age of Onset  . Prostate cancer Brother   . Alzheimer's disease Mother   . Breast cancer Mother   . Stroke Father   . Kidney cancer Neg Hx   . Kidney disease Neg Hx   . Bladder Cancer Neg Hx      Current Outpatient Medications:  .  acetaminophen (TYLENOL) 500 MG tablet, Take 500 mg by mouth every 4 (four) hours as needed. , Disp: , Rfl:  .  amitriptyline (ELAVIL) 10 MG tablet, 10 mg at bedtime., Disp: , Rfl:  .  amLODipine (NORVASC) 10 MG tablet, Take 10 mg by mouth daily. PM, Disp: , Rfl:  .  aspirin 81 MG tablet, Take 81 mg by mouth daily. PM, Disp: , Rfl:  .  cetirizine (ZYRTEC) 10 MG tablet, Take by mouth., Disp: , Rfl:  .  cyclobenzaprine (FLEXERIL) 5 MG tablet, Take by mouth., Disp: , Rfl:  .  finasteride (PROSCAR) 5 MG tablet, TAKE 1 TABLET BY MOUTH EVERY DAY, Disp: 90 tablet, Rfl: 3 .   metoprolol succinate (TOPROL-XL) 50 MG 24 hr tablet, Take 50 mg by mouth daily., Disp: , Rfl:  .  polyethylene glycol powder (GLYCOLAX/MIRALAX) powder, TAKE 17G ONCE DAILY AS NEEDED FOR CONSTIPATION (ADJUST AMOUNT AS NEEDED) MIX IN 4-8 OZ OF FLUID, Disp: , Rfl:  .  Propylene Glycol (SYSTANE BALANCE) 0.6 % SOLN, INSTILL 3 5 TIMES A DAY IN EACH EYE AS NEEDED FOR DRYNESS., Disp: , Rfl:  .  tamsulosin (FLOMAX) 0.4 MG CAPS capsule, TAKE 1 CAPSULE BY MOUTH EVERY DAY, Disp: 90 capsule, Rfl: 3 .  cyanocobalamin 500 MCG tablet, Take 500 mcg by mouth daily. PM, Disp: , Rfl:  .  hydrochlorothiazide (HYDRODIURIL) 25 MG tablet, Take 25 mg by mouth daily. PM, Disp: , Rfl:  .  nortriptyline (PAMELOR) 10 MG capsule, Take 10 mg by mouth at bedtime., Disp: , Rfl:    Physical exam:  Vitals:   01/11/21 1335  BP: 128/75  Pulse: 66  Resp:  16  Temp: 97.9 F (36.6 C)  TempSrc: Oral  Weight: 129 lb 8 oz (58.7 kg)  Height: 6' (1.829 m)   Physical Exam Constitutional:      Comments: Thin elderly gentleman in no acute distress  Eyes:     Extraocular Movements: EOM normal.  Cardiovascular:     Rate and Rhythm: Normal rate and regular rhythm.     Heart sounds: Normal heart sounds.  Pulmonary:     Effort: Pulmonary effort is normal.     Breath sounds: Normal breath sounds.  Abdominal:     General: Bowel sounds are normal.     Palpations: Abdomen is soft.  Skin:    General: Skin is warm and dry.  Neurological:     Mental Status: He is alert and oriented to person, place, and time.        CMP Latest Ref Rng & Units 01/01/2021  Glucose 70 - 99 mg/dL 108(H)  BUN 8 - 23 mg/dL 27(H)  Creatinine 0.61 - 1.24 mg/dL 1.26(H)  Sodium 135 - 145 mmol/L 140  Potassium 3.5 - 5.1 mmol/L 3.5  Chloride 98 - 111 mmol/L 97(L)  CO2 22 - 32 mmol/L 28  Calcium 8.9 - 10.3 mg/dL 9.6  Total Protein 6.5 - 8.1 g/dL 7.9  Total Bilirubin 0.3 - 1.2 mg/dL 1.4(H)  Alkaline Phos 38 - 126 U/L 55  AST 15 - 41 U/L 32  ALT 0 - 44  U/L 15   CBC Latest Ref Rng & Units 01/01/2021  WBC 4.0 - 10.5 K/uL 5.7  Hemoglobin 13.0 - 17.0 g/dL 17.0  Hematocrit 39.0 - 52.0 % 52.5(H)  Platelets 150 - 400 K/uL 520(H)      Assessment and plan- Patient is a 87 y.o. male with near obstructing rectal mass that was positive for adenocarcinoma  At the time of my visit with the patient and his son, I was under the impression that patient has rectal cancer given that he had a near obstructing rectal mass which was also seen on his recent CT scan and biopsy showed adenocarcinoma.  Therefore my initial plan was for him to get an MRI pelvis as well as staging scans and refer him to [facility] surgery if a palliative diverting colostomy was needed.  Based on the scans if he had rectal cancer I was planning to offer neoadjuvant chemoradiation plus minus chemotherapy followed by surgery.  However the next day patient's pathology showed that this was prostatic adenocarcinoma .  Tumor was positive for PSA and PSAP and negative for CK7 CK20 and CDX2.  This will completely change his management and I will plan to see him next week to discuss further management of prostate cancer and also offer him the full dose of Firmagon.  He will need PSA and testosterone levels and CBC with differential and CMP to be checked at that time as well.  Given his age if he has locally advanced prostate cancer he would likely benefit from combination of ADT plus radiation.  He would likely not be a candidate for extensive surgery.  Also remains to be seen if he has metastatic disease and I would like to get a pylarify PET scan to complete his staging work-up.  Thank you for this kind referral and the opportunity to participate in the care of this patient   Visit Diagnosis 1. Prostate cancer [facility])     Dr. Randa Evens, MD, MPH [facility] at [facility] [phone] 01/14/2021

## 2021-01-15 ENCOUNTER — Ambulatory Visit: Payer: Medicare Other

## 2021-01-15 NOTE — Telephone Encounter (Signed)
Dr. Rao patient.  

## 2021-01-18 ENCOUNTER — Inpatient Hospital Stay: Payer: Medicare Other

## 2021-01-18 ENCOUNTER — Ambulatory Visit
Admission: RE | Admit: 2021-01-18 | Discharge: 2021-01-18 | Disposition: A | Discharge status: Home / Self Care | Payer: Medicare Other | Source: Ambulatory Visit | Attending: Radiation Oncology | Admitting: Radiation Oncology

## 2021-01-18 ENCOUNTER — Encounter: Payer: Self-pay | Admitting: Oncology

## 2021-01-18 ENCOUNTER — Inpatient Hospital Stay: Payer: Medicare Other | Attending: Oncology | Admitting: Oncology

## 2021-01-18 VITALS — BP 122/81 | HR 72 | Temp 95.6°F | Resp 18 | Wt 133.4 lb

## 2021-01-18 DIAGNOSIS — C61 Malignant neoplasm of prostate: Secondary | ICD-10-CM

## 2021-01-18 DIAGNOSIS — N400 Enlarged prostate without lower urinary tract symptoms: Secondary | ICD-10-CM | POA: Insufficient documentation

## 2021-01-18 DIAGNOSIS — N182 Chronic kidney disease, stage 2 (mild): Secondary | ICD-10-CM | POA: Insufficient documentation

## 2021-01-18 DIAGNOSIS — J449 Chronic obstructive pulmonary disease, unspecified: Secondary | ICD-10-CM | POA: Insufficient documentation

## 2021-01-18 DIAGNOSIS — Z803 Family history of malignant neoplasm of breast: Secondary | ICD-10-CM | POA: Insufficient documentation

## 2021-01-18 DIAGNOSIS — I129 Hypertensive chronic kidney disease with stage 1 through stage 4 chronic kidney disease, or unspecified chronic kidney disease: Secondary | ICD-10-CM | POA: Insufficient documentation

## 2021-01-18 DIAGNOSIS — F17210 Nicotine dependence, cigarettes, uncomplicated: Secondary | ICD-10-CM | POA: Insufficient documentation

## 2021-01-18 DIAGNOSIS — Z7189 Other specified counseling: Secondary | ICD-10-CM | POA: Insufficient documentation

## 2021-01-18 DIAGNOSIS — Z8041 Family history of malignant neoplasm of ovary: Secondary | ICD-10-CM | POA: Insufficient documentation

## 2021-01-18 DIAGNOSIS — K5900 Constipation, unspecified: Secondary | ICD-10-CM | POA: Diagnosis not present

## 2021-01-18 DIAGNOSIS — Z8042 Family history of malignant neoplasm of prostate: Secondary | ICD-10-CM | POA: Diagnosis not present

## 2021-01-18 DIAGNOSIS — R159 Full incontinence of feces: Secondary | ICD-10-CM | POA: Insufficient documentation

## 2021-01-18 DIAGNOSIS — I10 Essential (primary) hypertension: Secondary | ICD-10-CM | POA: Insufficient documentation

## 2021-01-18 DIAGNOSIS — Z79899 Other long term (current) drug therapy: Secondary | ICD-10-CM | POA: Diagnosis not present

## 2021-01-18 LAB — PSA: Prostatic Specific Antigen: 549 ng/mL — ABNORMAL HIGH (ref 0.00–4.00)

## 2021-01-18 MED ORDER — DEGARELIX ACETATE(240 MG DOSE) 120 MG/VIAL ~~LOC~~ SOLR
240.0000 mg | Freq: Once | SUBCUTANEOUS | Status: AC
  Administered 2021-01-18: 240 mg via SUBCUTANEOUS
  Filled 2021-01-18: qty 6

## 2021-01-18 NOTE — Consult Note (Signed)
NEW PATIENT EVALUATION  Name: Jacob [name]  MRN: [id number]  Date:   01/18/2021     DOB: Apr 25, 1934   This 87 y.o. male patient presents to the clinic for initial evaluation of locally poorly differentiated adenocarcinoma the prostate with rectal obstruction  REFERRING PHYSICIAN: [name], MD  CHIEF COMPLAINT:  Chief Complaint  Patient presents with  . Prostate Cancer    DIAGNOSIS: The encounter diagnosis was Prostate cancer ([place]).   PREVIOUS INVESTIGATIONS:  CT scans reviewed PET CT scan ordered Pathology report reviewed Clinical notes reviewed  HPI: Patient is a 87 year old male who presented with significant constipation and had a CT scan back in October showing diffuse thickening and hyperemia of the distal rectal concerning for potential tumor. He had a colonoscopy on January 26 showing near obstructing mass in the rectum 1 cm from the anal verge. Biopsy was positive for adenocarcinoma. Tumor was invasive poorly differentiated carcinoma with immunohistochemical stains positive for PSA and PSAP consistent with a prostate primary. He has been seen by medical oncology and has been started on ADT therapy. He is also scheduled to see Dr. Erlene Quan. He has been started on Firmagon. Patient comorbidities include chronic bronchitis stage II CKD. He is accompanied by his son although has poor understanding of his diagnosis. He states he is having a bowel movement although does have rectal incontinence at times and some pain in his pelvis.  PLANNED TREATMENT REGIMEN: Palliative radiation therapy  PAST MEDICAL HISTORY:  has a past medical history of BPH (benign prostatic hyperplasia), Chronic constipation (06/25/2015), COPD (chronic obstructive pulmonary disease) ([place]), Hypertension, Swelling of lower extremity, and Wears dentures.    PAST SURGICAL HISTORY:  Past Surgical History:  Procedure Laterality Date  . CATARACT EXTRACTION W/PHACO Right 09/30/2015   Procedure: CATARACT EXTRACTION  PHACO AND INTRAOCULAR LENS PLACEMENT ([place]);  Surgeon: Leandrew Koyanagi, MD;  Location: [place];  Service: Ophthalmology;  Laterality: Right;  . COLONOSCOPY  2009?  . COLONOSCOPY WITH PROPOFOL N/A 01/06/2021   Procedure: COLONOSCOPY WITH PROPOFOL;  Surgeon: Lin Landsman, MD;  [facility] ENDOSCOPY;  Service: Gastroenterology;  Laterality: N/A;  . HERNIA REPAIR Right    x2     FAMILY HISTORY: family history includes Alzheimer's disease in his mother; Breast cancer in his mother; Prostate cancer in his brother; Stroke in his father.  SOCIAL HISTORY:  reports that he has been smoking cigarettes. He has a 11.25 pack-year smoking history. He has never used smokeless tobacco. He reports current alcohol use. He reports that he does not use drugs.  ALLERGIES: Patient has no known allergies.  MEDICATIONS:  Current Outpatient Medications  Medication Sig Dispense Refill  . acetaminophen (TYLENOL) 500 MG tablet Take 500 mg by mouth every 4 (four) hours as needed.     Marland Kitchen amitriptyline (ELAVIL) 10 MG tablet 10 mg at bedtime.    Marland Kitchen amLODipine (NORVASC) 10 MG tablet Take 10 mg by mouth daily. PM    . aspirin 81 MG tablet Take 81 mg by mouth daily. PM    . cetirizine (ZYRTEC) 10 MG tablet Take by mouth.    . cyanocobalamin 500 MCG tablet Take 500 mcg by mouth daily. PM    . cyclobenzaprine (FLEXERIL) 5 MG tablet Take by mouth.    . finasteride (PROSCAR) 5 MG tablet TAKE 1 TABLET BY MOUTH EVERY DAY 90 tablet 3  . hydrochlorothiazide (HYDRODIURIL) 25 MG tablet Take 25 mg by mouth daily. PM    . metoprolol succinate (TOPROL-XL) 50 MG  24 hr tablet Take 50 mg by mouth daily.    . nortriptyline (PAMELOR) 10 MG capsule Take 10 mg by mouth at bedtime.    . polyethylene glycol powder (GLYCOLAX/MIRALAX) powder TAKE 17G ONCE DAILY AS NEEDED FOR CONSTIPATION (ADJUST AMOUNT AS NEEDED) MIX IN 4-8 OZ OF FLUID    . Propylene Glycol (SYSTANE BALANCE) 0.6 % SOLN INSTILL 3 5 TIMES A DAY IN EACH EYE AS  NEEDED FOR DRYNESS.    . tamsulosin (FLOMAX) 0.4 MG CAPS capsule TAKE 1 CAPSULE BY MOUTH EVERY DAY 90 capsule 3   No current facility-administered medications for this encounter.    ECOG PERFORMANCE STATUS:  1 - Symptomatic but completely ambulatory  REVIEW OF SYSTEMS: Bowel pattern as described above. Patient denies any weight loss, fatigue, weakness, fever, chills or night sweats. Patient denies any loss of vision, blurred vision. Patient denies any ringing  of the ears or hearing loss. No irregular heartbeat. Patient denies heart murmur or history of fainting. Patient denies any chest pain or pain radiating to her upper extremities. Patient denies any shortness of breath, difficulty breathing at night, cough or hemoptysis. Patient denies any swelling in the lower legs. Patient denies any nausea vomiting, vomiting of blood, or coffee ground material in the vomitus. Patient denies any stomach pain. Patient states has had normal bowel movements no significant constipation or diarrhea. Patient denies any dysuria, hematuria or significant nocturia. Patient denies any problems walking, swelling in the joints or loss of balance. Patient denies any skin changes, loss of hair or loss of weight. Patient denies any excessive worrying or anxiety or significant depression. Patient denies any problems with insomnia. Patient denies excessive thirst, polyuria, polydipsia. Patient denies any swollen glands, patient denies easy bruising or easy bleeding. Patient denies any recent infections, allergies or URI. Patient "s visual fields have not changed significantly in recent time.   PHYSICAL EXAM: There were no vitals taken for this visit. Frail-appearing elderly gentleman in NAD. Well-developed well-nourished patient in NAD. HEENT reveals PERLA, EOMI, discs not visualized.  Oral cavity is clear. No oral mucosal lesions are identified. Neck is clear without evidence of cervical or supraclavicular adenopathy. Lungs are  clear to A&P. Cardiac examination is essentially unremarkable with regular rate and rhythm without murmur rub or thrill. Abdomen is benign with no organomegaly or masses noted. Motor sensory and DTR levels are equal and symmetric in the upper and lower extremities. Cranial nerves II through XII are grossly intact. Proprioception is intact. No peripheral adenopathy or edema is identified. No motor or sensory levels are noted. Crude visual fields are within normal range.  LABORATORY DATA: Pathology report reviewed    RADIOLOGY RESULTS: CT scans reviewed PET scan attempting to be ordered   IMPRESSION: Locally advanced adenocarcinoma the prostate with rectal obstruction in 87 year old male  PLAN: This time like to start palliative radiation therapy. I would plan on delivering 60 Gray over 5 weeks to a large field incorporating his prostate and areas on CT scan of bowel obstruction. I would then reevaluate for possible small field boost. Do not feel I need to have PET scan prior to treating this large area of tumor involvement although would like to review this as soon as it becomes available. I have set up and ordered CT simulation for later this week hopefully will get started the middle of next week. Patient is already started [place] therapy. Risks and benefits of treatment including possible diarrhea increased lower urinary tract symptoms fatigue alteration of blood  count skin reaction all were reviewed with the patient and his son they both seem to comprehend my treatment plan well. Simulation appointment was made.  I would like to take this opportunity to thank you for allowing me to participate in the care of your patient.Noreene Filbert, MD

## 2021-01-18 NOTE — Progress Notes (Signed)
Hematology/Oncology Consult note [facility]  Telephone:([phone] Fax:([phone]) [phone]  Patient Care Team: [name], MD as PCP - General (Internal Medicine) Clent Jacks, RN as Oncology Nurse Navigator   Name of the patient: Jacob [name]  [id number]  02/11/1934   Date of visit: 01/18/21  Diagnosis-at least locally advanced prostate cancer  Chief complaint/ Reason for visit-discussed biopsy results and further management  Heme/Onc history: patient is a 87 year old male with a past medical history significant for BPH, Chronic bronchitis and stage II CKD who underwent a CT angio abdomen and pelvis with contrast on 10/08/2020 at that time he had diffuse thickening and hyperemia of the distal rectum concerning for potential tumor.  Hyperemia may also suggest infection/proctitis.  This was followed by a colonoscopy on 01/06/2021 which showed a near obstructing mass in the rectum 1 cm from the anal verge.  Scope could not be traversed beyond the mass.  Biopsy was taken and was positive for adenocarcinoma.  Patient is here with his son today and reports that he can still move his bowels but sometimes he has constipation and sometimes diarrhea.  He does not have a good insight into his medical problems.  Denies any significant pain but does report occasional rectal discomfort. He is independent of his ADLs  Rectal biopsy showed adenocarcinoma poorly differentiated.  Tumor was positive for PSA and PSAP but negative for CK7, CK20 and CDX2.  Findings are consistent with adenocarcinoma from prostate origin.  Interval history-patient reports that he is moving his bowels well although sometimes he has some difficulty.  Denies any abdominal pain.  Has baseline fatigue  ECOG PS- 1 Pain scale- 0   Review of systems- Review of Systems  Constitutional: Positive for malaise/fatigue. Negative for chills, fever and weight loss.  HENT: Negative for congestion, ear discharge  and nosebleeds.   Eyes: Negative for blurred vision.  Respiratory: Negative for cough, hemoptysis, sputum production, shortness of breath and wheezing.   Cardiovascular: Negative for chest pain, palpitations, orthopnea and claudication.  Gastrointestinal: Negative for abdominal pain, blood in stool, constipation, diarrhea, heartburn, melena, nausea and vomiting.  Genitourinary: Negative for dysuria, flank pain, frequency, hematuria and urgency.  Musculoskeletal: Negative for back pain, joint pain and myalgias.  Skin: Negative for rash.  Neurological: Negative for dizziness, tingling, focal weakness, seizures, weakness and headaches.  Endo/Heme/Allergies: Does not bruise/bleed easily.  Psychiatric/Behavioral: Negative for depression and suicidal ideas. The patient does not have insomnia.       No Known Allergies   Past Medical History:  Diagnosis Date  . BPH (benign prostatic hyperplasia)   . Chronic constipation 06/25/2015  . COPD (chronic obstructive pulmonary disease) ([place])    pt denies.  . Hypertension   . Swelling of lower extremity    bilateral  . Wears dentures    full upper and lower - do not fit well.     Past Surgical History:  Procedure Laterality Date  . CATARACT EXTRACTION W/PHACO Right 09/30/2015   Procedure: CATARACT EXTRACTION PHACO AND INTRAOCULAR LENS PLACEMENT ([place]);  Surgeon: Leandrew Koyanagi, MD;  Location: [facility];  Service: Ophthalmology;  Laterality: Right;  . COLONOSCOPY  2009?  . COLONOSCOPY WITH PROPOFOL N/A 01/06/2021   Procedure: COLONOSCOPY WITH PROPOFOL;  Surgeon: Lin Landsman, MD;  Location: [facility] ENDOSCOPY;  Service: Gastroenterology;  Laterality: N/A;  . HERNIA REPAIR Right    x2     Social History   Socioeconomic History  . Marital status: Married  Spouse name: Not on file  . Number of children: Not on file  . Years of education: Not on file  . Highest education level: Not on file  Occupational History  . Not on  file  Tobacco Use  . Smoking status: Current Every Day Smoker    Packs/day: 0.25    Years: 45.00    Pack years: 11.25    Types: Cigarettes  . Smokeless tobacco: Never Used  Vaping Use  . Vaping Use: Never used  Substance and Sexual Activity  . Alcohol use: Yes    Comment: drink beer maybe 2 - 3 weeks  . Drug use: No  . Sexual activity: Not on file  Other Topics Concern  . Not on file  Social History Narrative  . Not on file   Social Determinants of Health   Financial Resource Strain: Not on file  Food Insecurity: Not on file  Transportation Needs: Not on file  Physical Activity: Not on file  Stress: Not on file  Social Connections: Not on file  Intimate Partner Violence: Not on file    Family History  Problem Relation Age of Onset  . Prostate cancer Brother   . Alzheimer's disease Mother   . Breast cancer Mother   . Stroke Father   . Kidney cancer Neg Hx   . Kidney disease Neg Hx   . Bladder Cancer Neg Hx      Current Outpatient Medications:  .  acetaminophen (TYLENOL) 500 MG tablet, Take 500 mg by mouth every 4 (four) hours as needed. , Disp: , Rfl:  .  amitriptyline (ELAVIL) 10 MG tablet, 10 mg at bedtime., Disp: , Rfl:  .  amLODipine (NORVASC) 10 MG tablet, Take 10 mg by mouth daily. PM, Disp: , Rfl:  .  aspirin 81 MG tablet, Take 81 mg by mouth daily. PM, Disp: , Rfl:  .  cetirizine (ZYRTEC) 10 MG tablet, Take by mouth., Disp: , Rfl:  .  cyanocobalamin 500 MCG tablet, Take 500 mcg by mouth daily. PM, Disp: , Rfl:  .  cyclobenzaprine (FLEXERIL) 5 MG tablet, Take by mouth., Disp: , Rfl:  .  finasteride (PROSCAR) 5 MG tablet, TAKE 1 TABLET BY MOUTH EVERY DAY, Disp: 90 tablet, Rfl: 3 .  hydrochlorothiazide (HYDRODIURIL) 25 MG tablet, Take 25 mg by mouth daily. PM, Disp: , Rfl:  .  metoprolol succinate (TOPROL-XL) 50 MG 24 hr tablet, Take 50 mg by mouth daily., Disp: , Rfl:  .  nortriptyline (PAMELOR) 10 MG capsule, Take 10 mg by mouth at bedtime., Disp: , Rfl:   .  polyethylene glycol powder (GLYCOLAX/MIRALAX) powder, TAKE 17G ONCE DAILY AS NEEDED FOR CONSTIPATION (ADJUST AMOUNT AS NEEDED) MIX IN 4-8 OZ OF FLUID, Disp: , Rfl:  .  Propylene Glycol (SYSTANE BALANCE) 0.6 % SOLN, INSTILL 3 5 TIMES A DAY IN EACH EYE AS NEEDED FOR DRYNESS., Disp: , Rfl:  .  tamsulosin (FLOMAX) 0.4 MG CAPS capsule, TAKE 1 CAPSULE BY MOUTH EVERY DAY, Disp: 90 capsule, Rfl: 3  Physical exam:  Vitals:   01/18/21 1030  BP: 122/81  Pulse: 72  Resp: 18  Temp: (!) 95.6 F (35.3 C)  TempSrc: Tympanic  SpO2: 96%  Weight: 133 lb 6.4 oz (60.5 kg)   Physical Exam Eyes:     Extraocular Movements: EOM normal.  Cardiovascular:     Rate and Rhythm: Normal rate and regular rhythm.  Pulmonary:     Effort: Pulmonary effort is normal.  Abdominal:  General: Bowel sounds are normal.     Palpations: Abdomen is soft.  Skin:    General: Skin is warm and dry.  Neurological:     Mental Status: He is alert and oriented to person, place, and time.      CMP Latest Ref Rng & Units 01/01/2021  Glucose 70 - 99 mg/dL 108(H)  BUN 8 - 23 mg/dL 27(H)  Creatinine 0.61 - 1.24 mg/dL 1.26(H)  Sodium 135 - 145 mmol/L 140  Potassium 3.5 - 5.1 mmol/L 3.5  Chloride 98 - 111 mmol/L 97(L)  CO2 22 - 32 mmol/L 28  Calcium 8.9 - 10.3 mg/dL 9.6  Total Protein 6.5 - 8.1 g/dL 7.9  Total Bilirubin 0.3 - 1.2 mg/dL 1.4(H)  Alkaline Phos 38 - 126 U/L 55  AST 15 - 41 U/L 32  ALT 0 - 44 U/L 15   CBC Latest Ref Rng & Units 01/01/2021  WBC 4.0 - 10.5 K/uL 5.7  Hemoglobin 13.0 - 17.0 g/dL 17.0  Hematocrit 39.0 - 52.0 % 52.5(H)  Platelets 150 - 400 K/uL 520(H)      Assessment and plan- Patient is a 87 y.o. male with at least locally advanced prostate cancer here to discuss further management  Discussed with the patient and his son that the rectal mass biopsy actually shows prostate cancer not rectal cancer which is managed differently. Patient has a T4 lesion given the rectal involvement and at  least has locally advanced stage III prostate cancer. We are currently awaiting pylarify scan results to see if he has any evidence of metastatic disease.  We will check his testosterone and PSA levels today and proceed with first dose of Firmagon. Patient has a near obstructing rectal lesion and my concern at this time is to prevent appropriate bowel obstruction. Mills Koller will hopefully help shrink the tumor while we are awaiting his metastatic work-up. He is also seeing Dr. Baruch Gouty today to discuss radiation therapy to his rectum and prostate area. Given the extent of rectal involvement as well as patient's age he would not be a candidate for surgery and I did get in touch with urology Dr. Erlene Quan about him as well.  I will tentatively see the patient back in 1 month's time with CBC with differential, CMP, PSA for second dose of Firmagon and following that I will switch him to every 61-month Lupron.    Visit Diagnosis 1. Prostate cancer ([place])   2. Goals of care, counseling/discussion      Dr. Randa Evens, MD, MPH [facility] [id number] 01/18/2021 11:20 AM

## 2021-01-18 NOTE — Addendum Note (Signed)
Addended by: Kern Alberta on: 01/18/2021 01:13 PM   Modules accepted: Orders

## 2021-01-19 ENCOUNTER — Telehealth: Payer: Self-pay | Admitting: Oncology

## 2021-01-19 LAB — TESTOSTERONE: Testosterone: 679 ng/dL (ref 264–916)

## 2021-01-19 NOTE — Telephone Encounter (Signed)
Called pt's son (not able to leave a message on pt or spouse's phone) left vm notifying him of upcoming scans and to please return phone call to confirm.

## 2021-01-19 NOTE — Telephone Encounter (Signed)
Pt's son [name], left vm requesting a call back [phone].

## 2021-01-19 NOTE — Telephone Encounter (Signed)
Thank you--just returned call.

## 2021-01-19 NOTE — Telephone Encounter (Signed)
Spoke with pt's son [name]) to review and confirm all upcoming scans and appts.

## 2021-01-20 ENCOUNTER — Ambulatory Visit
Admission: RE | Admit: 2021-01-20 | Discharge: 2021-01-20 | Disposition: A | Discharge status: Home / Self Care | Payer: Medicare Other | Source: Ambulatory Visit | Attending: Radiation Oncology | Admitting: Radiation Oncology

## 2021-01-20 DIAGNOSIS — C61 Malignant neoplasm of prostate: Secondary | ICD-10-CM | POA: Diagnosis present

## 2021-01-20 DIAGNOSIS — Z510 Encounter for antineoplastic radiation therapy: Secondary | ICD-10-CM | POA: Diagnosis present

## 2021-01-21 ENCOUNTER — Ambulatory Visit
Admission: RE | Admit: 2021-01-21 | Discharge: 2021-01-21 | Disposition: A | Discharge status: Home / Self Care | Payer: Medicare Other | Source: Ambulatory Visit | Attending: Oncology | Admitting: Oncology

## 2021-01-21 ENCOUNTER — Other Ambulatory Visit: Payer: Self-pay

## 2021-01-21 DIAGNOSIS — C61 Malignant neoplasm of prostate: Secondary | ICD-10-CM | POA: Diagnosis present

## 2021-01-21 HISTORY — DX: Malignant (primary) neoplasm, unspecified: C80.1

## 2021-01-21 MED ORDER — IOHEXOL 300 MG/ML  SOLN
75.0000 mL | Freq: Once | INTRAMUSCULAR | Status: AC | PRN
  Administered 2021-01-21: 75 mL via INTRAVENOUS

## 2021-01-25 ENCOUNTER — Encounter
Admission: RE | Admit: 2021-01-25 | Discharge: 2021-01-25 | Disposition: A | Discharge status: Home / Self Care | Payer: Medicare Other | Source: Ambulatory Visit | Attending: Oncology | Admitting: Oncology

## 2021-01-25 ENCOUNTER — Other Ambulatory Visit: Payer: Medicare Other

## 2021-01-25 ENCOUNTER — Other Ambulatory Visit: Payer: Self-pay

## 2021-01-25 DIAGNOSIS — C61 Malignant neoplasm of prostate: Secondary | ICD-10-CM | POA: Insufficient documentation

## 2021-01-25 DIAGNOSIS — Z510 Encounter for antineoplastic radiation therapy: Secondary | ICD-10-CM | POA: Diagnosis not present

## 2021-01-25 MED ORDER — TECHNETIUM TC 99M MEDRONATE IV KIT
20.0000 | PACK | Freq: Once | INTRAVENOUS | Status: AC | PRN
  Administered 2021-01-25: 21.695 via INTRAVENOUS

## 2021-01-27 ENCOUNTER — Ambulatory Visit
Admission: RE | Admit: 2021-01-27 | Discharge: 2021-01-27 | Disposition: A | Discharge status: Home / Self Care | Payer: Medicare Other | Source: Ambulatory Visit | Attending: Oncology | Admitting: Oncology

## 2021-01-27 ENCOUNTER — Inpatient Hospital Stay (HOSPITAL_BASED_OUTPATIENT_CLINIC_OR_DEPARTMENT_OTHER): Payer: Medicare Other | Admitting: Hospice and Palliative Medicine

## 2021-01-27 ENCOUNTER — Other Ambulatory Visit: Payer: Self-pay

## 2021-01-27 ENCOUNTER — Ambulatory Visit: Admission: RE | Admit: 2021-01-27 | Payer: Medicare Other | Source: Ambulatory Visit

## 2021-01-27 DIAGNOSIS — Z510 Encounter for antineoplastic radiation therapy: Secondary | ICD-10-CM | POA: Diagnosis not present

## 2021-01-27 DIAGNOSIS — C61 Malignant neoplasm of prostate: Secondary | ICD-10-CM | POA: Insufficient documentation

## 2021-01-27 NOTE — Progress Notes (Signed)
Multidisciplinary Oncology Council Documentation  Jacob [name] was presented by our [facility] on 01/27/2021, which included representatives from:  . Palliative Care . Dietitian  . Physical/Occupational Therapist . Nurse Navigator . Genetics . Speech Therapist . Social work . Survivorship RN . Hotel manager . Research RN . [place] currently presents with history of prostate CA  We reviewed previous medical and familial history, history of present illness, and recent lab results along with all available histopathologic and imaging studies. The [place] considered available treatment options and made the following recommendations/referrals:  Recommend genetics and nutrition consults. Consider also rehab screening with Jacob [name].   The MOC is a meeting of clinicians from various specialty areas who evaluate and discuss patients for whom a multidisciplinary approach is being considered. Final determinations in the plan of care are those of the provider(s).   Today's extended care, comprehensive team conference, Jacob [name] was not present for the discussion and was not examined.

## 2021-01-28 ENCOUNTER — Ambulatory Visit
Admission: RE | Admit: 2021-01-28 | Discharge: 2021-01-28 | Disposition: A | Discharge status: Home / Self Care | Payer: Medicare Other | Source: Ambulatory Visit | Attending: Radiation Oncology | Admitting: Radiation Oncology

## 2021-01-28 ENCOUNTER — Telehealth: Payer: Self-pay | Admitting: *Deleted

## 2021-01-28 DIAGNOSIS — Z510 Encounter for antineoplastic radiation therapy: Secondary | ICD-10-CM | POA: Diagnosis not present

## 2021-01-28 NOTE — Telephone Encounter (Signed)
Called report of MR,    IMPRESSION: Rectal adenocarcinoma T stage: T4 B suspected, edema confound exam but there are highly suspicious areas that are inseparable from the prostate along the rectoprostatic fascia.  Rectal adenocarcinoma N stage: Not assessed due to diffuse edema and loss of normal tissue planes in the pelvis.  Suspected tumor extends to the level of the internal sphincter no motion does limit assessment. There is thickening in general of the sphincter complex which may be involved diffusely by tumor along its upper margin.  Suggest additional imaging of the patient in small field-of-view with imaging along the long axis of the tumor without rectal gel to include contrasted imaging in this same plane for assessment of local extent of disease.  Also for nodal staging purposes in the pelvis PET scan may be helpful given the diffuse nature of tumor and the lack of defined soft tissue planes.   Electronically Signed   By: Zetta Bills M.D.   On: 01/28/2021 11:46

## 2021-01-29 ENCOUNTER — Ambulatory Visit
Admission: RE | Admit: 2021-01-29 | Discharge: 2021-01-29 | Disposition: A | Discharge status: Home / Self Care | Payer: Medicare Other | Source: Ambulatory Visit | Attending: Radiation Oncology | Admitting: Radiation Oncology

## 2021-01-29 DIAGNOSIS — Z510 Encounter for antineoplastic radiation therapy: Secondary | ICD-10-CM | POA: Diagnosis not present

## 2021-02-01 ENCOUNTER — Ambulatory Visit
Admission: RE | Admit: 2021-02-01 | Discharge: 2021-02-01 | Disposition: A | Discharge status: Home / Self Care | Payer: Medicare Other | Source: Ambulatory Visit | Attending: Radiation Oncology | Admitting: Radiation Oncology

## 2021-02-01 DIAGNOSIS — Z510 Encounter for antineoplastic radiation therapy: Secondary | ICD-10-CM | POA: Diagnosis not present

## 2021-02-02 ENCOUNTER — Ambulatory Visit
Admission: RE | Admit: 2021-02-02 | Discharge: 2021-02-02 | Disposition: A | Discharge status: Home / Self Care | Payer: Medicare Other | Source: Ambulatory Visit | Attending: Radiation Oncology | Admitting: Radiation Oncology

## 2021-02-02 DIAGNOSIS — Z510 Encounter for antineoplastic radiation therapy: Secondary | ICD-10-CM | POA: Diagnosis not present

## 2021-02-03 ENCOUNTER — Ambulatory Visit
Admission: RE | Admit: 2021-02-03 | Discharge: 2021-02-03 | Disposition: A | Discharge status: Home / Self Care | Payer: Medicare Other | Source: Ambulatory Visit | Attending: Radiation Oncology | Admitting: Radiation Oncology

## 2021-02-03 DIAGNOSIS — Z510 Encounter for antineoplastic radiation therapy: Secondary | ICD-10-CM | POA: Diagnosis not present

## 2021-02-04 ENCOUNTER — Ambulatory Visit
Admission: RE | Admit: 2021-02-04 | Discharge: 2021-02-04 | Disposition: A | Discharge status: Home / Self Care | Payer: Medicare Other | Source: Ambulatory Visit | Attending: Radiation Oncology | Admitting: Radiation Oncology

## 2021-02-04 DIAGNOSIS — Z510 Encounter for antineoplastic radiation therapy: Secondary | ICD-10-CM | POA: Diagnosis not present

## 2021-02-05 ENCOUNTER — Ambulatory Visit
Admission: RE | Admit: 2021-02-05 | Discharge: 2021-02-05 | Disposition: A | Discharge status: Home / Self Care | Payer: Medicare Other | Source: Ambulatory Visit | Attending: Radiation Oncology | Admitting: Radiation Oncology

## 2021-02-05 DIAGNOSIS — Z510 Encounter for antineoplastic radiation therapy: Secondary | ICD-10-CM | POA: Diagnosis not present

## 2021-02-08 ENCOUNTER — Ambulatory Visit
Admission: RE | Admit: 2021-02-08 | Discharge: 2021-02-08 | Disposition: A | Discharge status: Home / Self Care | Payer: Medicare Other | Source: Ambulatory Visit | Attending: Radiation Oncology | Admitting: Radiation Oncology

## 2021-02-08 DIAGNOSIS — Z510 Encounter for antineoplastic radiation therapy: Secondary | ICD-10-CM | POA: Diagnosis not present

## 2021-02-09 ENCOUNTER — Ambulatory Visit
Admission: RE | Admit: 2021-02-09 | Discharge: 2021-02-09 | Disposition: A | Discharge status: Home / Self Care | Payer: Medicare Other | Source: Ambulatory Visit | Attending: Radiation Oncology | Admitting: Radiation Oncology

## 2021-02-09 DIAGNOSIS — Z510 Encounter for antineoplastic radiation therapy: Secondary | ICD-10-CM | POA: Diagnosis present

## 2021-02-09 DIAGNOSIS — C61 Malignant neoplasm of prostate: Secondary | ICD-10-CM | POA: Insufficient documentation

## 2021-02-10 ENCOUNTER — Inpatient Hospital Stay: Payer: Medicare Other | Admitting: Licensed Clinical Social Worker

## 2021-02-10 ENCOUNTER — Inpatient Hospital Stay: Payer: Medicare Other

## 2021-02-10 ENCOUNTER — Ambulatory Visit
Admission: RE | Admit: 2021-02-10 | Discharge: 2021-02-10 | Disposition: A | Discharge status: Home / Self Care | Payer: Medicare Other | Source: Ambulatory Visit | Attending: Radiation Oncology | Admitting: Radiation Oncology

## 2021-02-10 ENCOUNTER — Inpatient Hospital Stay: Payer: Medicare Other | Attending: Oncology | Admitting: Occupational Therapy

## 2021-02-10 DIAGNOSIS — N400 Enlarged prostate without lower urinary tract symptoms: Secondary | ICD-10-CM | POA: Insufficient documentation

## 2021-02-10 DIAGNOSIS — N182 Chronic kidney disease, stage 2 (mild): Secondary | ICD-10-CM | POA: Insufficient documentation

## 2021-02-10 DIAGNOSIS — Z803 Family history of malignant neoplasm of breast: Secondary | ICD-10-CM

## 2021-02-10 DIAGNOSIS — F17210 Nicotine dependence, cigarettes, uncomplicated: Secondary | ICD-10-CM | POA: Insufficient documentation

## 2021-02-10 DIAGNOSIS — C61 Malignant neoplasm of prostate: Secondary | ICD-10-CM | POA: Insufficient documentation

## 2021-02-10 DIAGNOSIS — I129 Hypertensive chronic kidney disease with stage 1 through stage 4 chronic kidney disease, or unspecified chronic kidney disease: Secondary | ICD-10-CM | POA: Insufficient documentation

## 2021-02-10 DIAGNOSIS — Z8042 Family history of malignant neoplasm of prostate: Secondary | ICD-10-CM | POA: Insufficient documentation

## 2021-02-10 DIAGNOSIS — Z510 Encounter for antineoplastic radiation therapy: Secondary | ICD-10-CM | POA: Diagnosis not present

## 2021-02-10 DIAGNOSIS — M6281 Muscle weakness (generalized): Secondary | ICD-10-CM

## 2021-02-10 NOTE — Therapy (Signed)
[facility] [address] [place], [place], [place], [address] Phone: [phone]   Fax:  [phone]  Occupational Therapy Evaluation  Patient Details  Name: ERCEL PEPITONE MRN: [id number] Date of Birth: 10/17/1934 No data recorded  Encounter Date: 02/10/2021   OT End of Session - 02/10/21 1552    Visit Number 0           Past Medical History:  Diagnosis Date  . BPH (benign prostatic hyperplasia)   . Cancer ([place])   . Chronic constipation 06/25/2015  . COPD (chronic obstructive pulmonary disease) ([place])    pt denies.  . Hypertension   . Swelling of lower extremity    bilateral  . Wears dentures    full upper and lower - do not fit well.    Past Surgical History:  Procedure Laterality Date  . CATARACT EXTRACTION W/PHACO Right 09/30/2015   Procedure: CATARACT EXTRACTION PHACO AND INTRAOCULAR LENS PLACEMENT ([place]);  Surgeon: Leandrew Koyanagi, MD;  Location: [facility];  Service: Ophthalmology;  Laterality: Right;  . COLONOSCOPY  2009?  . COLONOSCOPY WITH PROPOFOL N/A 01/06/2021   Procedure: COLONOSCOPY WITH PROPOFOL;  Surgeon: Lin Landsman, MD;  Location: [facility] ENDOSCOPY;  Service: Gastroenterology;  Laterality: N/A;  . HERNIA REPAIR Right    x2     There were no vitals filed for this visit.   Subjective Assessment - 02/10/21 1551    Subjective  I am tired and want to give up - cannot make food, leave the stove on - did not had any falls and cannot drive when it is dark - my wife in nursing home for about 6 yrs    Currently in Pain? No/denies             Dr. Randa Evens, MD, MPH NOTE ON 01/18/21:    Patient is a 87 y.o. male with at least locally advanced prostate cancer here to discuss further management  Discussed with the patient and his son that the rectal mass biopsy actually shows prostate cancer not rectal cancer which is managed differently. Patient has a T4 lesion given the rectal  involvement and at least has locally advanced stage III prostate cancer. We are currently awaiting pylarify scan results to see if he has any evidence of metastatic disease.  We will check his testosterone and PSA levels today and proceed with first dose of Firmagon. Patient has a near obstructing rectal lesion and my concern at this time is to prevent appropriate bowel obstruction. Mills Koller will hopefully help shrink the tumor while we are awaiting his metastatic work-up. He is also seeing Dr. Baruch Gouty today to discuss radiation therapy to his rectum and prostate area. Given the extent of rectal involvement as well as patient's age he would not be a candidate for surgery and I did get in touch with urology Dr. Erlene Quan about him as well.  I will tentatively see the patient back in 1 month's time with CBC with differential, CMP, PSA for second dose of Firmagon and following that I will switch him to every 67-month Lupron.  Pt started radiation - had his 9th session today     OT SCREEN 02/10/21:  Pt this date report coming into rehab session that he cannot make enough food for him and stays hungry and cannot drive at night time- because of vision. Also so tired and weak - just cannot do things anymore - like cleaning , cooking and doing dishes. Has 2  daughters but they do not help - Son lives in [place]- come in beginning with him to appointment -but he does not see him a lot. Pt drive himself to appointments. Pt denies falls ,and report had tub shower combo - but shower chair he had to give away - wife use to use it - she is for the last 6 yrs  in nursing home. Discuss with Sherri - Dr Elroy Channel nurse this date pt situation - he still have radiation appt until about 23rd of March - she contacted transportation to set up [facility] for pt. Also contacted [employer] -and they can start provide pt one warm meal a day early next week. Discuss with pt, to talk to son to maybe brings him on weekend some meals  for couple of days -and then stop at drive thru and get few things for the other days - and things that is easy to fix. Pt BERG balance test 42/50 - low risk for falling  But would recommend maybe referral to Palliative Care and Dietician                                       P        Visit Diagnosis: Muscle weakness (generalized)    Problem List Patient Active Problem List   Diagnosis Date Noted  . Goals of care, counseling/discussion 01/18/2021  . Prostate cancer ([place]) 01/14/2021  . Vitiligo 10/04/2019  . Vitamin D deficiency 12/28/2018  . Right inguinal hernia 06/21/2018  . Chronic kidney insufficiency, stage 2 (mild) 02/02/2018  . Chronic bronchitis, simple ([place]) 12/28/2016  . High risk medication use 12/24/2015  . Cigarette smoker 12/24/2015  . Benign essential hypertension 06/24/2014  . Benign prostatic hyperplasia 06/24/2014  . Back pain 06/24/2014  . B12 deficiency 06/24/2014    Rosalyn Gess OTR/L,CLT 02/10/2021, 3:53 PM  [facility] [address] [place], [place], [place], [address] Phone: [phone]   Fax:  [phone]  Name: QUAVION BOULE MRN: [id number] Date of Birth: December 19, 1933

## 2021-02-11 ENCOUNTER — Ambulatory Visit
Admission: RE | Admit: 2021-02-11 | Discharge: 2021-02-11 | Disposition: A | Discharge status: Home / Self Care | Payer: Medicare Other | Source: Ambulatory Visit | Attending: Radiation Oncology | Admitting: Radiation Oncology

## 2021-02-11 ENCOUNTER — Encounter: Payer: Self-pay | Admitting: Licensed Clinical Social Worker

## 2021-02-11 ENCOUNTER — Telehealth: Payer: Self-pay | Admitting: Internal Medicine

## 2021-02-11 DIAGNOSIS — Z8042 Family history of malignant neoplasm of prostate: Secondary | ICD-10-CM | POA: Insufficient documentation

## 2021-02-11 DIAGNOSIS — Z803 Family history of malignant neoplasm of breast: Secondary | ICD-10-CM | POA: Insufficient documentation

## 2021-02-11 DIAGNOSIS — Z510 Encounter for antineoplastic radiation therapy: Secondary | ICD-10-CM | POA: Diagnosis not present

## 2021-02-11 NOTE — Progress Notes (Signed)
REFERRING PROVIDER: Sindy Guadeloupe, MD [place],  [place] [address]  PRIMARY PROVIDER:  [name], MD  PRIMARY REASON FOR VISIT:  1. Prostate cancer ([place])   2. Family history of prostate cancer   3. Family history of breast cancer      HISTORY OF PRESENT ILLNESS:   Jacob [name], a 87 y.o. male, was seen for a [facility] cancer genetics consultation at the request of Dr. Janese Banks due to a personal and family history of cancer.  Jacob [name] presents to clinic today to discuss the possibility of a hereditary predisposition to cancer, genetic testing, and to further clarify his future cancer risks, as well as potential cancer risks for family members.   In 2022, at the age of 72, Jacob [name] was diagnosed with prostate cancer.   CANCER HISTORY:  Oncology History   No history exists.     Past Medical History:  Diagnosis Date  . BPH (benign prostatic hyperplasia)   . Cancer ([place])   . Chronic constipation 06/25/2015  . COPD (chronic obstructive pulmonary disease) ([place])    pt denies.  . Family history of breast cancer   . Family history of prostate cancer   . Hypertension   . Swelling of lower extremity    bilateral  . Wears dentures    full upper and lower - do not fit well.    Past Surgical History:  Procedure Laterality Date  . CATARACT EXTRACTION W/PHACO Right 09/30/2015   Procedure: CATARACT EXTRACTION PHACO AND INTRAOCULAR LENS PLACEMENT ([place]);  Surgeon: Leandrew Koyanagi, MD;  Location: [facility];  Service: Ophthalmology;  Laterality: Right;  . COLONOSCOPY  2009?  . COLONOSCOPY WITH PROPOFOL N/A 01/06/2021   Procedure: COLONOSCOPY WITH PROPOFOL;  Surgeon: Lin Landsman, MD;  Location: [facility] ENDOSCOPY;  Service: Gastroenterology;  Laterality: N/A;  . HERNIA REPAIR Right    x2     Social History   Socioeconomic History  . Marital status: Married    Spouse name: Not on file  . Number of children: Not on file  . Years of education: Not on file   . Highest education level: Not on file  Occupational History  . Not on file  Tobacco Use  . Smoking status: Current Every Day Smoker    Packs/day: 0.25    Years: 45.00    Pack years: 11.25    Types: Cigarettes  . Smokeless tobacco: Never Used  Vaping Use  . Vaping Use: Never used  Substance and Sexual Activity  . Alcohol use: Yes    Comment: drink beer maybe 2 - 3 weeks  . Drug use: No  . Sexual activity: Not on file  Other Topics Concern  . Not on file  Social History Narrative  . Not on file   Social Determinants of Health   Financial Resource Strain: Not on file  Food Insecurity: Not on file  Transportation Needs: Not on file  Physical Activity: Not on file  Stress: Not on file  Social Connections: Not on file     FAMILY HISTORY:  We obtained a detailed, 4-generation family history.  Significant diagnoses are listed below: Family History  Problem Relation Age of Onset  . Prostate cancer Brother   . Alzheimer's disease Mother   . Breast cancer Mother        dx 46 or 79  . Stroke Father   . Heart attack Daughter   . Kidney cancer Neg Hx   . Kidney disease  Neg Hx   . Bladder Cancer Neg Hx    Jacob [name] had 4 sons, 1 daughter. His daughter passed of a heart attack. Jacob [name] had 2 brothers, 2 sisters. One brother had prostate cancer that he passed from in his 59s.   Jacob [name] mother had breast cancer at 31/40, she passed at 77 of dementia. Patient has limited info about history but is unaware of any other cancers on this side of the family. He is unaware of any cancer on paternal side, again limited information.   Jacob [name] is unaware of previous family history of genetic testing for hereditary cancer risks. There is no reported Ashkenazi Jewish ancestry. There is no known consanguinity.    GENETIC COUNSELING ASSESSMENT: Jacob [name] is a 87 y.o. male with a personal and family history which is somewhat suggestive of a hereditary cancer syndrome and predisposition to  cancer. We, therefore, discussed and recommended the following at today's visit.   DISCUSSION: We discussed that approximately 5-10% of prostate cancer is hereditary  Most cases of hereditary prostate cancer are associated with BRCA1/BRCA2 genes, although there are other genes associated with hereditary  cancer as well including.  We discussed that testing is beneficial for several reasons including knowing if an individual is a candidate for certain targeted therapies, knowing about other cancer risks, identifying potential screening and risk-reduction options that may be appropriate, and to understand if other family members could be at risk for cancer and allow them to undergo genetic testing.   We reviewed the characteristics, features and inheritance patterns of hereditary cancer syndromes. We also discussed genetic testing, including the appropriate family members to test, the process of testing, insurance coverage and turn-around-time for results. We discussed the implications of a negative, positive and/or variant of uncertain significant result. We recommended Jacob [name] pursue genetic testing for the [facility] Common Hereditary Cancers gene panel.   The Common Hereditary Cancers Panel offered by [facility] includes sequencing and/or deletion duplication testing of the following 47 genes: APC, ATM, AXIN2, BARD1, BMPR1A, BRCA1, BRCA2, BRIP1, CDH1, CDKN2A (p14ARF), CDKN2A (p16INK4a), CKD4, CHEK2, CTNNA1, DICER1, EPCAM (Deletion/duplication testing only), GREM1 (promoter region deletion/duplication testing only), KIT, MEN1, MLH1, MSH2, MSH3, MSH6, MUTYH, NBN, NF1, NHTL1, PALB2, PDGFRA, PMS2, POLD1, POLE, PTEN, RAD50, RAD51C, RAD51D, SDHB, SDHC, SDHD, SMAD4, SMARCA4. STK11, TP53, TSC1, TSC2, and VHL.  The following genes were evaluated for sequence changes only: SDHA and HOXB13 c.251G>A variant only.  Based on Jacob [name] personal and family history of cancer, he meets medical criteria for genetic testing. He  will not have a cost due to the DETECT program.   PLAN: After considering the risks, benefits, and limitations, Jacob [name] provided informed consent to pursue genetic testing and the blood sample was sent to [facility] for analysis of the [facility]ECT Common Hereditary Cancers Panel. Results should be available within approximately 2-3 weeks' time, at which point they will be disclosed by telephone to Jacob [name], as will any additional recommendations warranted by these results. Jacob [name] will receive a summary of his genetic counseling visit and a copy of his results once available. This information will also be available in Epic.   Jacob [name] questions were answered to his satisfaction today. Our contact information was provided should additional questions or concerns arise. Thank you for the referral and allowing [place] to share in the care of your patient.   Faith Rogue, MS, [facility] Genetic Counselor [email or web address] Phone: [phone]  The patient was seen for a total of  15 minutes in face-to-face genetic counseling. [facility] intern Magda Paganini was also present.  Dr. Grayland Ormond was available for discussion regarding this case.   _______________________________________________________________________ For Office Staff:  Number of people involved in session: 2 Was an Intern/ student involved with case: yes

## 2021-02-11 NOTE — Telephone Encounter (Signed)
   Jacob [name] DOB: Sep 28, 1934 MRN: [id number]   RIDER WAIVER AND RELEASE OF LIABILITY  For purposes of improving physical access to our facilities, [facility] is pleased to partner with third parties to provide [facility] patients or other authorized individuals the option of convenient, on-demand ground transportation services (the Technical brewer") through use of the technology service that enables users to request on-demand ground transportation from independent third-party providers.  By opting to use and accept these [employer], I, the undersigned, hereby agree on behalf of myself, and on behalf of any minor child using the [employer] for whom I am the parent or legal guardian, as follows:  1. Government social research officer provided to me are provided by independent third-party transportation providers who are not [employer] or employees and who are unaffiliated with [employer]. 2. [place] is neither a transportation carrier nor a common or public carrier. 3. [facility] has no control over the quality or safety of the transportation that occurs as a result of the [employer]. 4. [place] cannot guarantee that any third-party transportation provider will complete any arranged transportation service. 5. [place] makes no representation, warranty, or guarantee regarding the reliability, timeliness, quality, safety, suitability, or availability of any of the Transport Services or that they will be error free. 6. I fully understand that traveling by vehicle involves risks and dangers of serious bodily injury, including permanent disability, paralysis, and death. I agree, on behalf of myself and on behalf of any minor child using the Transport Services for whom I am the parent or legal guardian, that the entire risk arising out of my use of the [employer] remains solely with me, to the maximum extent permitted under applicable law. 7. The [employer]  are provided "as is" and "as available." [place] disclaims all representations and warranties, express, implied or statutory, not expressly set out in these terms, including the implied warranties of merchantability and fitness for a particular purpose. 8. I hereby waive and release [place], its agents, employees, officers, directors, representatives, insurers, attorneys, assigns, successors, subsidiaries, and affiliates from any and all past, present, or future claims, demands, liabilities, actions, causes of action, or suits of any kind directly or indirectly arising from acceptance and use of the [employer]. 9. I further waive and release [place] and its affiliates from all present and future liability and responsibility for any injury or death to persons or damages to property caused by or related to the use of the [employer]. 10. I have read this Waiver and Release of Liability, and I understand the terms used in it and their legal significance. This Waiver is freely and voluntarily given with the understanding that my right (as well as the right of any minor child for whom I am the parent or legal guardian using the [employer]) to legal recourse against  in connection with the [employer] is knowingly surrendered in return for use of these services.   I attest that I read the consent document to Jacob [name], gave Mr. [place] the opportunity to ask questions and answered the questions asked (if any). I affirm that Jacob [name] then provided consent for he's participation in this program.     Katy Apo

## 2021-02-12 ENCOUNTER — Inpatient Hospital Stay: Payer: Medicare Other

## 2021-02-12 ENCOUNTER — Ambulatory Visit
Admission: RE | Admit: 2021-02-12 | Discharge: 2021-02-12 | Disposition: A | Discharge status: Home / Self Care | Payer: Medicare Other | Source: Ambulatory Visit | Attending: Radiation Oncology | Admitting: Radiation Oncology

## 2021-02-12 DIAGNOSIS — Z510 Encounter for antineoplastic radiation therapy: Secondary | ICD-10-CM | POA: Diagnosis not present

## 2021-02-15 ENCOUNTER — Inpatient Hospital Stay: Payer: Medicare Other

## 2021-02-15 ENCOUNTER — Ambulatory Visit
Admission: RE | Admit: 2021-02-15 | Discharge: 2021-02-15 | Disposition: A | Discharge status: Home / Self Care | Payer: Medicare Other | Source: Ambulatory Visit | Attending: Radiation Oncology | Admitting: Radiation Oncology

## 2021-02-15 DIAGNOSIS — Z510 Encounter for antineoplastic radiation therapy: Secondary | ICD-10-CM | POA: Diagnosis not present

## 2021-02-16 ENCOUNTER — Inpatient Hospital Stay: Payer: Medicare Other

## 2021-02-16 ENCOUNTER — Ambulatory Visit: Payer: Medicare Other

## 2021-02-17 ENCOUNTER — Inpatient Hospital Stay: Payer: Medicare Other

## 2021-02-17 ENCOUNTER — Ambulatory Visit
Admission: RE | Admit: 2021-02-17 | Discharge: 2021-02-17 | Disposition: A | Discharge status: Home / Self Care | Payer: Medicare Other | Source: Ambulatory Visit | Attending: Radiation Oncology | Admitting: Radiation Oncology

## 2021-02-17 DIAGNOSIS — Z510 Encounter for antineoplastic radiation therapy: Secondary | ICD-10-CM | POA: Diagnosis not present

## 2021-02-18 ENCOUNTER — Inpatient Hospital Stay: Payer: Medicare Other

## 2021-02-18 ENCOUNTER — Ambulatory Visit: Payer: Medicare Other

## 2021-02-18 ENCOUNTER — Inpatient Hospital Stay: Payer: Medicare Other | Admitting: Oncology

## 2021-02-19 ENCOUNTER — Ambulatory Visit: Payer: Medicare Other

## 2021-02-19 ENCOUNTER — Inpatient Hospital Stay: Payer: Medicare Other

## 2021-02-19 ENCOUNTER — Other Ambulatory Visit: Payer: Self-pay | Admitting: Licensed Clinical Social Worker

## 2021-02-22 ENCOUNTER — Ambulatory Visit
Admission: RE | Admit: 2021-02-22 | Discharge: 2021-02-22 | Disposition: A | Discharge status: Home / Self Care | Payer: Medicare Other | Source: Ambulatory Visit | Attending: Radiation Oncology | Admitting: Radiation Oncology

## 2021-02-22 ENCOUNTER — Inpatient Hospital Stay: Payer: Medicare Other

## 2021-02-22 DIAGNOSIS — Z510 Encounter for antineoplastic radiation therapy: Secondary | ICD-10-CM | POA: Diagnosis not present

## 2021-02-23 ENCOUNTER — Ambulatory Visit: Payer: Medicare Other

## 2021-02-23 ENCOUNTER — Inpatient Hospital Stay: Payer: Medicare Other

## 2021-02-24 ENCOUNTER — Inpatient Hospital Stay: Payer: Medicare Other

## 2021-02-24 ENCOUNTER — Ambulatory Visit
Admission: RE | Admit: 2021-02-24 | Discharge: 2021-02-24 | Disposition: A | Discharge status: Home / Self Care | Payer: Medicare Other | Source: Ambulatory Visit | Attending: Radiation Oncology | Admitting: Radiation Oncology

## 2021-02-24 DIAGNOSIS — Z510 Encounter for antineoplastic radiation therapy: Secondary | ICD-10-CM | POA: Diagnosis not present

## 2021-02-25 ENCOUNTER — Inpatient Hospital Stay: Payer: Medicare Other

## 2021-02-25 ENCOUNTER — Ambulatory Visit
Admission: RE | Admit: 2021-02-25 | Discharge: 2021-02-25 | Disposition: A | Discharge status: Home / Self Care | Payer: Medicare Other | Source: Ambulatory Visit | Attending: Radiation Oncology | Admitting: Radiation Oncology

## 2021-02-25 DIAGNOSIS — Z510 Encounter for antineoplastic radiation therapy: Secondary | ICD-10-CM | POA: Diagnosis not present

## 2021-02-25 NOTE — Progress Notes (Signed)
Nutrition Assessment:  Referral from OT  Patient with locally advanced prostate cancer.  Past medical history of BPH, bronchitis, stage II CKD, COPD, HTN. Patient receiving radiation.   Met with patient following radiation today.  Patient reports that he has a good appetite.  Says his memory is not good and leaves the stove on at times.  Lives alone as wife lives in nursing home.  Says that he is getting Meals on Wheels.  Can't remember what he ate for breakfast or lunch.  "They bring me milk and juice and I can't remember what else.  "I need help cleaning and cooking." Denies trouble chewing or swallowing foods.  Reports diarrhea.  Drinks ensure shakes.  Patient with small pressure ulcer on sacrum per nursing   Medications: Vit B 12  Labs: reviewed  Anthropometrics:   Height: 72 inches Weight: 126 lb 6 oz on 3/14 per [facility] 01/18/21 133 lb  BMI: 18 5% weight loss in the last month and half, concerning  Estimated Energy Needs  Kcals: 1700-2000 Protein: 85-100 Fluid: 1.7 L  NUTRITION DIAGNOSIS: Inadequate oral intake related to cancer and cancer related side effects as evidenced by 5% weight loss in the last month and half and decreased intake     INTERVENTION:  Discussed importance of eating foods high in calories and protein.  Handout of foods high in calories provided for patient.   Discussed easy to prepare foods (microwave) sandwich items and spreads. Complimentary case of ensure plus given to patient today.  Encouraged patient to drink at least 2 per day.  Message sent to [facility] to see if patient qualifies for any additional in home support.   Contact information provided  MONITORING, EVALUATION, GOAL: weight trends, intake   NEXT VISIT: Wednesday, April 6 phone call  Daenerys Buttram B. Zenia Resides, [place], [place] Registered Dietitian [phone] (mobile)

## 2021-02-26 ENCOUNTER — Inpatient Hospital Stay: Payer: Medicare Other

## 2021-02-26 ENCOUNTER — Ambulatory Visit
Admission: RE | Admit: 2021-02-26 | Discharge: 2021-02-26 | Disposition: A | Discharge status: Home / Self Care | Payer: Medicare Other | Source: Ambulatory Visit | Attending: Radiation Oncology | Admitting: Radiation Oncology

## 2021-02-26 DIAGNOSIS — Z510 Encounter for antineoplastic radiation therapy: Secondary | ICD-10-CM | POA: Diagnosis not present

## 2021-03-01 ENCOUNTER — Ambulatory Visit: Payer: Medicare Other

## 2021-03-01 ENCOUNTER — Inpatient Hospital Stay: Payer: Medicare Other

## 2021-03-01 ENCOUNTER — Inpatient Hospital Stay: Payer: Medicare Other | Admitting: Oncology

## 2021-03-02 ENCOUNTER — Ambulatory Visit
Admission: RE | Admit: 2021-03-02 | Discharge: 2021-03-02 | Disposition: A | Discharge status: Home / Self Care | Payer: Medicare Other | Source: Ambulatory Visit | Attending: Radiation Oncology | Admitting: Radiation Oncology

## 2021-03-02 ENCOUNTER — Inpatient Hospital Stay: Payer: Medicare Other

## 2021-03-02 DIAGNOSIS — Z510 Encounter for antineoplastic radiation therapy: Secondary | ICD-10-CM | POA: Diagnosis not present

## 2021-03-03 ENCOUNTER — Ambulatory Visit: Payer: Medicare Other

## 2021-03-03 ENCOUNTER — Ambulatory Visit
Admission: RE | Admit: 2021-03-03 | Discharge: 2021-03-03 | Disposition: A | Discharge status: Home / Self Care | Payer: Medicare Other | Source: Ambulatory Visit | Attending: Radiation Oncology | Admitting: Radiation Oncology

## 2021-03-03 ENCOUNTER — Inpatient Hospital Stay: Payer: Medicare Other

## 2021-03-03 DIAGNOSIS — Z510 Encounter for antineoplastic radiation therapy: Secondary | ICD-10-CM | POA: Diagnosis not present

## 2021-03-04 ENCOUNTER — Ambulatory Visit: Payer: Medicare Other

## 2021-03-04 ENCOUNTER — Ambulatory Visit
Admission: RE | Admit: 2021-03-04 | Discharge: 2021-03-04 | Disposition: A | Discharge status: Home / Self Care | Payer: Medicare Other | Source: Ambulatory Visit | Attending: Radiation Oncology | Admitting: Radiation Oncology

## 2021-03-04 DIAGNOSIS — Z510 Encounter for antineoplastic radiation therapy: Secondary | ICD-10-CM | POA: Diagnosis not present

## 2021-03-05 ENCOUNTER — Other Ambulatory Visit: Payer: Self-pay

## 2021-03-05 ENCOUNTER — Inpatient Hospital Stay: Payer: Medicare Other

## 2021-03-05 ENCOUNTER — Inpatient Hospital Stay (HOSPITAL_BASED_OUTPATIENT_CLINIC_OR_DEPARTMENT_OTHER): Payer: Medicare Other | Admitting: Oncology

## 2021-03-05 ENCOUNTER — Ambulatory Visit
Admission: RE | Admit: 2021-03-05 | Discharge: 2021-03-05 | Disposition: A | Discharge status: Home / Self Care | Payer: Medicare Other | Source: Ambulatory Visit | Attending: Radiation Oncology | Admitting: Radiation Oncology

## 2021-03-05 VITALS — BP 144/82 | HR 52 | Temp 96.6°F | Resp 18 | Wt 133.0 lb

## 2021-03-05 DIAGNOSIS — Z79818 Long term (current) use of other agents affecting estrogen receptors and estrogen levels: Secondary | ICD-10-CM | POA: Diagnosis not present

## 2021-03-05 DIAGNOSIS — C61 Malignant neoplasm of prostate: Secondary | ICD-10-CM | POA: Diagnosis present

## 2021-03-05 DIAGNOSIS — N400 Enlarged prostate without lower urinary tract symptoms: Secondary | ICD-10-CM | POA: Diagnosis not present

## 2021-03-05 DIAGNOSIS — N182 Chronic kidney disease, stage 2 (mild): Secondary | ICD-10-CM | POA: Diagnosis not present

## 2021-03-05 DIAGNOSIS — Z803 Family history of malignant neoplasm of breast: Secondary | ICD-10-CM | POA: Diagnosis not present

## 2021-03-05 DIAGNOSIS — Z8042 Family history of malignant neoplasm of prostate: Secondary | ICD-10-CM | POA: Diagnosis not present

## 2021-03-05 DIAGNOSIS — F17210 Nicotine dependence, cigarettes, uncomplicated: Secondary | ICD-10-CM | POA: Diagnosis not present

## 2021-03-05 DIAGNOSIS — Z510 Encounter for antineoplastic radiation therapy: Secondary | ICD-10-CM | POA: Diagnosis not present

## 2021-03-05 DIAGNOSIS — I129 Hypertensive chronic kidney disease with stage 1 through stage 4 chronic kidney disease, or unspecified chronic kidney disease: Secondary | ICD-10-CM | POA: Diagnosis not present

## 2021-03-05 LAB — CBC WITH DIFFERENTIAL/PLATELET
Abs Immature Granulocytes: 0.03 10*3/uL (ref 0.00–0.07)
Basophils Absolute: 0 10*3/uL (ref 0.0–0.1)
Basophils Relative: 1 %
Eosinophils Absolute: 0.2 10*3/uL (ref 0.0–0.5)
Eosinophils Relative: 5 %
HCT: 48 % (ref 39.0–52.0)
Hemoglobin: 15.6 g/dL (ref 13.0–17.0)
Immature Granulocytes: 1 %
Lymphocytes Relative: 10 %
Lymphs Abs: 0.4 10*3/uL — ABNORMAL LOW (ref 0.7–4.0)
MCH: 29.3 pg (ref 26.0–34.0)
MCHC: 32.5 g/dL (ref 30.0–36.0)
MCV: 90.2 fL (ref 80.0–100.0)
Monocytes Absolute: 0.4 10*3/uL (ref 0.1–1.0)
Monocytes Relative: 12 %
Neutro Abs: 2.7 10*3/uL (ref 1.7–7.7)
Neutrophils Relative %: 71 %
Platelets: 395 10*3/uL (ref 150–400)
RBC: 5.32 MIL/uL (ref 4.22–5.81)
RDW: 14.5 % (ref 11.5–15.5)
WBC: 3.8 10*3/uL — ABNORMAL LOW (ref 4.0–10.5)
nRBC: 0 % (ref 0.0–0.2)

## 2021-03-05 LAB — PSA: Prostatic Specific Antigen: 44.58 ng/mL — ABNORMAL HIGH (ref 0.00–4.00)

## 2021-03-05 LAB — COMPREHENSIVE METABOLIC PANEL
ALT: 31 U/L (ref 0–44)
AST: 37 U/L (ref 15–41)
Albumin: 3.4 g/dL — ABNORMAL LOW (ref 3.5–5.0)
Alkaline Phosphatase: 59 U/L (ref 38–126)
Anion gap: 9 (ref 5–15)
BUN: 20 mg/dL (ref 8–23)
CO2: 29 mmol/L (ref 22–32)
Calcium: 9.1 mg/dL (ref 8.9–10.3)
Chloride: 96 mmol/L — ABNORMAL LOW (ref 98–111)
Creatinine, Ser: 0.88 mg/dL (ref 0.61–1.24)
GFR, Estimated: >60 mL/min (ref >60)
Glucose, Bld: 75 mg/dL (ref 70–99)
Potassium: 3.9 mmol/L (ref 3.5–5.1)
Sodium: 134 mmol/L — ABNORMAL LOW (ref 135–145)
Total Bilirubin: 0.9 mg/dL (ref 0.3–1.2)
Total Protein: 7 g/dL (ref 6.5–8.1)

## 2021-03-05 MED ORDER — DEGARELIX ACETATE 80 MG ~~LOC~~ SOLR
80.0000 mg | Freq: Once | SUBCUTANEOUS | Status: AC
  Administered 2021-03-05: 80 mg via SUBCUTANEOUS
  Filled 2021-03-05: qty 4

## 2021-03-06 LAB — TESTOSTERONE: Testosterone: <3 ng/dL — ABNORMAL LOW (ref 264–916)

## 2021-03-08 ENCOUNTER — Ambulatory Visit: Payer: Medicare Other

## 2021-03-09 ENCOUNTER — Ambulatory Visit: Payer: Medicare Other

## 2021-03-09 ENCOUNTER — Ambulatory Visit
Admission: RE | Admit: 2021-03-09 | Discharge: 2021-03-09 | Disposition: A | Discharge status: Home / Self Care | Payer: Medicare Other | Source: Ambulatory Visit | Attending: Radiation Oncology | Admitting: Radiation Oncology

## 2021-03-09 DIAGNOSIS — Z510 Encounter for antineoplastic radiation therapy: Secondary | ICD-10-CM | POA: Diagnosis not present

## 2021-03-10 ENCOUNTER — Ambulatory Visit: Payer: Medicare Other

## 2021-03-10 ENCOUNTER — Ambulatory Visit
Admission: RE | Admit: 2021-03-10 | Discharge: 2021-03-10 | Disposition: A | Discharge status: Home / Self Care | Payer: Medicare Other | Source: Ambulatory Visit | Attending: Radiation Oncology | Admitting: Radiation Oncology

## 2021-03-10 DIAGNOSIS — Z510 Encounter for antineoplastic radiation therapy: Secondary | ICD-10-CM | POA: Diagnosis not present

## 2021-03-11 ENCOUNTER — Ambulatory Visit
Admission: RE | Admit: 2021-03-11 | Discharge: 2021-03-11 | Disposition: A | Discharge status: Home / Self Care | Payer: Medicare Other | Source: Ambulatory Visit | Attending: Radiation Oncology | Admitting: Radiation Oncology

## 2021-03-11 DIAGNOSIS — Z510 Encounter for antineoplastic radiation therapy: Secondary | ICD-10-CM | POA: Diagnosis not present

## 2021-03-14 ENCOUNTER — Encounter: Payer: Self-pay | Admitting: Oncology

## 2021-03-14 NOTE — Progress Notes (Signed)
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 Hyperemia may also suggest infection/proctitis. This was followed by a colonoscopy on 01/06/2021 which showed a near obstructing mass in the rectum 1 cm from the anal verge. Scope could not be traversed beyond the mass. Biopsy was taken and was positive for adenocarcinoma.Patient ishere with his son today and reports that he can still move his bowels but sometimes he has constipation and sometimes diarrhea. He does not have a good insight into his medical problems. Denies any significant pain but does report occasional rectal discomfort. He is independent of his ADLs  Rectal biopsy showed adenocarcinoma poorly differentiated. Tumor was positive for PSA and PSAP but negative for CK7, CK20 and CDX2. Findings are consistent with adenocarcinoma from prostate origin.  Interval history-patient reports that he is at his baseline state of health.Issues with bowel movements.  Patient completing radiation treatment on 03/09/2021.  ECOG PS- 2 Pain scale- 0   Review of systems- Review of Systems  Constitutional: Positive for malaise/fatigue. Negative for chills, fever and weight loss.  HENT: Negative for  congestion, ear discharge and nosebleeds.   Eyes: Negative for blurred vision.  Respiratory: Negative for cough, hemoptysis, sputum production, shortness of breath and wheezing.   Cardiovascular: Negative for chest pain, palpitations, orthopnea and claudication.  Gastrointestinal: Negative for abdominal pain, blood in stool, constipation, diarrhea, heartburn, melena, nausea and vomiting.  Genitourinary: Negative for dysuria, flank pain, frequency, hematuria and urgency.  Musculoskeletal: Negative for back pain, joint pain and myalgias.  Skin: Negative for rash.  Neurological: Negative for dizziness, tingling, focal weakness, seizures, weakness and headaches.  Endo/Heme/Allergies: Does not bruise/bleed easily.  Psychiatric/Behavioral: Negative for depression and suicidal ideas. The patient does not have insomnia.        No Known Allergies   Past Medical History:  Diagnosis Date  . BPH (benign prostatic hyperplasia)   . Cancer ([place])   . Chronic constipation 06/25/2015  . COPD (chronic obstructive pulmonary disease) ([place])    pt denies.  . Family history of breast cancer   . Family history of prostate cancer   . Hypertension   . Swelling of lower extremity    bilateral  . Wears dentures    full upper and lower - do not fit well.     Past Surgical History:  Procedure Laterality Date  . CATARACT EXTRACTION W/PHACO Right 09/30/2015   Procedure: CATARACT EXTRACTION PHACO AND INTRAOCULAR LENS PLACEMENT ([place]);  Surgeon: Leandrew Koyanagi, MD;  Location: [facility];  Service: Ophthalmology;  Laterality: Right;  . COLONOSCOPY  2009?  . COLONOSCOPY WITH PROPOFOL N/A 01/06/2021   Procedure: COLONOSCOPY WITH PROPOFOL;  Surgeon: Lin Landsman, MD;  Location: [facility] ENDOSCOPY;  Service: Gastroenterology;  Laterality: N/A;  . HERNIA REPAIR Right    x2     Social History  Socioeconomic History  . Marital status: Married    Spouse name: Not on file  . Number of children:  Not on file  . Years of education: Not on file  . Highest education level: Not on file  Occupational History  . Not on file  Tobacco Use  . Smoking status: Current Every Day Smoker    Packs/day: 0.25    Years: 45.00    Pack years: 11.25    Types: Cigarettes  . Smokeless tobacco: Never Used  Vaping Use  . Vaping Use: Never used  Substance and Sexual Activity  . Alcohol use: Yes    Comment: drink beer maybe 2 - 3 weeks  . Drug use: No  . Sexual activity: Not on file  Other Topics Concern  . Not on file  Social History Narrative  . Not on file   Social Determinants of Health   Financial Resource Strain: Not on file  Food Insecurity: Not on file  Transportation Needs: Not on file  Physical Activity: Not on file  Stress: Not on file  Social Connections: Not on file  Intimate Partner Violence: Not on file    Family History  Problem Relation Age of Onset  . Prostate cancer Brother   . Alzheimer's disease Mother   . Breast cancer Mother        dx 60 or 25  . Stroke Father   . Heart attack Daughter   . Kidney cancer Neg Hx   . Kidney disease Neg Hx   . Bladder Cancer Neg Hx      Current Outpatient Medications:  .  acetaminophen (TYLENOL) 500 MG tablet, Take 500 mg by mouth every 4 (four) hours as needed. , Disp: , Rfl:  .  amitriptyline (ELAVIL) 10 MG tablet, 10 mg at bedtime., Disp: , Rfl:  .  amLODipine (NORVASC) 10 MG tablet, Take 10 mg by mouth daily. PM, Disp: , Rfl:  .  aspirin 81 MG tablet, Take 81 mg by mouth daily. PM, Disp: , Rfl:  .  cetirizine (ZYRTEC) 10 MG tablet, Take by mouth., Disp: , Rfl:  .  cyanocobalamin 500 MCG tablet, Take 500 mcg by mouth daily. PM, Disp: , Rfl:  .  cyclobenzaprine (FLEXERIL) 5 MG tablet, Take by mouth., Disp: , Rfl:  .  finasteride (PROSCAR) 5 MG tablet, TAKE 1 TABLET BY MOUTH EVERY DAY, Disp: 90 tablet, Rfl: 3 .  hydrochlorothiazide (HYDRODIURIL) 25 MG tablet, Take 25 mg by mouth daily. PM, Disp: , Rfl:  .  metoprolol  succinate (TOPROL-XL) 50 MG 24 hr tablet, Take 50 mg by mouth daily., Disp: , Rfl:  .  nortriptyline (PAMELOR) 10 MG capsule, Take 10 mg by mouth at bedtime., Disp: , Rfl:  .  polyethylene glycol powder (GLYCOLAX/MIRALAX) powder, TAKE 17G ONCE DAILY AS NEEDED FOR CONSTIPATION (ADJUST AMOUNT AS NEEDED) MIX IN 4-8 OZ OF FLUID, Disp: , Rfl:  .  Propylene Glycol (SYSTANE BALANCE) 0.6 % SOLN, INSTILL 3 5 TIMES A DAY IN EACH EYE AS NEEDED FOR DRYNESS., Disp: , Rfl:  .  tamsulosin (FLOMAX) 0.4 MG CAPS capsule, TAKE 1 CAPSULE BY MOUTH EVERY DAY, Disp: 90 capsule, Rfl: 3  Physical exam:  Vitals:   03/05/21 1438  BP: (!) 144/82  Pulse: (!) 52  Resp: 18  Temp: (!) 96.6 F (35.9 C)  TempSrc: Tympanic  SpO2: 99%  Weight: 133 lb (60.3 kg)   Physical Exam Constitutional:      General: He is not in acute distress.  Comments: Elderly gentleman sitting in a wheelchair  Cardiovascular:     Rate and Rhythm: Normal rate and regular rhythm.     Heart sounds: Normal heart sounds.  Pulmonary:     Effort: Pulmonary effort is normal.     Breath sounds: Normal breath sounds.  Abdominal:     General: Bowel sounds are normal.     Palpations: Abdomen is soft.  Skin:    General: Skin is warm and dry.  Neurological:     Mental Status: He is alert and oriented to person, place, and time.      CMP Latest Ref Rng & Units 03/05/2021  Glucose 70 - 99 mg/dL 75  BUN 8 - 23 mg/dL 20  Creatinine 0.61 - 1.24 mg/dL 0.88  Sodium 135 - 145 mmol/L 134(L)  Potassium 3.5 - 5.1 mmol/L 3.9  Chloride 98 - 111 mmol/L 96(L)  CO2 22 - 32 mmol/L 29  Calcium 8.9 - 10.3 mg/dL 9.1  Total Protein 6.5 - 8.1 g/dL 7.0  Total Bilirubin 0.3 - 1.2 mg/dL 0.9  Alkaline Phos 38 - 126 U/L 59  AST 15 - 41 U/L 37  ALT 0 - 44 U/L 31   CBC Latest Ref Rng & Units 03/05/2021  WBC 4.0 - 10.5 K/uL 3.8(L)  Hemoglobin 13.0 - 17.0 g/dL 15.6  Hematocrit 39.0 - 52.0 % 48.0  Platelets 150 - 400 K/uL 395      Assessment and plan- Patient  is a 87 y.o. male with metastatic prostate cancer and bone metastases here for routine follow-up  Patient will receive his second dose of Firmagon today.  His PSA has come down from baseline of5 49-44.58 today.  Patient will be completing radiation treatment on 03/09/2021.  I will see him back in 1 month's time with CBC with differential CMP and PSA and discuss start of oral androgen therapy at that time as well as switch him to Lupron.  I will consider repeating his MRI pelvis as well as flexible sigmoidoscopy in 2 months time   Visit Diagnosis 1. Prostate cancer ([place])   2. Encounter for monitoring androgen deprivation therapy      Dr. Randa Evens, MD, MPH [facility] [phone] 03/14/2021 4:40 PM

## 2021-03-17 ENCOUNTER — Other Ambulatory Visit: Payer: Self-pay

## 2021-03-17 ENCOUNTER — Telehealth: Payer: Self-pay

## 2021-03-17 ENCOUNTER — Inpatient Hospital Stay: Payer: Medicare Other | Attending: Oncology

## 2021-03-17 DIAGNOSIS — F17210 Nicotine dependence, cigarettes, uncomplicated: Secondary | ICD-10-CM | POA: Insufficient documentation

## 2021-03-17 DIAGNOSIS — I129 Hypertensive chronic kidney disease with stage 1 through stage 4 chronic kidney disease, or unspecified chronic kidney disease: Secondary | ICD-10-CM | POA: Insufficient documentation

## 2021-03-17 DIAGNOSIS — N182 Chronic kidney disease, stage 2 (mild): Secondary | ICD-10-CM | POA: Insufficient documentation

## 2021-03-17 DIAGNOSIS — Z8042 Family history of malignant neoplasm of prostate: Secondary | ICD-10-CM | POA: Insufficient documentation

## 2021-03-17 DIAGNOSIS — Z803 Family history of malignant neoplasm of breast: Secondary | ICD-10-CM | POA: Insufficient documentation

## 2021-03-17 DIAGNOSIS — N400 Enlarged prostate without lower urinary tract symptoms: Secondary | ICD-10-CM | POA: Insufficient documentation

## 2021-03-17 DIAGNOSIS — C61 Malignant neoplasm of prostate: Secondary | ICD-10-CM

## 2021-03-17 NOTE — Progress Notes (Signed)
Nutrition Follow-up:  Patient with locally advanced prostate cancer.  Patient has completed radiation.   Spoke with patient via phone for nutrition follow-up today.  Patient reports that his appetite is good and he is eating more.  "Did you see my weight went up."  Reports that he is eating all kinds of foods.  Drinking ensure shakes a couple of times per day.    Reports that he noticed a little bit of blood in his stool several days ago but has not noticed any since.  Also fell while bending over and his right shoulder hurts. Says that he felt dizzy and fell.      Medications: reviewed  Labs: reviewed  Anthropometrics:   Weight 133 lb on 3/25 increased from 126 lb on 3/14 per [facility]  133 lb on 2/7  NUTRITION DIAGNOSIS: Inadequate oral intake improving   INTERVENTION:  Message sent to NP in [facility] regarding patient's report of blood in stool and recent fall and NP will call patient. Encouraged patient to continue eating high calorie and high protein foods Complimentary case of ensure plus will be left at front desk on 4/7 for patient pick up.  Message sent to [facility] to reach out to patient as patient has questions regarding assistance in the home.       MONITORING, EVALUATION, GOAL: weight trends, intake   NEXT VISIT: May 5th  (Thursday) after MD visit  Dashanti Burr B. Zenia Resides, [place], [place] Registered Dietitian [phone] (mobile)

## 2021-03-17 NOTE — Telephone Encounter (Signed)
Attempted to call patient x2 re: symptoms reported to RD (see note). No answer. Attempted to call family members listed as emergency contacts, but no answer. No voicemail set up to leave message. Will f/u and attempt to call back at a later time.

## 2021-03-17 NOTE — Telephone Encounter (Signed)
Able to reach patient on 3rd attempt at phone call to f/u with symptoms reported to RD. Patient stated that he only had one instance of blood in his stool, and has not had any since. He stated that he just felt weak and tired and did not have the strength to perform minor household tasks. He stated that he fell the other day due to dizziness, and hurt his shoulder, but stated that it was feeling better. Encouraged patient to come in to [facility] to be seen today or tomorrow. He declined the offer, but requested an appointment for Friday. Appointment scheduled for 2pm. Pt verbalized understanding.

## 2021-03-19 ENCOUNTER — Inpatient Hospital Stay: Payer: Medicare Other

## 2021-03-19 ENCOUNTER — Inpatient Hospital Stay (HOSPITAL_BASED_OUTPATIENT_CLINIC_OR_DEPARTMENT_OTHER): Payer: Medicare Other | Admitting: Oncology

## 2021-03-19 ENCOUNTER — Other Ambulatory Visit: Payer: Self-pay

## 2021-03-19 VITALS — BP 119/71 | HR 54 | Temp 96.1°F | Resp 16 | Wt 129.0 lb

## 2021-03-19 DIAGNOSIS — C61 Malignant neoplasm of prostate: Secondary | ICD-10-CM | POA: Diagnosis not present

## 2021-03-19 DIAGNOSIS — N400 Enlarged prostate without lower urinary tract symptoms: Secondary | ICD-10-CM | POA: Diagnosis not present

## 2021-03-19 DIAGNOSIS — R531 Weakness: Secondary | ICD-10-CM

## 2021-03-19 DIAGNOSIS — M6281 Muscle weakness (generalized): Secondary | ICD-10-CM

## 2021-03-19 DIAGNOSIS — Z8042 Family history of malignant neoplasm of prostate: Secondary | ICD-10-CM | POA: Diagnosis not present

## 2021-03-19 DIAGNOSIS — F17210 Nicotine dependence, cigarettes, uncomplicated: Secondary | ICD-10-CM | POA: Diagnosis not present

## 2021-03-19 DIAGNOSIS — I129 Hypertensive chronic kidney disease with stage 1 through stage 4 chronic kidney disease, or unspecified chronic kidney disease: Secondary | ICD-10-CM | POA: Diagnosis not present

## 2021-03-19 DIAGNOSIS — Z803 Family history of malignant neoplasm of breast: Secondary | ICD-10-CM | POA: Diagnosis not present

## 2021-03-19 DIAGNOSIS — N182 Chronic kidney disease, stage 2 (mild): Secondary | ICD-10-CM | POA: Diagnosis not present

## 2021-03-19 LAB — CBC WITH DIFFERENTIAL/PLATELET
Abs Immature Granulocytes: 0.01 10*3/uL (ref 0.00–0.07)
Basophils Absolute: 0 10*3/uL (ref 0.0–0.1)
Basophils Relative: 1 %
Eosinophils Absolute: 0 10*3/uL (ref 0.0–0.5)
Eosinophils Relative: 0 %
HCT: 47.3 % (ref 39.0–52.0)
Hemoglobin: 15.5 g/dL (ref 13.0–17.0)
Immature Granulocytes: 0 %
Lymphocytes Relative: 21 %
Lymphs Abs: 0.5 10*3/uL — ABNORMAL LOW (ref 0.7–4.0)
MCH: 29 pg (ref 26.0–34.0)
MCHC: 32.8 g/dL (ref 30.0–36.0)
MCV: 88.6 fL (ref 80.0–100.0)
Monocytes Absolute: 0.7 10*3/uL (ref 0.1–1.0)
Monocytes Relative: 30 %
Neutro Abs: 1.1 10*3/uL — ABNORMAL LOW (ref 1.7–7.7)
Neutrophils Relative %: 48 %
Platelets: 304 10*3/uL (ref 150–400)
RBC: 5.34 MIL/uL (ref 4.22–5.81)
RDW: 14.3 % (ref 11.5–15.5)
WBC: 2.3 10*3/uL — ABNORMAL LOW (ref 4.0–10.5)
nRBC: 0 % (ref 0.0–0.2)

## 2021-03-19 LAB — COMPREHENSIVE METABOLIC PANEL
ALT: 20 U/L (ref 0–44)
AST: 46 U/L — ABNORMAL HIGH (ref 15–41)
Albumin: 3.6 g/dL (ref 3.5–5.0)
Alkaline Phosphatase: 51 U/L (ref 38–126)
Anion gap: 12 (ref 5–15)
BUN: 25 mg/dL — ABNORMAL HIGH (ref 8–23)
CO2: 27 mmol/L (ref 22–32)
Calcium: 8.7 mg/dL — ABNORMAL LOW (ref 8.9–10.3)
Chloride: 96 mmol/L — ABNORMAL LOW (ref 98–111)
Creatinine, Ser: 1.11 mg/dL (ref 0.61–1.24)
GFR, Estimated: >60 mL/min (ref >60)
Glucose, Bld: 104 mg/dL — ABNORMAL HIGH (ref 70–99)
Potassium: 3.9 mmol/L (ref 3.5–5.1)
Sodium: 135 mmol/L (ref 135–145)
Total Bilirubin: 1.2 mg/dL (ref 0.3–1.2)
Total Protein: 7 g/dL (ref 6.5–8.1)

## 2021-03-19 MED ORDER — SODIUM CHLORIDE 0.9 % IV SOLN
INTRAVENOUS | Status: DC
  Filled 2021-03-19 (×2): qty 250

## 2021-03-19 MED ORDER — SODIUM CHLORIDE 0.9 % IV SOLN: 10.0000 mg | Freq: Once | INTRAVENOUS | Status: DC

## 2021-03-19 MED ORDER — DEXAMETHASONE SODIUM PHOSPHATE 10 MG/ML IJ SOLN
10.0000 mg | Freq: Once | INTRAMUSCULAR | Status: AC
  Administered 2021-03-19: 10 mg via INTRAVENOUS
  Filled 2021-03-19: qty 1

## 2021-03-19 NOTE — Progress Notes (Signed)
Symptom Management Consult note [facility]  [phone] [phone]  Patient Care Team: [name], MD as PCP - General (Internal Medicine) Clent Jacks, RN as Oncology Nurse Navigator   Name of the patient: Jacob [name]  [id number]  October 28, 1934   Date of visit: 03/19/2021   Diagnosis- Prostate Cancer   Chief complaint/ Reason for visit- weakness  Heme/Onc history: patient is a 87 year old male with a past medical history significant for BPH, Chronic bronchitis and stage II CKD who underwent a CT angio abdomen and pelvis with contrast on 10/08/2020 at that time he had diffuse thickening and hyperemia of the distal rectum concerning for potential tumor.  Hyperemia may also suggest infection/proctitis.  This was followed by a colonoscopy on 01/06/2021 which showed a near obstructing mass in the rectum 1 cm from the anal verge.  Scope could not be traversed beyond the mass.  Biopsy was taken and was positive for adenocarcinoma.  Patient is here with his son today and reports that he can still move his bowels but sometimes he has constipation and sometimes diarrhea.  He does not have a good insight into his medical problems.  Denies any significant pain but does report occasional rectal discomfort. He is independent of his ADLs   Rectal biopsy showed adenocarcinoma poorly differentiated.  Tumor was positive for PSA and PSAP but negative for CK7, CK20 and CDX2.  Findings are consistent with adenocarcinoma from prostate origin.  Interval history- Patient is a 88 y.o. male who presents to symptom management for generalized weakness and falls X 2 weeks. Recently completed XRT on 03/09/21.  Reports diarrhea that has not improved since completing radiation.  His appetite has been fair.  Reports several falls over the past couple weeks.  Denies any injury and does not want any additional work-up.  He lives alone.  ECOG FS:2 - Symptomatic, <50% confined to  bed  Review of systems- Review of Systems  Constitutional:  Positive for malaise/fatigue and weight loss. Negative for chills and fever.  HENT:  Negative for congestion, ear pain and tinnitus.   Eyes: Negative.  Negative for blurred vision and double vision.  Respiratory: Negative.  Negative for cough, sputum production and shortness of breath.   Cardiovascular: Negative.  Negative for chest pain, palpitations and leg swelling.  Gastrointestinal:  Positive for diarrhea. Negative for abdominal pain, constipation, nausea and vomiting.  Genitourinary:  Negative for dysuria, frequency and urgency.  Musculoskeletal:  Positive for falls and joint pain. Negative for back pain.  Skin: Negative.  Negative for rash.  Neurological: Negative.  Negative for weakness and headaches.  Endo/Heme/Allergies: Negative.  Does not bruise/bleed easily.  Psychiatric/Behavioral: Negative.  Negative for depression. The patient is not nervous/anxious and does not have insomnia.     Current treatment- S/p XRT to rectum on 03/09/21.  No Known Allergies   Past Medical History:  Diagnosis Date   BPH (benign prostatic hyperplasia)    Cancer (HCC)    Chronic constipation 06/25/2015   COPD (chronic obstructive pulmonary disease) ([place])    pt denies.   Family history of breast cancer    Family history of prostate cancer    Hypertension    Swelling of lower extremity    bilateral   Wears dentures    full upper and lower - do not fit well.     Past Surgical History:  Procedure Laterality Date   CATARACT EXTRACTION W/PHACO Right 09/30/2015   Procedure: CATARACT EXTRACTION PHACO  AND INTRAOCULAR LENS PLACEMENT (IOC);  Surgeon: Leandrew Koyanagi, MD;  Location: [facility];  Service: Ophthalmology;  Laterality: Right;   COLONOSCOPY  2009?   COLONOSCOPY WITH PROPOFOL N/A 01/06/2021   Procedure: COLONOSCOPY WITH PROPOFOL;  Surgeon: Lin Landsman, MD;  Location: [facility] ENDOSCOPY;  Service:  Gastroenterology;  Laterality: N/A;   HERNIA REPAIR Right    x2     Social History   Socioeconomic History   Marital status: Married    Spouse name: Not on file   Number of children: Not on file   Years of education: Not on file   Highest education level: Not on file  Occupational History   Not on file  Tobacco Use   Smoking status: Every Day    Packs/day: 0.25    Years: 45.00    Pack years: 11.25    Types: Cigarettes   Smokeless tobacco: Never  Vaping Use   Vaping Use: Never used  Substance and Sexual Activity   Alcohol use: Yes    Comment: drink beer maybe 2 - 3 weeks   Drug use: No   Sexual activity: Not on file  Other Topics Concern   Not on file  Social History Narrative   Not on file   Social Determinants of Health   Financial Resource Strain: Not on file  Food Insecurity: Not on file  Transportation Needs: Not on file  Physical Activity: Not on file  Stress: Not on file  Social Connections: Not on file  Intimate Partner Violence: Not on file    Family History  Problem Relation Age of Onset   Prostate cancer Brother    Alzheimer's disease Mother    Breast cancer Mother        dx 50 or 61   Stroke Father    Heart attack Daughter    Kidney cancer Neg Hx    Kidney disease Neg Hx    Bladder Cancer Neg Hx      Current Outpatient Medications:    acetaminophen (TYLENOL) 500 MG tablet, Take 500 mg by mouth every 4 (four) hours as needed. , Disp: , Rfl:    amitriptyline (ELAVIL) 10 MG tablet, 10 mg at bedtime., Disp: , Rfl:    amLODipine (NORVASC) 10 MG tablet, Take 10 mg by mouth daily. PM, Disp: , Rfl:    aspirin 81 MG tablet, Take 81 mg by mouth daily. PM, Disp: , Rfl:    cetirizine (ZYRTEC) 10 MG tablet, Take by mouth., Disp: , Rfl:    cyanocobalamin 500 MCG tablet, Take 500 mcg by mouth daily. PM, Disp: , Rfl:    cyclobenzaprine (FLEXERIL) 5 MG tablet, Take by mouth., Disp: , Rfl:    finasteride (PROSCAR) 5 MG tablet, TAKE 1 TABLET BY MOUTH EVERY  DAY, Disp: 90 tablet, Rfl: 3   hydrochlorothiazide (HYDRODIURIL) 25 MG tablet, Take 25 mg by mouth daily. PM, Disp: , Rfl:    metoprolol succinate (TOPROL-XL) 50 MG 24 hr tablet, Take 50 mg by mouth daily., Disp: , Rfl:    nortriptyline (PAMELOR) 10 MG capsule, Take 10 mg by mouth at bedtime., Disp: , Rfl:    polyethylene glycol powder (GLYCOLAX/MIRALAX) powder, TAKE 17G ONCE DAILY AS NEEDED FOR CONSTIPATION (ADJUST AMOUNT AS NEEDED) MIX IN 4-8 OZ OF FLUID, Disp: , Rfl:    Propylene Glycol (SYSTANE BALANCE) 0.6 % SOLN, INSTILL 3 5 TIMES A DAY IN EACH EYE AS NEEDED FOR DRYNESS., Disp: , Rfl:    tamsulosin (FLOMAX) 0.4 MG CAPS  capsule, TAKE 1 CAPSULE BY MOUTH EVERY DAY, Disp: 90 capsule, Rfl: 3  Physical exam:  Vitals:   03/19/21 1420  BP: 119/71  Pulse: (!) 54  Resp: 16  Temp: (!) 96.1 F (35.6 C)  TempSrc: Tympanic  Weight: 129 lb (58.5 kg)   Physical Exam Constitutional:      Appearance: Normal appearance.  HENT:     Head: Normocephalic and atraumatic.  Eyes:     Pupils: Pupils are equal, round, and reactive to light.  Cardiovascular:     Rate and Rhythm: Normal rate and regular rhythm.     Heart sounds: Normal heart sounds. No murmur heard. Pulmonary:     Effort: Pulmonary effort is normal.     Breath sounds: Normal breath sounds. No wheezing.  Abdominal:     General: Bowel sounds are normal. There is no distension.     Palpations: Abdomen is soft.     Tenderness: There is no abdominal tenderness.  Musculoskeletal:        General: Normal range of motion.     Cervical back: Normal range of motion.  Skin:    General: Skin is warm and dry.     Findings: No rash.  Neurological:     Mental Status: He is alert and oriented to person, place, and time.  Psychiatric:        Judgment: Judgment normal.     CMP Latest Ref Rng & Units 06/10/2021  Glucose 70 - 99 mg/dL -  BUN 8 - 23 mg/dL -  Creatinine 0.61 - 1.24 mg/dL 1.10  Sodium 135 - 145 mmol/L -  Potassium 3.5 - 5.1  mmol/L -  Chloride 98 - 111 mmol/L -  CO2 22 - 32 mmol/L -  Calcium 8.9 - 10.3 mg/dL -  Total Protein 6.5 - 8.1 g/dL -  Total Bilirubin 0.3 - 1.2 mg/dL -  Alkaline Phos 38 - 126 U/L -  AST 15 - 41 U/L -  ALT 0 - 44 U/L -   CBC Latest Ref Rng & Units 04/13/2021  WBC 4.0 - 10.5 K/uL 3.3(L)  Hemoglobin 13.0 - 17.0 g/dL 13.9  Hematocrit 39.0 - 52.0 % 43.7  Platelets 150 - 400 K/uL 312    No images are attached to the encounter.  NM Bone Scan Whole Body  Result Date: 06/02/2021 CLINICAL DATA:  Metastatic prostate cancer, surveillance EXAM: NUCLEAR MEDICINE WHOLE BODY BONE SCAN TECHNIQUE: Whole body anterior and posterior images were obtained approximately 3 hours after intravenous injection of radiopharmaceutical. RADIOPHARMACEUTICALS:  21.9 mCi Technetium-54m MDP IV COMPARISON:  01/25/2021 Imaging correlation: None since prior bone scintigraphy FINDINGS: Multiple sites of abnormal osseous tracer accumulation consistent with osseous metastatic disease. These include BILATERAL ribs, thoracic spine, pelvis. No new sites of abnormal osseous tracer uptake are identified. Foci of uptake seen previously at the inferior aspect of the posterior RIGHT acetabulum, posterior upper RIGHT second rib, and at the posterior LEFT eighth rib are no longer seen. Degree of uptake at additional sites has diminished since the prior study. Expected urinary tract and soft tissue distribution of tracer. IMPRESSION: Scattered osseous metastases as above. No new sites of tracer uptake are identified and several of the previously seen sites are either no longer identified or demonstrate diminished tracer uptake versus prior study. Electronically Signed   By: Lavonia Dana M.D.   On: 06/02/2021 13:54   CT Abdomen Pelvis W Contrast  Result Date: 06/11/2021 CLINICAL DATA:  Metastatic prostate carcinoma. EXAM: CT ABDOMEN AND PELVIS  WITH CONTRAST TECHNIQUE: Multidetector CT imaging of the abdomen and pelvis was performed using the  standard protocol following bolus administration of intravenous contrast. CONTRAST:  84mL OMNIPAQUE IOHEXOL 300 MG/ML  SOLN COMPARISON:  Abdomen only CT on 01/21/2021, and pelvis MRI on 01/27/2021 FINDINGS: Lower Chest: No acute findings. Hepatobiliary: A few tiny sub-cm cysts are noted. A few other tiny sub-cm low-attenuation lesions are too small to characterize but likely represent tiny cysts or hemangiomas. Several small approximately 1 cm calcified gallstones are seen, however there is no evidence of cholecystitis or biliary ductal dilatation. Pancreas:  No mass or inflammatory changes. Spleen: Within normal limits in size and appearance. Adrenals/Urinary Tract: Stable 1.5 cm homogeneous left adrenal mass, consistent with benign adenoma. Kidneys are enlarged with multiple cysts throughout both, however there is no evidence of solid renal masses or hydronephrosis. Mass effect noted on bladder base from enlarged prostate. Stomach/Bowel: Diffuse rectal wall thickening is again seen, without significant change to recent MRI. This is contiguous with the posterior wall of the prostate. Vascular/Lymphatic: No pelvic lymphadenopathy identified. An 8 mm left paraaortic lymph node is seen on image 22/2, without significant change compared to prior study. No acute vascular findings. Reproductive: Mild to moderately enlarged prostate gland remains stable, with abnormal soft tissue density posteriorly which is contiguous with the rectum. Other:  None. Musculoskeletal: Diffuse bone metastases are seen throughout the spine and pelvis. Increase size and number of sclerotic bone metastases seen compared to a prior pelvis CT on 10/08/2020. IMPRESSION: Mild to moderately enlarged prostate gland, with abnormal soft tissue density which is contiguous with the rectum which shows diffuse wall thickening. This is similar in appearance to previous studies. Stable 8 mm left paraaortic retroperitoneal lymph node. No other lymphadenopathy  identified. Diffuse bone metastases, with increase since 10/08/2020 pelvis CT. Cholelithiasis. No radiographic evidence of cholecystitis. Stable enlarged kidneys with numerous cysts. Electronically Signed   By: Marlaine Hind M.D.   On: 06/11/2021 12:36     Assessment and plan- Patient is a 87 y.o. male who presents to symptom management for generalized weakness and falls X 2 weeks.   Prostate cancer with metastatic disease to the rectum: -He is status post XRT to the rectum most recently.  Completed XRT on 03/09/2021. -He also receives Can[place]ilon every 6 months.  Weakness: -Secondary to recent XRT and diarrhea. -Reports several falls over the past week or so. -Denies any injury and does not want any additional work-up for this. -Lives alone -Recommend outpatient OT, PT, home health RN and aide. -Recommend outpatient palliative care services. -She is already followed by our dietitian and our rehab services.  Disposition: -Referral sent for outpatient OT, PT, home health RN and aide and palliative care services. -Keep all scheduled appointments with Dr. Janese Banks.   Visit Diagnosis 1. Weakness     Patient expressed understanding and was in agreement with this plan. He also understands that He can call clinic at any time with any questions, concerns, or complaints.   Greater than 50% was spent in counseling and coordination of care with this patient including but not limited to discussion of the relevant topics above (See A&P) including, but not limited to diagnosis and management of acute and chronic medical conditions.   Thank you for allowing me to participate in the care of this very pleasant patient.    Jacquelin Hawking, NP [place] [facility] - [phone] Pager- [phone] 06/15/2021 3:16 PM

## 2021-03-19 NOTE — Progress Notes (Signed)
Pt reports multiple falls in the last few days. Pt states "I'm weak and need some help". He reports that he fell about a week ago and hurt his shoulder, but states that his shoulder feels better and states "I'm not worried about that".

## 2021-03-26 ENCOUNTER — Inpatient Hospital Stay: Payer: Medicare Other

## 2021-03-26 ENCOUNTER — Encounter: Payer: Self-pay | Admitting: Nurse Practitioner

## 2021-03-26 ENCOUNTER — Other Ambulatory Visit: Payer: Self-pay

## 2021-03-26 ENCOUNTER — Inpatient Hospital Stay (HOSPITAL_BASED_OUTPATIENT_CLINIC_OR_DEPARTMENT_OTHER): Payer: Medicare Other | Admitting: Nurse Practitioner

## 2021-03-26 VITALS — BP 117/83 | HR 98 | Temp 97.8°F | Resp 20 | Wt 127.6 lb

## 2021-03-26 DIAGNOSIS — M6281 Muscle weakness (generalized): Secondary | ICD-10-CM

## 2021-03-26 DIAGNOSIS — Z5189 Encounter for other specified aftercare: Secondary | ICD-10-CM | POA: Diagnosis not present

## 2021-03-26 DIAGNOSIS — R531 Weakness: Secondary | ICD-10-CM

## 2021-03-26 DIAGNOSIS — C61 Malignant neoplasm of prostate: Secondary | ICD-10-CM | POA: Diagnosis not present

## 2021-03-26 LAB — COMPREHENSIVE METABOLIC PANEL
ALT: 25 U/L (ref 0–44)
AST: 46 U/L — ABNORMAL HIGH (ref 15–41)
Albumin: 3.4 g/dL — ABNORMAL LOW (ref 3.5–5.0)
Alkaline Phosphatase: 52 U/L (ref 38–126)
Anion gap: 9 (ref 5–15)
BUN: 21 mg/dL (ref 8–23)
CO2: 30 mmol/L (ref 22–32)
Calcium: 8.5 mg/dL — ABNORMAL LOW (ref 8.9–10.3)
Chloride: 97 mmol/L — ABNORMAL LOW (ref 98–111)
Creatinine, Ser: 0.98 mg/dL (ref 0.61–1.24)
GFR, Estimated: >60 mL/min (ref >60)
Glucose, Bld: 121 mg/dL — ABNORMAL HIGH (ref 70–99)
Potassium: 4.2 mmol/L (ref 3.5–5.1)
Sodium: 136 mmol/L (ref 135–145)
Total Bilirubin: 1.1 mg/dL (ref 0.3–1.2)
Total Protein: 7 g/dL (ref 6.5–8.1)

## 2021-03-26 LAB — CBC WITH DIFFERENTIAL/PLATELET
Abs Immature Granulocytes: 0.07 10*3/uL (ref 0.00–0.07)
Basophils Absolute: 0 10*3/uL (ref 0.0–0.1)
Basophils Relative: 1 %
Eosinophils Absolute: 0 10*3/uL (ref 0.0–0.5)
Eosinophils Relative: 1 %
HCT: 48.4 % (ref 39.0–52.0)
Hemoglobin: 15.4 g/dL (ref 13.0–17.0)
Immature Granulocytes: 3 %
Lymphocytes Relative: 20 %
Lymphs Abs: 0.4 10*3/uL — ABNORMAL LOW (ref 0.7–4.0)
MCH: 28.6 pg (ref 26.0–34.0)
MCHC: 31.8 g/dL (ref 30.0–36.0)
MCV: 90 fL (ref 80.0–100.0)
Monocytes Absolute: 0.3 10*3/uL (ref 0.1–1.0)
Monocytes Relative: 14 %
Neutro Abs: 1.3 10*3/uL — ABNORMAL LOW (ref 1.7–7.7)
Neutrophils Relative %: 61 %
Platelets: 279 10*3/uL (ref 150–400)
RBC: 5.38 MIL/uL (ref 4.22–5.81)
RDW: 14.3 % (ref 11.5–15.5)
WBC: 2.1 10*3/uL — ABNORMAL LOW (ref 4.0–10.5)
nRBC: 0 % (ref 0.0–0.2)

## 2021-03-26 NOTE — Progress Notes (Signed)
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  At that time, he had diffuse thickening and hyperemia of the distal rectum concerning for potential tumor.  Hyperemia may also suggest infection/proctitis.  This was followed by colonoscopy on 01/06/2021 which showed a near obstructing mass in the rectum, 1 cm from the anal verge.  Scope could not be transversed down the mass.  Biopsy was taken and was positive for adenocarcinoma. Patient presented to medical oncology, Dr. Janese Banks, and reported that he was not able to move his bowels consistently with alternating constipation and diarrhea.  Concerns for lack of insight into his medical problems.  Endorsed rectal discomfort but no significant pain. He underwent a rectal biopsy which showed adenocarcinoma, poorly differentiated.  Tumor was positive for PSA and PSAP but negative for CK7, CK20, and CDX2.  Findings consistent with adenocarcinoma of prostate origin. He initiated [place].  PSA has been decreasing.  He completed final radiation treatment on 03/11/2021.  Plan to start oral androgen therapy and switch to Lupron upcoming.   Oncology History   No history exists.    Interval history-patient is 87 year old male with above history of prostate cancer, copd, ckd stage 2, htn, bph, who presents to symptom management clinic for complaints of weakness. He  completed radiation on 03/11/21.  He was seen in symptom management last week for similar complaints.  Orders were placed for home health, evaluation with OT, nutrition, and palliative care. Home health comes out next week. Appetite is poor which he says is related to thick saliva and esophageal secretions. He drinks well. No diziness or falls. Shoulder pain has improved. No easy bleeding or bruising. Denies chest pain or shortness of breath. No nausea, vomiting, constipation, or diarrhea. No urinary complaints.  Also ocmplains of tremor of right hand. He was seen by neurology at [facility] and started on amitriptyline and gabapentin. He is unsure if he's been taking those medications.   Review of systems- Review of Systems  Constitutional: Positive for malaise/fatigue and weight loss. Negative for chills and fever.  HENT: Negative for hearing loss, nosebleeds, sore throat and tinnitus.   Eyes: Negative for blurred vision and double vision.  Respiratory: Negative for cough, hemoptysis, shortness of breath and wheezing.   Cardiovascular: Negative for chest pain, palpitations and leg swelling.  Gastrointestinal: Negative for abdominal pain, blood in stool, constipation, diarrhea, melena, nausea and vomiting.  Genitourinary: Negative for dysuria and urgency.  Musculoskeletal: Negative for back pain, falls, joint pain and myalgias.  Skin: Negative for itching and rash.  Neurological: Negative for dizziness, tingling, sensory change, loss of consciousness, weakness and headaches.  Endo/Heme/Allergies: Negative for environmental allergies. Does not bruise/bleed easily.  Psychiatric/Behavioral: Negative for depression. The patient is not nervous/anxious and does not have insomnia.      No Known Allergies  Past Medical History:  Diagnosis Date  . BPH (benign prostatic hyperplasia)   . Cancer ([place])   . Chronic constipation 06/25/2015  . COPD (chronic obstructive  pulmonary disease) ([place])    pt denies.  .  Family history of breast cancer   . Family history of prostate cancer   . Hypertension   . Swelling of lower extremity    bilateral  . Wears dentures    full upper and lower - do not fit well.    Past Surgical History:  Procedure Laterality Date  . CATARACT EXTRACTION W/PHACO Right 09/30/2015   Procedure: CATARACT EXTRACTION PHACO AND INTRAOCULAR LENS PLACEMENT ([place]);  Surgeon: Leandrew Koyanagi, MD;  Location: [place];  Service: Ophthalmology;  Laterality: Right;  . COLONOSCOPY  2009?  . COLONOSCOPY WITH PROPOFOL N/A 01/06/2021   Procedure: COLONOSCOPY WITH PROPOFOL;  Surgeon: Lin Landsman, MD;  [facility] ENDOSCOPY;  Service: Gastroenterology;  Laterality: N/A;  . HERNIA REPAIR Right    x2     Social History   Socioeconomic History  . Marital status: Married    Spouse name: Not on file  . Number of children: Not on file  . Years of education: Not on file  . Highest education level: Not on file  Occupational History  . Not on file  Tobacco Use  . Smoking status: Current Every Day Smoker    Packs/day: 0.25    Years: 45.00    Pack years: 11.25    Types: Cigarettes  . Smokeless tobacco: Never Used  Vaping Use  . Vaping Use: Never used  Substance and Sexual Activity  . Alcohol use: Yes    Comment: drink beer maybe 2 - 3 weeks  . Drug use: No  . Sexual activity: Not on file  Other Topics Concern  . Not on file  Social History Narrative  . Not on file   Social Determinants of Health   Financial Resource Strain: Not on file  Food Insecurity: Not on file  Transportation Needs: Not on file  Physical Activity: Not on file  Stress: Not on file  Social Connections: Not on file  Intimate Partner Violence: Not on file    Family History  Problem Relation Age of Onset  . Prostate cancer Brother   . Alzheimer's disease Mother   . Breast cancer Mother        dx 2 or 68  . Stroke Father   . Heart attack Daughter   . Kidney cancer Neg Hx   .  Kidney disease Neg Hx   . Bladder Cancer Neg Hx      Current Outpatient Medications:  .  acetaminophen (TYLENOL) 500 MG tablet, Take 500 mg by mouth every 4 (four) hours as needed. , Disp: , Rfl:  .  amitriptyline (ELAVIL) 10 MG tablet, 10 mg at bedtime., Disp: , Rfl:  .  amLODipine (NORVASC) 10 MG tablet, Take 10 mg by mouth daily. PM, Disp: , Rfl:  .  aspirin 81 MG tablet, Take 81 mg by mouth daily. PM, Disp: , Rfl:  .  cetirizine (ZYRTEC) 10 MG tablet, Take by mouth., Disp: , Rfl:  .  cyanocobalamin 500 MCG tablet, Take 500 mcg by mouth daily. PM, Disp: , Rfl:  .  cyclobenzaprine (FLEXERIL) 5 MG tablet, Take by mouth., Disp: , Rfl:  .  finasteride (PROSCAR) 5 MG tablet, TAKE 1 TABLET BY MOUTH EVERY DAY, Disp: 90 tablet, Rfl: 3 .  hydrochlorothiazide (HYDRODIURIL) 25 MG tablet, Take 25 mg by mouth daily. PM, Disp: , Rfl:  .  metoprolol succinate (TOPROL-XL) 50 MG 24 hr tablet, Take 50 mg by mouth daily., Disp: , Rfl:  .  nortriptyline (PAMELOR) 10 MG capsule, Take 10 mg by mouth at bedtime., Disp: , Rfl:  .  polyethylene glycol powder (GLYCOLAX/MIRALAX) powder, TAKE 17G ONCE DAILY AS NEEDED FOR CONSTIPATION (ADJUST AMOUNT AS NEEDED) MIX IN 4-8 OZ OF FLUID, Disp: , Rfl:  .  Propylene Glycol (SYSTANE BALANCE) 0.6 % SOLN, INSTILL 3 5 TIMES A DAY IN EACH EYE AS NEEDED FOR DRYNESS., Disp: , Rfl:  .  tamsulosin (FLOMAX) 0.4 MG CAPS capsule, TAKE 1 CAPSULE BY MOUTH EVERY DAY, Disp: 90 capsule, Rfl: 3  Physical exam:  Vitals:   03/26/21 1416  BP: 117/83  Pulse: 98  Resp: 20  Temp: 97.8 F (36.6 C)  SpO2: 100%  Weight: 127 lb 9.6 oz (57.9 kg)   Physical Exam Constitutional:      General: He is not in acute distress.    Appearance: He is well-developed.     Comments: Thin build  HENT:     Head: Normocephalic and atraumatic.     Nose: Nose normal.     Mouth/Throat:     Pharynx: No oropharyngeal exudate.  Eyes:     General: No scleral icterus.    Conjunctiva/sclera: Conjunctivae  normal.  Cardiovascular:     Rate and Rhythm: Normal rate and regular rhythm.     Heart sounds: Normal heart sounds.  Pulmonary:     Effort: Pulmonary effort is normal.     Breath sounds: Normal breath sounds. No wheezing.  Abdominal:     General: Bowel sounds are normal. There is no distension.     Palpations: Abdomen is soft.     Tenderness: There is no abdominal tenderness.  Musculoskeletal:        General: Normal range of motion.     Cervical back: Normal range of motion and neck supple.  Skin:    General: Skin is warm and dry.  Neurological:     General: No focal deficit present.     Mental Status: He is alert and oriented to person, place, and time.  Psychiatric:        Behavior: Behavior normal.      CMP Latest Ref Rng & Units 03/19/2021  Glucose 70 - 99 mg/dL 104(H)  BUN 8 - 23 mg/dL 25(H)  Creatinine 0.61 - 1.24 mg/dL 1.11  Sodium 135 - 145 mmol/L 135  Potassium 3.5 - 5.1 mmol/L 3.9  Chloride 98 - 111 mmol/L 96(L)  CO2 22 - 32 mmol/L 27  Calcium 8.9 - 10.3 mg/dL 8.7(L)  Total Protein 6.5 - 8.1 g/dL 7.0  Total Bilirubin 0.3 - 1.2 mg/dL 1.2  Alkaline Phos 38 - 126 U/L 51  AST 15 - 41 U/L 46(H)  ALT 0 - 44 U/L 20   CBC Latest Ref Rng & Units 03/26/2021  WBC 4.0 - 10.5 K/uL 2.1(L)  Hemoglobin 13.0 - 17.0 g/dL 15.4  Hematocrit 39.0 - 52.0 % 48.4  Platelets 150 - 400 K/uL 279    No images are attached to the encounter.  No results found.  Assessment and plan- Patient is a 87 y.o. male diagnosed with metastatic prostate cancer who presents to [facility]om Management [facility] for weakness.  1. Weakness- labs checked today, cbc, cmp. Symptoms likely secondary to deconditioning from cancer and undergoing cancer treatments and weight loss. Agree with referrals to PT, OT. Will reach out to social work to assist patient with getting home cleaning services, food and/or meals on wheels. Continue ensure and follow up with Jennet Maduro, RD. Patient would also likely benefit  from  oncology based palliative care and community based palliative care.   2. Falls - mechanical fall. Initially painful shoulder, now resolved. Suspect weakness contributing. PT and OT pending. Safety reviewed.   3. Tremor- continue follow up with neurology  4. Weight loss- suspect thick secretions related to dehydration. Encouraged hydration. Patient declines iv fluids today. Recommend remeron 7.5 mg nightly for appetite stimulation. If dizziness worsens however, discontinue. Patient provided with food from food pantry today.    Visit Diagnosis 1. Convalescence after radiotherapy   2. Weakness     Patient expressed understanding and was in agreement with this plan. He also understands that He can call clinic at any time with any questions, concerns, or complaints.   Thank you for allowing me to participate in the care of this very pleasant patient.   Beckey Rutter, DNP, AGNP-C [place] at [place]

## 2021-03-26 NOTE — Progress Notes (Signed)
No fluids today per Beckey Rutter, NP.

## 2021-03-26 NOTE — Progress Notes (Signed)
Patient states home health will becoming out next week. Patient states he has fell 3 or 4 times due to the fact of him needing help cleaning his home. Patient states he needs medication to help his appetite. Patient states he does not have an appetite. Patient complains of he slobbering 24/7. Patient states his mouth gets heavy for all the saliva and sometimes get choked up.

## 2021-03-30 ENCOUNTER — Telehealth: Payer: Self-pay | Admitting: Adult Health Nurse Practitioner

## 2021-03-30 NOTE — Telephone Encounter (Signed)
Called patient's son, [name], to offer to schedule a Palliative Consult, no answer - left message with reason for call along with my name and call back number requesting a return call.

## 2021-03-30 NOTE — Progress Notes (Signed)
Patient reported that he needed someone to help clean his home.  The [employer] at [facility] has a fund that helps with this need.  PSN mailed the consent form to patient today, with a note for him to sign and return in the included stamped envelope with cancer center address.  PSN also left him a message on his phone that the form was being mailed to him.

## 2021-04-02 ENCOUNTER — Inpatient Hospital Stay: Payer: Medicare Other | Admitting: Oncology

## 2021-04-02 ENCOUNTER — Telehealth: Payer: Self-pay | Admitting: Oncology

## 2021-04-02 ENCOUNTER — Inpatient Hospital Stay: Payer: Medicare Other

## 2021-04-02 NOTE — Telephone Encounter (Signed)
Attempt made to contact patient to reschedule today's missed appointment. Mailbox full.

## 2021-04-06 ENCOUNTER — Telehealth: Payer: Self-pay | Admitting: Licensed Clinical Social Worker

## 2021-04-07 ENCOUNTER — Telehealth: Payer: Self-pay | Admitting: Adult Health Nurse Practitioner

## 2021-04-07 ENCOUNTER — Inpatient Hospital Stay: Payer: Medicare Other | Admitting: Occupational Therapy

## 2021-04-07 DIAGNOSIS — R531 Weakness: Secondary | ICD-10-CM

## 2021-04-07 NOTE — Progress Notes (Signed)
Nutrition  Provided patient with case of ensure plus today and bag of food from food pantry.   Nelida Mandarino B. Zenia Resides, [place], [place] Registered Dietitian [phone] (mobile)

## 2021-04-07 NOTE — Telephone Encounter (Signed)
Rec'd call from Altha Harm, NP at the [facility] stating that the patient was there for an appointment and he wanted to schedule a visit with [place].  He also stated that he would like our SW to go on the visit as well if possible.  I offered appointment for 04/13/21 @ 3:30 PM and he requested something sooner.  I scheduled the Palliative Consult for 04/08/21 @ 3:30 PM.  Will notify Palliative NP and SW.

## 2021-04-07 NOTE — Therapy (Signed)
[facility] [address] [place], [place], [place], [address] Phone: [phone]   Fax:  [phone]  Occupational Therapy Evaluation  Patient Details  Name: Jacob [name] MRN: [id number] Date of Birth: [birth year] No data recorded  Encounter Date: 04/07/2021   OT End of Session - 04/07/21 1754    Visit Number 0           Past Medical History:  Diagnosis Date  . BPH (benign prostatic hyperplasia)   . Cancer ([place])   . Chronic constipation 06/25/2015  . COPD (chronic obstructive pulmonary disease) ([place])    pt denies.  . Family history of breast cancer   . Family history of prostate cancer   . Hypertension   . Swelling of lower extremity    bilateral  . Wears dentures    full upper and lower - do not fit well.    Past Surgical History:  Procedure Laterality Date  . CATARACT EXTRACTION W/PHACO Right 09/30/2015   Procedure: CATARACT EXTRACTION PHACO AND INTRAOCULAR LENS PLACEMENT ([place]);  Surgeon: Leandrew Koyanagi, MD;  Location: [place];  Service: Ophthalmology;  Laterality: Right;  . COLONOSCOPY  2009?  . COLONOSCOPY WITH PROPOFOL N/A 01/06/2021   Procedure: COLONOSCOPY WITH PROPOFOL;  Surgeon: Lin Landsman, MD;  Location[facility] ENDOSCOPY;  Service: Gastroenterology;  Laterality: N/A;  . HERNIA REPAIR Right    x2     There were no vitals filed for this visit.   Subjective Assessment - 04/07/21 1752    Subjective  I fell 5 x  - last one was last week - one time was out of the bed- and I hurt my R shoulder - don't think anything is broken - but I need a casemanager -and help at home- and Medicaid- I cannot take care of the house anymore - they bring me food 5 days a week - but the other days I am not really hungery -and kids not checking on me every day -maybe once week or 2 wks    Currently in Pain? 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  At that time, he had diffuse thickening and hyperemia of the distal rectum concerning for potential tumor.  Hyperemia may also suggest infection/proctitis.  This was followed by colonoscopy on 01/06/2021 which showed a near obstructing mass in the rectum, 1 cm from the anal verge.  Scope could not be transversed down the mass.  Biopsy was taken and was positive for adenocarcinoma. Patient presented to medical oncology, Dr. Janese Banks, and reported that he was not able to move his bowels consistently with alternating constipation and diarrhea.  Concerns for lack of insight into his medical problems.  Endorsed rectal discomfort but no significant pain. He underwent a rectal biopsy which showed adenocarcinoma, poorly differentiated.  Tumor was positive for PSA and PSAP but negative for CK7, CK20, and CDX2.  Findings consistent with adenocarcinoma of prostate origin. He initiated  [place].  PSA has been decreasing.  He completed final radiation treatment on 03/11/2021.  Plan to start oral androgen therapy and switch to Lupron upcoming.      Oncology History   No history exists.    Interval history-patient is 87 year old male with above history of prostate cancer who presents to symptom management clinic for complaints of weakness.  He was seen in symptom management last week for similar complaints.  Orders were placed for home health, evaluation with OT, nutrition, and palliative care.  Denies any neurologic complaints. Denies recent fevers or illnesses. Denies any  easy bleeding or bruising. Reports good appetite and denies weight loss. Denies chest pain. Denies any nausea, vomiting, constipation, or diarrhea. Denies urinary complaints. Patient offers no further specific complaints today.      OT SCREEN 02/10/21:  Pt this date report coming into rehab session that he cannot make enough food for him and stays hungry and cannot drive at night time- because of vision. Also so tired and weak - just cannot do things anymore - like cleaning , cooking and doing dishes. Has 2 daughters but they do not help - Son lives in [place]- come in beginning with him to appointment -but he does not see him a lot. Pt drive himself to appointments. Pt denies falls ,and report had tub shower combo - but shower chair he had to give away - wife use to use it - she is for the last 6 yrs  in nursing home. Discuss with Sherri - Dr Elroy Channel nurse this date pt situation - he still have radiation appt until about 23rd of March - she contacted transportation to set up Lucianne Lei for pt. Also contacted [employer]als [employer] wheels -and they can start provide pt one warm meal a day early next week. Discuss with pt, to talk to son to maybe brings him on weekend some meals for couple of days -and then stop at drive thru and get few things for the other days - and things that is easy to fix. Pt BERG balance test 42/50 - low risk for falling  But would recommend maybe referral to Palliative Care and [place] 04/07/21:  Pt this date in w/c and reporting he fell 5 x last week the last one -and one of them was out of bed- he trips and fell on his R shoulder - some pain - but 2-3/10 = but no increase pain with AROM - but pt weak and only raise arms to about 90 degrees- no pain increase with full PROM  Some tenderness Pt report coming into rehab session that he did not get any calls from somebody to come help him at home- " that he wants case manager to help get him some help and Medicaid" He report  he cannot make enough food for him , clean house, laundry - cannot drive at night time- because of vision. Also so tired and weak - just cannot do things anymore  Forgot stove on when using it  Has 2 daughters but they do not help - Son lives in [place]- come in beginning with him to appointment -but he does not see him a lot. Pt drive himself to appointments. Pt report washing at sink most of the time- if he feels good the day - try and get in shower - do have chair now- pt has  tub shower combo - wife is for the last 6 yrs  in nursing home. Discuss  with Joshue Borders NP , [employer] and Kristi navigator- pt's case while pt in clinic today  Food was provided for him and [facility] was contacted - short staff - not able to get to pt  Joshue contacted [place] -and home appt was setup for tomorrow afternoon and pt was provided info Pt missed last appt with Dr Janese Banks - info provided for pt - appt next Tuesday the 3rd May Thank you for palliative care follow up setup for tomorrow - recommend PT/OT in home setting                                     Patient will benefit from skilled therapeutic intervention in order to improve the following deficits and impairments:           Visit Diagnosis: Weakness    Problem List Patient Active Problem List   Diagnosis Date Noted  . Family history of prostate cancer   . Family history of breast cancer   . Goals of care, counseling/discussion 01/18/2021  . Prostate cancer ([place]) 01/14/2021  . Vitiligo 10/04/2019  . Vitamin D deficiency 12/28/2018  . Right inguinal hernia 06/21/2018  . Chronic kidney insufficiency, stage 2 (mild) 02/02/2018  . Chronic bronchitis, simple ([place]) 12/28/2016  . High risk medication use 12/24/2015  . Cigarette smoker 12/24/2015  . Benign essential hypertension 06/24/2014  . Benign prostatic hyperplasia 06/24/2014  . Back pain 06/24/2014  . B12 deficiency 06/24/2014     Rosalyn Gess OTR/L,CLT 04/07/2021, 5:54 PM  [facility] [address] [place], [place], [place], [address] Phone: [phone]   Fax:  [phone]  Name: Jacob [name] MRN: [id number] Date of Birth: Mar 28, 1934

## 2021-04-08 ENCOUNTER — Encounter: Payer: Self-pay | Admitting: Adult Health Nurse Practitioner

## 2021-04-08 ENCOUNTER — Other Ambulatory Visit: Payer: Medicare Other

## 2021-04-08 ENCOUNTER — Other Ambulatory Visit: Payer: Medicare Other | Admitting: Adult Health Nurse Practitioner

## 2021-04-08 ENCOUNTER — Other Ambulatory Visit: Payer: Self-pay

## 2021-04-08 DIAGNOSIS — C61 Malignant neoplasm of prostate: Secondary | ICD-10-CM

## 2021-04-08 DIAGNOSIS — Z515 Encounter for palliative care: Secondary | ICD-10-CM

## 2021-04-08 DIAGNOSIS — R630 Anorexia: Secondary | ICD-10-CM

## 2021-04-08 DIAGNOSIS — R296 Repeated falls: Secondary | ICD-10-CM

## 2021-04-08 DIAGNOSIS — R251 Tremor, unspecified: Secondary | ICD-10-CM

## 2021-04-08 DIAGNOSIS — R42 Dizziness and giddiness: Secondary | ICD-10-CM

## 2021-04-08 NOTE — Progress Notes (Signed)
[facility] Palliative Care Consult Note Telephone: [phone]  Fax: [phone]    Date of encounter: 04/08/21 PATIENT NAME: Jacob [name] Jacob [name]   [id number] (home)  DOB: Nov 19, 1934 MRN: [id number] PRIMARY CARE PROVIDER:    [name], MD,  Jacob [name] [place] [address] [phone]  REFERRING PROVIDER:   Dr. Billey [name]  RESPONSIBLE PARTY:    Contact Information    Name Relation Home Work Mobile   Edna, Jacob [name]   [phone]   Sheppard, Jacob [name] Spouse   [phone]       I met face to face with patient in home. Palliative Care was asked to follow this patient by consultation request of  Dr. Billey [name] to address advance care planning and complex medical decision making. This is the initial visit.   This was a joint visit with SW, Jacob [name].                                    ASSESSMENT AND PLAN / RECOMMENDATIONS:   Advance Care Planning/Goals of Care: Goals include to maximize quality of life and symptom management. Patient endorses that he wants everything done to prolong life including CPR and life support  CODE STATUS: full code  Symptom Management/Plan:  Prostate cancer: Patient being followed by oncology and currently on Lupron.  Has mets to the bone but denies pain at this time.  Continue follow-up and recommendations by oncology  Tremor: PCP has put in referral for neurology.  Patient encouraged to set up appointment and to keep this appointment with neurology office calls.  Frequent falls: Appears that physical therapy is being set up through oncology.  We will confirm this with oncologist.  Did speak with Jacob Chang NP at cancer center and states that there were some issues with staffing through the home health agency.  We will have our clinical navigator help with locating home health agency if needed.  Dizziness: Unsure where dizziness is coming from.   Patient does state that he does not drink enough water and have encouraged him to drink plenty of fluids to stay hydrated as this can increase dizziness.  As above patient does have referral to neurology and encouraged to set up and keep this appointment.  Poor appetite: He does supplement with ensures.  Was ordered Remeron 7.5 mg nightly by PCP.  States he has not picked this up and have encouraged him to pick up this medication.  Support: Education officer, museum is assisting patient with Medicaid application to see if he qualifies for food stamps.  Patient states that someone at oncology office told him that they can get him someone to come clean his home for free confirm that this is not available through the cancer center.  Social worker to continue helping patient with resources.  Was encouraged to ask family for assistance in the home.  Patient states having 4 sons and 1 daughter.  His wife is a long-term resident at [facility].   Follow up Palliative Care Visit: Palliative care will continue to follow for complex medical decision making, advance care planning, and clarification of goals. Return 4 weeks or prn. Encouraged to call with any questions or concerns  I spent 80 minutes providing this consultation. More than 50% of the time in this consultation was spent in counseling and care coordination.  PPS: 60%  HOSPICE ELIGIBILITY/DIAGNOSIS: TBD  Chief Complaint: initial palliative visit  HISTORY OF PRESENT ILLNESS:  Jacob [name] is a 87 y.o. year old male  with prostate cancer, COPD, CKD stage 2, HTN, BPH, vitamin D deficiency, vitamin B12 deficiency.  Patient lives alone in a house that he rents.  Patient had ED visit on 10/08/2020 due to acute GI bleeding and CT showed proctitis versus rectal mass.  Patient was in ED on 01/01/2021 for diarrhea over a month.  On 01/06/2021 patient had colonoscopy which showed obstructing mass in the rectum and biopsy was positive for adenocarcinoma.  Patient  states that he has had 25 rounds of radiation treatment.  Patient is unclear about what kind of treatment he is getting now.  Chart review does show that he is getting Lupron and does get dexamethasone injections.  Patient has had some weight loss.  Appears baseline weight is 136 pounds and patient currently weighs 127 pounds.  Patient states that his appetite is poor and he is supplementing with Ensure's.  Was started on Remeron this week and states that he has not picked this up yet.  Patient states that the radiation treatments did give him diarrhea which he feels is much improved now.  Patient states having slight amount of blood in stools occasionally.  Patient states that he does have occasional urinary incontinence.  Patient states that he has had shaking/tremors for about a year now.  States that the radiation treatments zapped him of his strength and is having increased fatigue and dizziness.  Patient states that he has had 5 falls recently with 3 of them falling out of bed.  Patient states that about 2 months ago home was sprayed for bedbugs and he threw out his mattress.  He does have what looks like twin size box springs with a full size mattress on top and believe this is why he is falling out of bed.  History obtained from review of EMR and interview with [name].  I reviewed available labs, medications, imaging, studies and related documents from the EMR.  Records reviewed and summarized above.    Physical Exam:  Constitutional: NAD General: frail appearing, thin EYES: anicteric sclera, lids intact, no discharge  ENMT: intact hearing, oral mucous membranes moist CV:  no LE edema Pulmonary:  no increased work of breathing, no cough MSK:  moves all extremities, ambulatory--uses cane at times Skin:  no rashes or wounds on visible skin Neuro:  A and O x 3; states being forgetful   CURRENT PROBLEM LIST:  Patient Active Problem List   Diagnosis Date Noted  . Family history of prostate  cancer   . Family history of breast cancer   . Goals of care, counseling/discussion 01/18/2021  . Prostate cancer ([facility]) 01/14/2021  . Vitiligo 10/04/2019  . Vitamin D deficiency 12/28/2018  . Right inguinal hernia 06/21/2018  . Chronic kidney insufficiency, stage 2 (mild) 02/02/2018  . Chronic bronchitis, simple ([facility]) 12/28/2016  . High risk medication use 12/24/2015  . Cigarette smoker 12/24/2015  . Benign essential hypertension 06/24/2014  . Benign prostatic hyperplasia 06/24/2014  . Back pain 06/24/2014  . B12 deficiency 06/24/2014   PAST MEDICAL HISTORY:  Active Ambulatory Problems    Diagnosis Date Noted  . High risk medication use 12/24/2015  . Benign essential hypertension 06/24/2014  . Cigarette smoker 12/24/2015  . Chronic bronchitis, simple ([facility]) 12/28/2016  . Benign prostatic hyperplasia 06/24/2014  . Back pain 06/24/2014  . B12 deficiency 06/24/2014  . Chronic  kidney insufficiency, stage 2 (mild) 02/02/2018  . Right inguinal hernia 06/21/2018  . Vitamin D deficiency 12/28/2018  . Vitiligo 10/04/2019  . Prostate cancer ([facility]) 01/14/2021  . Goals of care, counseling/discussion 01/18/2021  . Family history of prostate cancer   . Family history of breast cancer    Resolved Ambulatory Problems    Diagnosis Date Noted  . Chronic constipation 06/25/2015   Past Medical History:  Diagnosis Date  . BPH (benign prostatic hyperplasia)   . Cancer ([facility])   . COPD (chronic obstructive pulmonary disease) ([facility])   . Hypertension   . Swelling of lower extremity   . Wears dentures    SOCIAL HX:  Social History   Tobacco Use  . Smoking status: Current Every Day Smoker    Packs/day: 0.25    Years: 45.00    Pack years: 11.25    Types: Cigarettes  . Smokeless tobacco: Never Used  Substance Use Topics  . Alcohol use: Yes    Comment: drink beer maybe 2 - 3 weeks   FAMILY HX:  Family History  Problem Relation Age of Onset  . Prostate cancer Brother   . Alzheimer's  disease Mother   . Breast cancer Mother        dx 49 or 74  . Stroke Father   . Heart attack Daughter   . Kidney cancer Neg Hx   . Kidney disease Neg Hx   . Bladder Cancer Neg Hx       ALLERGIES: No Known Allergies   PERTINENT MEDICATIONS:  Outpatient Encounter Medications as of 04/08/2021  Medication Sig  . acetaminophen (TYLENOL) 500 MG tablet Take 500 mg by mouth every 4 (four) hours as needed.   Marland Kitchen amitriptyline (ELAVIL) 10 MG tablet 10 mg at bedtime.  Marland Kitchen amLODipine (NORVASC) 10 MG tablet Take 10 mg by mouth daily. PM  . aspirin 81 MG tablet Take 81 mg by mouth daily. PM  . cetirizine (ZYRTEC) 10 MG tablet Take by mouth.  . cyanocobalamin 500 MCG tablet Take 500 mcg by mouth daily. PM  . cyclobenzaprine (FLEXERIL) 5 MG tablet Take by mouth.  . finasteride (PROSCAR) 5 MG tablet TAKE 1 TABLET BY MOUTH EVERY DAY  . hydrochlorothiazide (HYDRODIURIL) 25 MG tablet Take 25 mg by mouth daily. PM  . metoprolol succinate (TOPROL-XL) 50 MG 24 hr tablet Take 50 mg by mouth daily.  . nortriptyline (PAMELOR) 10 MG capsule Take 10 mg by mouth at bedtime.  . polyethylene glycol powder (GLYCOLAX/MIRALAX) powder TAKE 17G ONCE DAILY AS NEEDED FOR CONSTIPATION (ADJUST AMOUNT AS NEEDED) MIX IN 4-8 OZ OF FLUID  . Propylene Glycol (SYSTANE BALANCE) 0.6 % SOLN INSTILL 3 5 TIMES A DAY IN EACH EYE AS NEEDED FOR DRYNESS.  . tamsulosin (FLOMAX) 0.4 MG CAPS capsule TAKE 1 CAPSULE BY MOUTH EVERY DAY   No facility-administered encounter medications on file as of 04/08/2021.    Thank you for the opportunity to participate in the care of Mr. Maberry.  The palliative care team will continue to follow. Please call our office at [phone] if we can be of additional assistance.   Juandiego Kolenovic Jenetta Downer, NP , DNP  This chart was dictated using voice recognition software. Despite best efforts to proofread, errors can occur which can change the documentation meaning.   COVID-19 PATIENT SCREENING TOOL Asked and negative  response unless otherwise noted:   Have you had symptoms of covid, tested positive or been in contact with someone with symptoms/positive test in the  past 5-10 days? negative

## 2021-04-09 NOTE — Progress Notes (Signed)
COMMUNITY PALLIATIVE CARE SW NOTE  PATIENT NAME: Jacob [name] DOB: 11-Jul-1934 MRN: [id number]  PRIMARY CARE PROVIDER: [name], MD  RESPONSIBLE PARTY:  Acct ID - Guarantor Home Phone Work Phone Relationship Acct Type  [id number] Jacob [name], [phone]  Self P/F     [address], [place], [place] [address]     PLAN OF CARE and INTERVENTIONS:             1. GOALS OF CARE/ ADVANCE CARE PLANNING:  Patient is currently a FULL CODE. ACP discussion to continue. Goal is to remain at home and be as independent as possible.   2.         SOCIAL/EMOTIONAL/SPIRITUAL ASSESSMENT/ INTERVENTIONS:  SW conducted initial joint visit with palliative care NP 04/08/21 . PC met with patient in his home. Patient resides in a one story home alone.  Patient provided overview of medical hx.   Patient suffers from prostate cancer and recently completed 25 sessions of chemo. Patient shares that he feels extremely weak and unable to do anything, such as clean his home or cook meals. Patient has tremors and constant shaking of his R hand as well as drooling. Patient has a neurology consult in place and will be outreached with a neuro appt soon. Patient shares that his main concern is his weakness and needing his home cleaned. Patient stated that he was told that someone would be able to come and clean his home for free.  Patient shared that his home recently had bed bugs and he had to rid of most of his furniture and bed. He shared that someone is suppose to return next week to spray his home. Patient currently has 2 chairs in his living room and a bed in his room, the bed appears to be 2 twin size box spring stacked on top of each other with a full size mattress laid on top of them, the mattress is to large for the box springs. Patient shares that he has fell out of bed recently, more than likely due to the bed set up. Patient shared that he has fallen many times as a result of his weakness and dizziness. Patient  encouraged to utilize assistive devise for mobility due to increased fall risk. Patient has a cane and RW. Patient acknowledged the need to utilize them.  Patient reports that he is sleeping is okay. Patient stated that he does not eat much due to not having an appetite. He drinks ensure. Patient encouraged to increase fluid intake. Patient states that he does not cook due to not having the strength to do so, but does use the microwave. He states that he does not have food insecurities at this time and can drive to the grocery store.   SW discussed care and goals. Patient shared that he would like to have someone come in and help clean his home. He would also like to apply for Medicaid and food stamps if possible. SW started [facility] application with patient and mail to local DSS. Patient desires to remain at home with hopes of regaining his strength. NP to follow up on possible PT consult.   Palliative care will continue to follow.   3.         PATIENT/CAREGIVER EDUCATION/ COPING: Patient is alert, engaged in conversation. Patient openly expresses feelings with SW and NP. Patients wife is a LTC patient at [facility]. Patient would go visit her daily, but has not had the strength to do so lately.  Patient denies anxiety or depression. Patient has 3 children, whom shares ae not as supportive as they should be. Patient shares that he smokes a pack of cigarettes a week.    4.         PERSONAL EMERGENCY PLAN: Patient will call 9-1-1 for emergencies.    5.         COMMUNITY RESOURCES COORDINATION/ HEALTH CARE NAVIGATION:  Patient manages care needs.    6.         FINANCIAL/LEGAL CONCERNS/INTERVENTIONS:  None. Medicaid application pending.     SOCIAL HX:  Social History   Tobacco Use  . Smoking status: Current Every Day Smoker    Packs/day: 0.25    Years: 45.00    Pack years: 11.25    Types: Cigarettes  . Smokeless tobacco: Never Used  Substance Use Topics  . Alcohol use: Yes     Comment: drink beer maybe 2 - 3 weeks    CODE STATUS: Full code ADVANCED DIRECTIVES: N MOST FORM COMPLETE:  N HOSPICE EDUCATION PROVIDED: N  PPS: Patient is independent with ADL's. Patient drives.       Doreene Eland, [place]

## 2021-04-12 ENCOUNTER — Ambulatory Visit: Payer: Self-pay | Admitting: Licensed Clinical Social Worker

## 2021-04-12 ENCOUNTER — Encounter: Payer: Self-pay | Admitting: Licensed Clinical Social Worker

## 2021-04-12 ENCOUNTER — Telehealth: Payer: Self-pay

## 2021-04-12 DIAGNOSIS — Z803 Family history of malignant neoplasm of breast: Secondary | ICD-10-CM

## 2021-04-12 DIAGNOSIS — Z8042 Family history of malignant neoplasm of prostate: Secondary | ICD-10-CM

## 2021-04-12 DIAGNOSIS — C61 Malignant neoplasm of prostate: Secondary | ICD-10-CM

## 2021-04-12 DIAGNOSIS — Z1379 Encounter for other screening for genetic and chromosomal anomalies: Secondary | ICD-10-CM | POA: Insufficient documentation

## 2021-04-12 NOTE — Telephone Encounter (Signed)
Completed medicaid application faxed to [employer].

## 2021-04-12 NOTE — Telephone Encounter (Signed)
Revealed negative genetic testing.  Revealed that a VUS in MSH2 was identified. This normal result is reassuring and indicates that it is unlikely Jacob [name] cancer is due to a hereditary cause.  It is unlikely that there is an increased risk of another cancer due to a mutation in one of these genes.  However, genetic testing is not perfect, and cannot definitively rule out a hereditary cause.  It will be important for him to keep in contact with genetics to learn if any additional testing may be needed in the future.

## 2021-04-12 NOTE — Progress Notes (Signed)
HPI:  Mr. Clack was previously seen in the [facility] clinic due to a personal and family history of cancer and concerns regarding a hereditary predisposition to cancer. Please refer to our prior cancer genetics clinic note for more information regarding our discussion, assessment and recommendations, at the time. Mr. Hansen recent genetic test results were disclosed to him, as were recommendations warranted by these results. These results and recommendations are discussed in more detail below.  CANCER HISTORY:  Oncology History   No history exists.    FAMILY HISTORY:  We obtained a detailed, 4-generation family history.  Significant diagnoses are listed below: Family History  Problem Relation Age of Onset  . Prostate cancer Brother   . Alzheimer's disease Mother   . Breast cancer Mother        dx 71 or 84  . Stroke Father   . Heart attack Daughter   . Kidney cancer Neg Hx   . Kidney disease Neg Hx   . Bladder Cancer Neg Hx     Mr. Portner had 4 sons, 1 daughter. His daughter passed of a heart attack. Mr. Bochicchio had 2 brothers, 2 sisters. One brother had prostate cancer that he passed from in his 67s.   Mr. Black mother had breast cancer at 62/40, she passed at 26 of dementia. Patient has limited info about history but is unaware of any other cancers on this side of the family. He is unaware of any cancer on paternal side, again limited information.   Mr. Warmuth is unaware of previous family history of genetic testing for hereditary cancer risks. There is no reported Ashkenazi Jewish ancestry. There is no known consanguinity.    GENETIC TEST RESULTS: Genetic testing reported out on 04/06/2021 through the [facility] Common Hereditary cancer panel found no pathogenic mutations.   The Common Hereditary Cancers Panel  offered by [facility] includes sequencing and/or deletion duplication testing of the following 47 genes: APC, ATM, AXIN2, BARD1, BMPR1A, BRCA1, BRCA2, BRIP1, CDH1,  CDKN2A (p14ARF), CDKN2A (p16INK4a), CKD4, CHEK2, CTNNA1, DICER1, EPCAM (Deletion/duplication testing only), GREM1 (promoter region deletion/duplication testing only), KIT, MEN1, MLH1, MSH2, MSH3, MSH6, MUTYH, NBN, NF1, NHTL1, PALB2, PDGFRA, PMS2, POLD1, POLE, PTEN, RAD50, RAD51C, RAD51D, SDHB, SDHC, SDHD, SMAD4, SMARCA4. STK11, TP53, TSC1, TSC2, and VHL.  The following genes were evaluated for sequence changes only: SDHA and HOXB13 c.251G>A variant only.  The test report has been scanned into EPIC and is located under the Molecular Pathology section of the Results Review tab.  A portion of the result report is included below for reference.     We discussed with Mr. Laforte that because current genetic testing is not perfect, it is possible there may be a gene mutation in one of these genes that current testing cannot detect, but that chance is small.  We also discussed, that there could be another gene that has not yet been discovered, or that we have not yet tested, that is responsible for the cancer diagnoses in the family. It is also possible there is a hereditary cause for the cancer in the family that Mr. Garciagarcia did not inherit and therefore was not identified in his testing.  Therefore, it is important to remain in touch with cancer genetics in the future so that we can continue to offer Mr. Hestand the most up to date genetic testing.   Genetic testing did identify a variant of uncertain significance (VUS) in the MSH2 gene.  At this time, it is unknown if  this variant is associated with increased cancer risk or if this is a normal finding, but most variants such as this get reclassified to being inconsequential. It should not be used to make medical management decisions. With time, we suspect the lab will determine the significance of this variant, if any. If we do learn more about it, we will try to contact Mr. Bollig to discuss it further. However, it is important to stay in touch with [place] periodically and  keep the address and phone number up to date.  ADDITIONAL GENETIC TESTING: We discussed with Mr. Tremaine that his genetic testing was fairly extensive.  If there are genes identified to increase cancer risk that can be analyzed in the future, we would be happy to discuss and coordinate this testing at that time.    CANCER SCREENING RECOMMENDATIONS: Mr. Shelnutt test result is considered negative (normal).  This means that we have not identified a hereditary cause for his  personal and family history of cancer at this time. Most cancers happen by chance and this negative test suggests that his cancer may fall into this category.    While reassuring, this does not definitively rule out a hereditary predisposition to cancer. It is still possible that there could be genetic mutations that are undetectable by current technology. There could be genetic mutations in genes that have not been tested or identified to increase cancer risk.  Therefore, it is recommended he continue to follow the cancer management and screening guidelines provided by his oncology and primary healthcare provider.   An individual's cancer risk and medical management are not determined by genetic test results alone. Overall cancer risk assessment incorporates additional factors, including personal medical history, family history, and any available genetic information that may result in a personalized plan for cancer prevention and surveillance.  RECOMMENDATIONS FOR FAMILY MEMBERS:  Relatives in this family might be at some increased risk of developing cancer, over the general population risk, simply due to the family history of cancer.  We recommended male relatives in this family have a yearly mammogram beginning at age 90, or 33 years younger than the earliest onset of cancer, an annual clinical breast exam, and perform monthly breast self-exams. Male relatives in this family should also have a gynecological exam as recommended by their  primary provider.  All family members should be referred for colonoscopy starting at age 69.   It is also possible there is a hereditary cause for the cancer in Mr. Macbride family that he did not inherit and therefore was not identified in him.  Based on Mr. Rainville family history, we recommended maternal relatives have genetic counseling and testing. Mr. On will let us know if we can be of any assistance in coordinating genetic counseling and/or testing for these family members.  FOLLOW-UP: Lastly, we discussed with Mr. Hearn that cancer genetics is a rapidly advancing field and it is possible that new genetic tests will be appropriate for him and/or his family members in the future. We encouraged him to remain in contact with cancer genetics on an annual basis so we can update his personal and family histories and let him know of advances in cancer genetics that may benefit this family.   Our contact number was provided. Mr. Carandang questions were answered to his satisfaction, and he knows he is welcome to call us at anytime with additional questions or concerns.   Faith Rogue, MS, [facility] Genetic Counselor [email or web address] Phone: [phone]

## 2021-04-13 ENCOUNTER — Other Ambulatory Visit: Payer: Self-pay

## 2021-04-13 ENCOUNTER — Inpatient Hospital Stay (HOSPITAL_BASED_OUTPATIENT_CLINIC_OR_DEPARTMENT_OTHER): Payer: Medicare Other | Admitting: Oncology

## 2021-04-13 ENCOUNTER — Encounter: Payer: Self-pay | Admitting: Oncology

## 2021-04-13 ENCOUNTER — Inpatient Hospital Stay: Payer: Medicare Other | Attending: Oncology

## 2021-04-13 ENCOUNTER — Inpatient Hospital Stay: Payer: Medicare Other

## 2021-04-13 VITALS — BP 129/92 | HR 71 | Temp 96.3°F | Wt 132.5 lb

## 2021-04-13 DIAGNOSIS — Z79818 Long term (current) use of other agents affecting estrogen receptors and estrogen levels: Secondary | ICD-10-CM | POA: Diagnosis not present

## 2021-04-13 DIAGNOSIS — F17210 Nicotine dependence, cigarettes, uncomplicated: Secondary | ICD-10-CM | POA: Diagnosis not present

## 2021-04-13 DIAGNOSIS — C61 Malignant neoplasm of prostate: Secondary | ICD-10-CM | POA: Diagnosis not present

## 2021-04-13 DIAGNOSIS — I129 Hypertensive chronic kidney disease with stage 1 through stage 4 chronic kidney disease, or unspecified chronic kidney disease: Secondary | ICD-10-CM | POA: Diagnosis not present

## 2021-04-13 DIAGNOSIS — N400 Enlarged prostate without lower urinary tract symptoms: Secondary | ICD-10-CM | POA: Diagnosis not present

## 2021-04-13 DIAGNOSIS — Z8042 Family history of malignant neoplasm of prostate: Secondary | ICD-10-CM | POA: Insufficient documentation

## 2021-04-13 DIAGNOSIS — Z5181 Encounter for therapeutic drug level monitoring: Secondary | ICD-10-CM

## 2021-04-13 DIAGNOSIS — N182 Chronic kidney disease, stage 2 (mild): Secondary | ICD-10-CM | POA: Insufficient documentation

## 2021-04-13 DIAGNOSIS — Z803 Family history of malignant neoplasm of breast: Secondary | ICD-10-CM | POA: Insufficient documentation

## 2021-04-13 LAB — CBC WITH DIFFERENTIAL/PLATELET
Abs Immature Granulocytes: 0.02 10*3/uL (ref 0.00–0.07)
Basophils Absolute: 0 10*3/uL (ref 0.0–0.1)
Basophils Relative: 0 %
Eosinophils Absolute: 0.1 10*3/uL (ref 0.0–0.5)
Eosinophils Relative: 3 %
HCT: 43.7 % (ref 39.0–52.0)
Hemoglobin: 13.9 g/dL (ref 13.0–17.0)
Immature Granulocytes: 1 %
Lymphocytes Relative: 17 %
Lymphs Abs: 0.6 10*3/uL — ABNORMAL LOW (ref 0.7–4.0)
MCH: 29.2 pg (ref 26.0–34.0)
MCHC: 31.8 g/dL (ref 30.0–36.0)
MCV: 91.8 fL (ref 80.0–100.0)
Monocytes Absolute: 0.5 10*3/uL (ref 0.1–1.0)
Monocytes Relative: 15 %
Neutro Abs: 2.1 10*3/uL (ref 1.7–7.7)
Neutrophils Relative %: 64 %
Platelets: 312 10*3/uL (ref 150–400)
RBC: 4.76 MIL/uL (ref 4.22–5.81)
RDW: 14.5 % (ref 11.5–15.5)
WBC: 3.3 10*3/uL — ABNORMAL LOW (ref 4.0–10.5)
nRBC: 0 % (ref 0.0–0.2)

## 2021-04-13 LAB — COMPREHENSIVE METABOLIC PANEL
ALT: 22 U/L (ref 0–44)
AST: 30 U/L (ref 15–41)
Albumin: 3.4 g/dL — ABNORMAL LOW (ref 3.5–5.0)
Alkaline Phosphatase: 60 U/L (ref 38–126)
Anion gap: 9 (ref 5–15)
BUN: 23 mg/dL (ref 8–23)
CO2: 30 mmol/L (ref 22–32)
Calcium: 8.9 mg/dL (ref 8.9–10.3)
Chloride: 100 mmol/L (ref 98–111)
Creatinine, Ser: 0.95 mg/dL (ref 0.61–1.24)
GFR, Estimated: >60 mL/min (ref >60)
Glucose, Bld: 91 mg/dL (ref 70–99)
Potassium: 4.3 mmol/L (ref 3.5–5.1)
Sodium: 139 mmol/L (ref 135–145)
Total Bilirubin: 1 mg/dL (ref 0.3–1.2)
Total Protein: 7.2 g/dL (ref 6.5–8.1)

## 2021-04-13 LAB — PSA: Prostatic Specific Antigen: 16.74 ng/mL — ABNORMAL HIGH (ref 0.00–4.00)

## 2021-04-13 MED ORDER — LEUPROLIDE ACETATE (3 MONTH) 22.5 MG IM KIT: 22.5000 mg | PACK | INTRAMUSCULAR | Status: DC

## 2021-04-13 MED ORDER — LEUPROLIDE ACETATE (3 MONTH) 22.5 MG ~~LOC~~ KIT
22.5000 mg | PACK | Freq: Once | SUBCUTANEOUS | Status: AC
  Administered 2021-04-13: 22.5 mg via SUBCUTANEOUS
  Filled 2021-04-13: qty 22.5

## 2021-04-15 ENCOUNTER — Encounter: Payer: Self-pay | Admitting: Radiation Oncology

## 2021-04-15 ENCOUNTER — Ambulatory Visit
Admission: RE | Admit: 2021-04-15 | Discharge: 2021-04-15 | Disposition: A | Discharge status: Home / Self Care | Payer: Medicare Other | Source: Ambulatory Visit | Attending: Radiation Oncology | Admitting: Radiation Oncology

## 2021-04-15 ENCOUNTER — Inpatient Hospital Stay: Payer: Medicare Other

## 2021-04-15 ENCOUNTER — Other Ambulatory Visit: Payer: Self-pay

## 2021-04-15 VITALS — BP 137/102 | HR 76 | Temp 96.5°F | Wt 132.9 lb

## 2021-04-15 DIAGNOSIS — R350 Frequency of micturition: Secondary | ICD-10-CM | POA: Diagnosis not present

## 2021-04-15 DIAGNOSIS — Z923 Personal history of irradiation: Secondary | ICD-10-CM | POA: Diagnosis not present

## 2021-04-15 DIAGNOSIS — C61 Malignant neoplasm of prostate: Secondary | ICD-10-CM | POA: Insufficient documentation

## 2021-04-15 DIAGNOSIS — K5900 Constipation, unspecified: Secondary | ICD-10-CM | POA: Insufficient documentation

## 2021-04-15 DIAGNOSIS — K624 Stenosis of anus and rectum: Secondary | ICD-10-CM | POA: Diagnosis not present

## 2021-04-15 NOTE — Progress Notes (Signed)
Nutrition Follow-up:   Patient with locally advanced prostate cancer.  Patient has completed radiation.   Met with patient following radiation follow up.  Patient reports that appetite is better.  Got up at 10am this morning and drank ensure. Has not eaten anything else today (now 2pm). Says Meals on Wheels came but did not eat it yet today.  Says he lost his bottom teeth and having to eat soft foods.  Son bought him some ensure.    Noted visit from home based palliative care.     Medications: remeron  Labs: reviewed  Anthropometrics:   Weight today 132 lb  127 on 03/26/21 133 lb on 3/25 126 lb on 2/14 per [facility] 133 lb on 2/7   NUTRITION DIAGNOSIS: Inadequate oral intake improving   INTERVENTION:  Provided list of foods high in protein, high in calories and soft to chew.   Goal would be for patient to eat within 1-2 hours of waking and then eating frequently during the day q 2-3 hours.   Patient reports son recently bought him ensure shakes. Encouraged 2-3 per day    MONITORING, EVALUATION, GOAL: weight trends, intake   NEXT VISIT: June, 14 following MD appointment  Jacob [name], [place], [place] Registered Dietitian [phone] (mobile)

## 2021-04-15 NOTE — Progress Notes (Signed)
Radiation Oncology Follow up Note  Name: Jacob [name]   Date:   04/15/2021 MRN:  [id number] DOB: 11-30-1934    This 87 y.o. 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  REFERRING PROVIDER: [name], MD  HPI: Patient is an 87 year old male now 1 month out from palliative radiation therapy to his prostate with rectal obstruction.  He is seen today in follow-up and is doing fairly well.  He states he is somewhat constipated although bowel function has returned pretty much to normal.  He also has some frequent urination and sometimes is slightly incontinent..  Patient was treated with a palliative dose of [place] and there is room for further radiation although with the patient's overall general condition age believe his palliative dose of radiation is sufficient at this time.  COMPLICATIONS OF TREATMENT: none  FOLLOW UP COMPLIANCE: keeps appointments   PHYSICAL EXAM:  BP (!) 137/102   Pulse 76   Temp (!) 96.5 F (35.8 C) (Tympanic)   Wt 132 lb 14.4 oz (60.3 kg)   BMI 18.02 kg/m Well-developed well-nourished patient in NAD. HEENT reveals PERLA, EOMI, discs not visualized.  Oral cavity is clear. No oral mucosal lesions are identified. Neck is clear without evidence of cervical or supraclavicular adenopathy. Lungs are clear to A&P. Cardiac examination is essentially unremarkable with regular rate and rhythm without murmur rub or thrill. Abdomen is benign with no organomegaly or masses noted. Motor sensory and DTR levels are equal and symmetric in the upper and lower extremities. Cranial nerves II through XII are grossly intact. Proprioception is intact. No peripheral adenopathy or edema is identified. No motor or sensory levels are noted. Crude visual fields are within normal range.   RADIOLOGY RESULTS: No current films to review  PLAN: Present time patient is stable with very low side effect  profile.  I will see him back in 3 to 4 months and track his PSA.  We will also set him up with follow-up with Dr. Janese Banks.  He is also has nutritional counseling.  Patient is to call with any concerns.  I would like to take this opportunity to thank you for allowing me to participate in the care of your patient.Noreene Filbert, MD

## 2021-04-16 ENCOUNTER — Other Ambulatory Visit: Payer: Self-pay | Admitting: *Deleted

## 2021-04-16 DIAGNOSIS — C61 Malignant neoplasm of prostate: Secondary | ICD-10-CM

## 2021-04-17 NOTE — Progress Notes (Signed)
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 Hyperemia may also suggest infection/proctitis. This was followed by a colonoscopy on 01/06/2021 which showed a near obstructing mass in the rectum 1 cm from the anal verge. Scope could not be traversed beyond the mass. Biopsy was taken and was positive for adenocarcinoma.Patient ishere with his son today and reports that he can still move his bowels but sometimes he has constipation and sometimes diarrhea. He does not have a good insight into his medical problems. Denies any significant pain but does report occasional rectal discomfort. He is independent of his ADLs  Rectal biopsy showed adenocarcinoma poorly differentiated. Tumor was positive for PSA and PSAP but negative for CK7, CK20 and CDX2. Findings are consistent with adenocarcinoma from prostate origin.  Interval history-patient lives alone and is frail.  He reports some intermittent dizziness as well as falls.  He has been referred to neurology by Dr. Dorthula Perfect.  He is also being followed up by home palliative care.  Denies any significant abdominal pain or difficulty moving his bowels.   ECOG PS- 2-3 Pain scale- 0   Review of systems-  Review of Systems  Constitutional: Positive for malaise/fatigue. Negative for chills, fever and weight loss.  HENT: Negative for congestion, ear discharge and nosebleeds.   Eyes: Negative for blurred vision.  Respiratory: Negative for cough, hemoptysis, sputum production, shortness of breath and wheezing.   Cardiovascular: Negative for chest pain, palpitations, orthopnea and claudication.  Gastrointestinal: Negative for abdominal pain, blood in stool, constipation, diarrhea, heartburn, melena, nausea and vomiting.  Genitourinary: Negative for dysuria, flank pain, frequency, hematuria and urgency.  Musculoskeletal: Positive for falls. Negative for back pain, joint pain and myalgias.  Skin: Negative for rash.  Neurological: Positive for dizziness. Negative for tingling, focal weakness, seizures, weakness and headaches.  Endo/Heme/Allergies: Does not bruise/bleed easily.  Psychiatric/Behavioral: Negative for depression and suicidal ideas. The patient does not have insomnia.       No Known Allergies   Past Medical History:  Diagnosis Date  . BPH (benign prostatic hyperplasia)   . Cancer ([place])   . Chronic constipation 06/25/2015  . COPD (chronic obstructive pulmonary disease) ([place])    pt denies.  . Family history of breast cancer   . Family history of prostate cancer   . Hypertension   . Swelling of lower extremity    bilateral  . Wears dentures    full upper and lower - do not fit well.     Past Surgical History:  Procedure Laterality Date  . CATARACT EXTRACTION W/PHACO Right 09/30/2015   Procedure: CATARACT EXTRACTION PHACO AND INTRAOCULAR LENS PLACEMENT ([place]);  Surgeon: Leandrew Koyanagi, MD;  Location: [facility];  Service: Ophthalmology;  Laterality: Right;  . COLONOSCOPY  2009?  . COLONOSCOPY WITH PROPOFOL N/A 01/06/2021   Procedure: COLONOSCOPY  WITH PROPOFOL;  Surgeon: Lin Landsman, MD;  Location: [facility] ENDOSCOPY;  Service: Gastroenterology;  Laterality:  N/A;  . HERNIA REPAIR Right    x2     Social History   Socioeconomic History  . Marital status: Married    Spouse name: Not on file  . Number of children: Not on file  . Years of education: Not on file  . Highest education level: Not on file  Occupational History  . Not on file  Tobacco Use  . Smoking status: Current Every Day Smoker    Packs/day: 0.25    Years: 45.00    Pack years: 11.25    Types: Cigarettes  . Smokeless tobacco: Never Used  Vaping Use  . Vaping Use: Never used  Substance and Sexual Activity  . Alcohol use: Yes    Comment: drink beer maybe 2 - 3 weeks  . Drug use: No  . Sexual activity: Not on file  Other Topics Concern  . Not on file  Social History Narrative  . Not on file   Social Determinants of Health   Financial Resource Strain: Not on file  Food Insecurity: Not on file  Transportation Needs: Not on file  Physical Activity: Not on file  Stress: Not on file  Social Connections: Not on file  Intimate Partner Violence: Not on file    Family History  Problem Relation Age of Onset  . Prostate cancer Brother   . Alzheimer's disease Mother   . Breast cancer Mother        dx 29 or 32  . Stroke Father   . Heart attack Daughter   . Kidney cancer Neg Hx   . Kidney disease Neg Hx   . Bladder Cancer Neg Hx      Current Outpatient Medications:  .  acetaminophen (TYLENOL) 500 MG tablet, Take 500 mg by mouth every 4 (four) hours as needed. , Disp: , Rfl:  .  amitriptyline (ELAVIL) 10 MG tablet, 10 mg at bedtime., Disp: , Rfl:  .  amLODipine (NORVASC) 10 MG tablet, Take 10 mg by mouth daily. PM, Disp: , Rfl:  .  aspirin 81 MG tablet, Take 81 mg by mouth daily. PM, Disp: , Rfl:  .  cetirizine (ZYRTEC) 10 MG tablet, Take by mouth., Disp: , Rfl:  .  cyanocobalamin 500 MCG tablet, Take 500 mcg by mouth daily. PM, Disp: , Rfl:  .  cyclobenzaprine (FLEXERIL) 5 MG tablet, Take by mouth., Disp: , Rfl:  .  finasteride (PROSCAR) 5 MG tablet, TAKE 1  TABLET BY MOUTH EVERY DAY, Disp: 90 tablet, Rfl: 3 .  hydrochlorothiazide (HYDRODIURIL) 25 MG tablet, Take 25 mg by mouth daily. PM, Disp: , Rfl:  .  metoprolol succinate (TOPROL-XL) 50 MG 24 hr tablet, Take 50 mg by mouth daily., Disp: , Rfl:  .  nortriptyline (PAMELOR) 10 MG capsule, Take 10 mg by mouth at bedtime., Disp: , Rfl:  .  polyethylene glycol powder (GLYCOLAX/MIRALAX) powder, TAKE 17G ONCE DAILY AS NEEDED FOR CONSTIPATION (ADJUST AMOUNT AS NEEDED) MIX IN 4-8 OZ OF FLUID, Disp: , Rfl:  .  Propylene Glycol (SYSTANE BALANCE) 0.6 % SOLN, INSTILL 3 5 TIMES A DAY IN EACH EYE AS NEEDED FOR DRYNESS., Disp: , Rfl:  .  tamsulosin (FLOMAX) 0.4 MG CAPS capsule, TAKE 1 CAPSULE BY MOUTH EVERY DAY, Disp: 90 capsule, Rfl: 3  Physical exam:  Vitals:   04/13/21 1412  BP: (!) 129/92  Pulse: 71  Temp: (!) 96.3 F (  35.7 C)  TempSrc: Tympanic  SpO2: 99%  Weight: 132 lb 8 oz (60.1 kg)   Physical Exam Constitutional:      Comments: Thin elderly gentleman sitting in a wheelchair.  Appears in no acute distress  Cardiovascular:     Rate and Rhythm: Normal rate and regular rhythm.     Heart sounds: Normal heart sounds.  Pulmonary:     Effort: Pulmonary effort is normal.     Breath sounds: Normal breath sounds.  Abdominal:     General: Bowel sounds are normal.     Palpations: Abdomen is soft.  Skin:    General: Skin is warm and dry.  Neurological:     Mental Status: He is alert and oriented to person, place, and time.      CMP Latest Ref Rng & Units 04/13/2021  Glucose 70 - 99 mg/dL 91  BUN 8 - 23 mg/dL 23  Creatinine 0.61 - 1.24 mg/dL 0.95  Sodium 135 - 145 mmol/L 139  Potassium 3.5 - 5.1 mmol/L 4.3  Chloride 98 - 111 mmol/L 100  CO2 22 - 32 mmol/L 30  Calcium 8.9 - 10.3 mg/dL 8.9  Total Protein 6.5 - 8.1 g/dL 7.2  Total Bilirubin 0.3 - 1.2 mg/dL 1.0  Alkaline Phos 38 - 126 U/L 60  AST 15 - 41 U/L 30  ALT 0 - 44 U/L 22   CBC Latest Ref Rng & Units 04/13/2021  WBC 4.0 - 10.5 K/uL  3.3(L)  Hemoglobin 13.0 - 17.0 g/dL 13.9  Hematocrit 39.0 - 52.0 % 43.7  Platelets 150 - 400 K/uL 312    Assessment and plan- Patient is a 87 y.o. 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  Overall he has responded to treatment well as evidenced by lowering of his PSA from his baseline of 549 to 16.74 today.  Ideally he would benefit from addition of oral androgen deprivation therapy.  However I am concerned about his frailty and falls and potential side effects of the medication and his ability to take medications on a consistent basis.  I will therefore hold off on starting any Xtandi or Zytiga at this time.  Repeat CT chest abdomen and pelvis with contrast as well as bone scan in the next 1 month and I will see him thereafter.  Patient is being followed by home palliative care and was also assessed by Occupational Therapy as well as nutrition   Visit Diagnosis 1. Prostate cancer ([place])   2. Encounter for monitoring Lupron therapy      Dr. Randa Evens, MD, MPH [facility] [phone] 04/17/2021 6:32 PM

## 2021-05-05 ENCOUNTER — Telehealth: Payer: Self-pay | Admitting: Adult Health Nurse Practitioner

## 2021-05-05 NOTE — Telephone Encounter (Signed)
Called to remind patient of tomorrow's visit.  Multiple attempts to call patient.  Phone goes to voicemail that has voicemail box that is full so unable to leave message Jacob [name] K. Olena Heckle, NP

## 2021-05-06 ENCOUNTER — Other Ambulatory Visit: Payer: Self-pay

## 2021-05-06 ENCOUNTER — Encounter: Payer: Self-pay | Admitting: Adult Health Nurse Practitioner

## 2021-05-06 ENCOUNTER — Other Ambulatory Visit: Payer: Medicare Other | Admitting: Adult Health Nurse Practitioner

## 2021-05-06 DIAGNOSIS — C61 Malignant neoplasm of prostate: Secondary | ICD-10-CM

## 2021-05-06 DIAGNOSIS — R251 Tremor, unspecified: Secondary | ICD-10-CM

## 2021-05-06 DIAGNOSIS — Z515 Encounter for palliative care: Secondary | ICD-10-CM

## 2021-05-06 NOTE — Progress Notes (Addendum)
[facility] Palliative Care Consult Note Telephone: [phone]  Fax: [phone]    Date of encounter: 05/06/21 PATIENT NAME: Jacob [name] Jacob [name] [id number]   [phone] (home)  DOB: 11/22/1934 MRN: [id number] PRIMARY CARE PROVIDER:    [name], MD,  [place] [address] [phone]  REFERRING PROVIDER:   Dr. Billey Gosling  RESPONSIBLE PARTY:    Contact Information    Name Relation Home Work Mobile   Demarcus, Thielke Son   [phone]   Keary, Waterson Spouse   [phone]       I met face to face with patient in home. Palliative Care was asked to follow this patient by consultation request of  Dr. Billey Gosling to address advance care planning and complex medical decision making. This is a follow up visit.                                   ASSESSMENT AND PLAN / RECOMMENDATIONS:   Advance Care Planning/Goals of Care: Goals include to maximize quality of life and symptom management. Our advance care planning conversation included a discussion about:   Briefly started going over ACP today.  Patient did not want to make any decisions today.  Encouraged to discuss with his family.  Left blank MOST form for him to review with his family.  CODE STATUS: Full code  Symptom Management/Plan:  Prostate cancer: Patient states his treatments have ended.  He is uncertain when next appointment is and states that he usually gets a letter with next appointment.  Continue follow-up and recommendations by oncology  Tremor: Is being followed by neurology for this.  Had appointment yesterday and his amitriptyline was increased from 20 mg nightly to 25 mg nightly and was started on gabapentin 100 mg daily to increase to 100 mg twice daily.  Continue to monitor for effectiveness  Support: Patient had questions about his Medicaid and food stamps applications.  We will have social worker reach out to him to  answer his questions.  Did have conversation with patient today about ALF.  He does state that he has thought about it but wants to stay where he is for as long as possible.  Will continue to assess for readiness for ALF placement.  Follow up Palliative Care Visit: Palliative care will continue to follow for complex medical decision making, advance care planning, and clarification of goals. Return 6 weeks or prn.  Encouraged to call with any questions or concerns  I spent 40 minutes providing this consultation. More than 50% of the time in this consultation was spent in counseling and care coordination.  PPS: 60%  HOSPICE ELIGIBILITY/DIAGNOSIS: TBD  Chief Complaint: Follow-up palliative visit  HISTORY OF PRESENT ILLNESS:  Jacob [name] is a 87 y.o. year old male  with prostate cancer, COPD, CKD stage 2, HTN, BPH, vitamin D deficiency, vitamin B12 deficiency.  Patient endorses that he still is tired.  States that he cannot walk more than about 15 feet without having to take a break.  Denies increased shortness of breath or cough.  Does state having a fall less than 2 weeks ago states that he hit his shoulder on a door frame.  Does state having pain in right shoulder and states that this is improving.  States that he does have some dizziness from time to time associated with  his tiredness.  States that his appetite is okay.  Patient did have increased edema and bilateral lower extremities and his amlodipine was cut in half and started on HCTZ 25 mg daily.  States that his edema is improved.  Rest of 10 point ROS asked and negative except what is stated in HPI  History obtained from review of EMR and interview with  Mr. Rengel.   Physical Exam:  Constitutional: NAD General: frail appearing, thin EYES: anicteric sclera, lids intact, no discharge  ENMT: intact hearing, oral mucous membranes moist CV:   1-2+ LE edema with more around the ankles Pulmonary:  no increased work of breathing, no  cough MSK:  moves all extremities, ambulatory--uses cane at times Skin:  no rashes or wounds on visible skin Neuro:  A and O x 3; states being forgetful  Thank you for the opportunity to participate in the care of Mr. Oelkers.  The palliative care team will continue to follow. Please call our office at [phone] if we can be of additional assistance.   Jaymarion Trombly Jenetta Downer, NP , DNP  This chart was dictated using voice recognition software. Despite best efforts to proofread, errors can occur which can change the documentation meaning.   COVID-19 PATIENT SCREENING TOOL Asked and negative response unless otherwise noted:   Have you had symptoms of covid, tested positive or been in contact with someone with symptoms/positive test in the past 5-10 days? negative

## 2021-05-07 ENCOUNTER — Telehealth: Payer: Self-pay

## 2021-05-07 NOTE — Telephone Encounter (Signed)
05/07/21 @ 230PM: Palliative care SW outreached patient to follow up on questions about medicaid application.   Call unsuccessful. SW unable to LVM due to mailbox being full Will attempt another outreach and later time/date.

## 2021-05-09 ENCOUNTER — Encounter: Payer: Self-pay | Admitting: Oncology

## 2021-05-13 ENCOUNTER — Ambulatory Visit: Payer: Medicare Other | Attending: Oncology

## 2021-05-13 ENCOUNTER — Encounter: Admission: RE | Admit: 2021-05-13 | Payer: Medicare Other | Source: Ambulatory Visit

## 2021-05-17 ENCOUNTER — Telehealth: Payer: Self-pay | Admitting: Oncology

## 2021-05-17 NOTE — Telephone Encounter (Signed)
Spoke with patient's son to notify him of rescheduled scans scheduled (pt missed bone scan and CT). Son stated that he would remind his father and he also requested that we send a copy in the mail. Mailing updated AVS.

## 2021-05-24 ENCOUNTER — Telehealth: Payer: Self-pay | Admitting: Oncology

## 2021-05-24 NOTE — Telephone Encounter (Signed)
Left VM with patient's son to confirm that patient is aware that appt on 6/14 has been moved.

## 2021-05-24 NOTE — Telephone Encounter (Signed)
Attempt made to contact patient to notify him of appts rescheduled. Patient missed his CT scan and per MD--need to move back f/u until after this is completed. Mailbox is full-mailing AVS and will attempt patient's family.

## 2021-05-25 ENCOUNTER — Other Ambulatory Visit: Payer: Self-pay

## 2021-05-25 ENCOUNTER — Inpatient Hospital Stay: Payer: Medicare Other

## 2021-05-25 ENCOUNTER — Inpatient Hospital Stay: Payer: Medicare Other | Admitting: Oncology

## 2021-05-25 ENCOUNTER — Inpatient Hospital Stay: Payer: Medicare Other | Attending: Oncology

## 2021-05-25 NOTE — Progress Notes (Addendum)
Nutrition Follow-up:    Patient with locally advanced prostate cancer.  Patient has completed radiation.   Met with patient at 11 am, didn't realize other appointments had been cancelled.  RD was able to work patient in while in clinic.  Patient says that he has a good appetite.  Reports that yesterday he ate a chicken pie from [facility], couldn't remember anything else he ate.  Says he is drinking ensure but about out of them.  Says that he started taking fluid pill about 2-3 weeks ago.  Is receiving Meals on Wheels but does not always like what is sent.  Says he is picking up bottom teeth today.      Medications: reviewed  Labs: not new  Anthropometrics:   Weight 129 lb 6 oz today in clinic. Patient says that is fluid weight loss  132 lb 14.4 oz on 5/5 127 lb on 4/15 126 lb on 2/14 per [facility] 133 lb on 2/7  Nutrition Focused Physical Exam:  Limited as patient fully clothed.   Upper arm: Moderate Ribs: severe Temple: severe Shoulder: severe Scapula: severe Hand: moderate   NUTRITION DIAGNOSIS: Inadequate oral intake continues   INTERVENTION:  Provided complimentary case of ensure plus to patient today. Coordination of care with Scheduling to review and provide contrast to patient for upcoming appointments Stressed importance of eating foods high in calories and protein Patient may benefit from trial of appetite stimulant.     MONITORING, EVALUATION, GOAL:  Weight trends, intake   NEXT VISIT: ~4-6 weeks  Tanieka Pownall B. Zenia Resides, [place], [place] Registered Dietitian [phone] (mobile)

## 2021-06-01 ENCOUNTER — Ambulatory Visit
Admission: RE | Admit: 2021-06-01 | Discharge: 2021-06-01 | Disposition: A | Discharge status: Home / Self Care | Payer: Medicare Other | Source: Ambulatory Visit | Attending: Oncology | Admitting: Oncology

## 2021-06-01 ENCOUNTER — Telehealth: Payer: Self-pay

## 2021-06-01 ENCOUNTER — Encounter
Admission: RE | Admit: 2021-06-01 | Discharge: 2021-06-01 | Disposition: A | Discharge status: Home / Self Care | Payer: Medicare Other | Source: Ambulatory Visit | Attending: Oncology | Admitting: Oncology

## 2021-06-01 ENCOUNTER — Other Ambulatory Visit: Payer: Self-pay

## 2021-06-01 DIAGNOSIS — C61 Malignant neoplasm of prostate: Secondary | ICD-10-CM | POA: Insufficient documentation

## 2021-06-01 MED ORDER — TECHNETIUM TC 99M MEDRONATE IV KIT
20.0000 | PACK | Freq: Once | INTRAVENOUS | Status: AC | PRN
  Administered 2021-06-01: 21.9 via INTRAVENOUS

## 2021-06-01 NOTE — Telephone Encounter (Signed)
06/01/21 @ 3 PM: Palliative care SW outreached patient to assess and follow up on needs. Call unsuccessful and unable to LVM due to VM being full.   3:03 PM: Palliative care SW outreached patients son, [name], to discuss patient needs and address bed bug concerns. Son shared that he is aware of patients bed bugs and that he has purchased some in home bed bug treatments from [facility]. Son shared that he has talked to patient before about long term planning and placement and patient is against it at this time.   SW placed call out to [facility] to inquire if there are any resources that may assist with bed bug extermination. [place] does not offer any assistance with this unfortunately.

## 2021-06-04 ENCOUNTER — Telehealth: Payer: Self-pay | Admitting: *Deleted

## 2021-06-04 NOTE — Telephone Encounter (Signed)
I called the [place] health dept and then got sent to environmental section of the [place]. I was told that if it is a case in private home that it is up to the person who owns the house to get rid of bed bugs

## 2021-06-10 ENCOUNTER — Ambulatory Visit: Payer: Medicare Other

## 2021-06-10 ENCOUNTER — Other Ambulatory Visit: Payer: Self-pay

## 2021-06-10 ENCOUNTER — Ambulatory Visit
Admission: RE | Admit: 2021-06-10 | Discharge: 2021-06-10 | Disposition: A | Discharge status: Home / Self Care | Payer: Medicare Other | Source: Ambulatory Visit | Attending: Oncology | Admitting: Oncology

## 2021-06-10 DIAGNOSIS — C61 Malignant neoplasm of prostate: Secondary | ICD-10-CM | POA: Insufficient documentation

## 2021-06-10 LAB — POCT I-STAT CREATININE: Creatinine, Ser: 1.1 mg/dL (ref 0.61–1.24)

## 2021-06-10 MED ORDER — IOHEXOL 300 MG/ML  SOLN
75.0000 mL | Freq: Once | INTRAMUSCULAR | Status: AC | PRN
  Administered 2021-06-10: 75 mL via INTRAVENOUS

## 2021-06-11 ENCOUNTER — Other Ambulatory Visit: Payer: Self-pay | Admitting: Physician Assistant

## 2021-06-11 DIAGNOSIS — N138 Other obstructive and reflux uropathy: Secondary | ICD-10-CM

## 2021-06-15 ENCOUNTER — Inpatient Hospital Stay: Payer: Medicare Other

## 2021-06-15 ENCOUNTER — Telehealth: Payer: Self-pay | Admitting: Oncology

## 2021-06-15 ENCOUNTER — Encounter: Payer: Self-pay | Admitting: Oncology

## 2021-06-15 ENCOUNTER — Inpatient Hospital Stay: Payer: Medicare Other | Admitting: Oncology

## 2021-06-15 NOTE — Telephone Encounter (Signed)
Called patient in regards to missed appointment today. Patient stated the he forgot about the appointment and would like to reschedule towards the end of the month. Confirmed new appts details with patient and sending updated AVS in the mail.

## 2021-06-23 ENCOUNTER — Other Ambulatory Visit: Payer: Self-pay

## 2021-06-23 ENCOUNTER — Other Ambulatory Visit: Payer: Medicare Other | Admitting: Student

## 2021-07-05 ENCOUNTER — Inpatient Hospital Stay: Payer: Medicare Other

## 2021-07-05 ENCOUNTER — Inpatient Hospital Stay (HOSPITAL_BASED_OUTPATIENT_CLINIC_OR_DEPARTMENT_OTHER): Payer: Medicare Other | Admitting: Oncology

## 2021-07-05 ENCOUNTER — Other Ambulatory Visit: Payer: Self-pay

## 2021-07-05 ENCOUNTER — Encounter: Payer: Self-pay | Admitting: Oncology

## 2021-07-05 ENCOUNTER — Inpatient Hospital Stay: Payer: Medicare Other | Attending: Oncology

## 2021-07-05 VITALS — BP 130/96 | HR 83 | Temp 96.6°F | Resp 16 | Wt 132.4 lb

## 2021-07-05 DIAGNOSIS — C61 Malignant neoplasm of prostate: Secondary | ICD-10-CM

## 2021-07-05 DIAGNOSIS — F17210 Nicotine dependence, cigarettes, uncomplicated: Secondary | ICD-10-CM | POA: Insufficient documentation

## 2021-07-05 DIAGNOSIS — Z79818 Long term (current) use of other agents affecting estrogen receptors and estrogen levels: Secondary | ICD-10-CM | POA: Diagnosis not present

## 2021-07-05 DIAGNOSIS — I129 Hypertensive chronic kidney disease with stage 1 through stage 4 chronic kidney disease, or unspecified chronic kidney disease: Secondary | ICD-10-CM | POA: Diagnosis not present

## 2021-07-05 DIAGNOSIS — N182 Chronic kidney disease, stage 2 (mild): Secondary | ICD-10-CM | POA: Diagnosis not present

## 2021-07-05 DIAGNOSIS — Z5181 Encounter for therapeutic drug level monitoring: Secondary | ICD-10-CM

## 2021-07-05 DIAGNOSIS — Z803 Family history of malignant neoplasm of breast: Secondary | ICD-10-CM | POA: Diagnosis not present

## 2021-07-05 DIAGNOSIS — N400 Enlarged prostate without lower urinary tract symptoms: Secondary | ICD-10-CM | POA: Diagnosis not present

## 2021-07-05 DIAGNOSIS — Z8042 Family history of malignant neoplasm of prostate: Secondary | ICD-10-CM | POA: Insufficient documentation

## 2021-07-05 LAB — CBC WITH DIFFERENTIAL/PLATELET
Abs Immature Granulocytes: 0.02 10*3/uL (ref 0.00–0.07)
Basophils Absolute: 0 10*3/uL (ref 0.0–0.1)
Basophils Relative: 1 %
Eosinophils Absolute: 0.2 10*3/uL (ref 0.0–0.5)
Eosinophils Relative: 4 %
HCT: 50 % (ref 39.0–52.0)
Hemoglobin: 16.1 g/dL (ref 13.0–17.0)
Immature Granulocytes: 1 %
Lymphocytes Relative: 25 %
Lymphs Abs: 1 10*3/uL (ref 0.7–4.0)
MCH: 29.3 pg (ref 26.0–34.0)
MCHC: 32.2 g/dL (ref 30.0–36.0)
MCV: 91.1 fL (ref 80.0–100.0)
Monocytes Absolute: 0.5 10*3/uL (ref 0.1–1.0)
Monocytes Relative: 13 %
Neutro Abs: 2.3 10*3/uL (ref 1.7–7.7)
Neutrophils Relative %: 56 %
Platelets: 374 10*3/uL (ref 150–400)
RBC: 5.49 MIL/uL (ref 4.22–5.81)
RDW: 13.6 % (ref 11.5–15.5)
WBC: 4.1 10*3/uL (ref 4.0–10.5)
nRBC: 0 % (ref 0.0–0.2)

## 2021-07-05 LAB — COMPREHENSIVE METABOLIC PANEL
ALT: 14 U/L (ref 0–44)
AST: 26 U/L (ref 15–41)
Albumin: 3.7 g/dL (ref 3.5–5.0)
Alkaline Phosphatase: 64 U/L (ref 38–126)
Anion gap: 9 (ref 5–15)
BUN: 20 mg/dL (ref 8–23)
CO2: 29 mmol/L (ref 22–32)
Calcium: 9.2 mg/dL (ref 8.9–10.3)
Chloride: 100 mmol/L (ref 98–111)
Creatinine, Ser: 1.21 mg/dL (ref 0.61–1.24)
GFR, Estimated: 59 mL/min — ABNORMAL LOW (ref >60)
Glucose, Bld: 96 mg/dL (ref 70–99)
Potassium: 4.7 mmol/L (ref 3.5–5.1)
Sodium: 138 mmol/L (ref 135–145)
Total Bilirubin: 0.7 mg/dL (ref 0.3–1.2)
Total Protein: 7.3 g/dL (ref 6.5–8.1)

## 2021-07-05 LAB — PSA: Prostatic Specific Antigen: 84.3 ng/mL — ABNORMAL HIGH (ref 0.00–4.00)

## 2021-07-05 MED ORDER — LEUPROLIDE ACETATE (3 MONTH) 22.5 MG ~~LOC~~ KIT
22.5000 mg | PACK | Freq: Once | SUBCUTANEOUS | Status: AC
  Administered 2021-07-05: 22.5 mg via SUBCUTANEOUS
  Filled 2021-07-05: qty 22.5

## 2021-07-05 NOTE — Progress Notes (Signed)
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  Hyperemia may also suggest infection/proctitis.  This was followed by a colonoscopy on 01/06/2021 which showed a near obstructing mass in the rectum 1 cm from the anal verge.  Scope could not be traversed beyond the mass.  Biopsy was taken and was positive for adenocarcinoma.  Patient is here with his son today and reports that he can still move his bowels but sometimes he has constipation and sometimes diarrhea.  He does not have a good insight into his medical problems.  Denies any significant pain but does report occasional rectal discomfort. He is independent of his ADLs   Rectal biopsy showed adenocarcinoma poorly differentiated.  Tumor was positive for PSA and PSAP but negative for CK7, CK20 and CDX2.  Findings are consistent with adenocarcinoma from prostate origin.  Interval history-patient lives alone and reports that he has had 5 falls since his last visit.  He does not shower every day and only does so infrequently.  Oral intake is also unreliable.  He was recently found to have bedbugs when he went for a CT scan.  He was given a bedbug repellent by his Son and he is trying  to get rid of those.  He does not want to be shifted to a long-term facility yet.  Home-based palliative care social worker tried to reach him as well but he did not answer his phone calls.  Denies any difficulty with his bowel movements  ECOG PS- 2 Pain scale- 0   Review of systems- Review of Systems  Constitutional:  Positive for malaise/fatigue. Negative for chills, fever and weight loss.  HENT:  Negative for congestion, ear discharge and nosebleeds.   Eyes:  Negative for blurred vision.  Respiratory:  Negative for cough, hemoptysis, sputum production, shortness of breath and wheezing.   Cardiovascular:  Negative for chest pain, palpitations, orthopnea and claudication.  Gastrointestinal:  Negative for abdominal pain, blood in stool, constipation, diarrhea, heartburn, melena, nausea and vomiting.  Genitourinary:  Negative for dysuria, flank pain, frequency, hematuria and urgency.  Musculoskeletal:  Negative for back pain, joint pain and myalgias.  Skin:  Negative for rash.  Neurological:  Negative for dizziness, tingling, focal weakness, seizures, weakness and headaches.  Endo/Heme/Allergies:  Does not bruise/bleed easily.  Psychiatric/Behavioral:  Negative for depression and suicidal ideas. The patient does not have insomnia.       No Known Allergies   Past Medical History:  Diagnosis Date   BPH (benign prostatic hyperplasia)    Cancer (HCC)    Chronic constipation 06/25/2015   COPD (chronic obstructive pulmonary disease) ([place])    pt denies.   Family history of breast cancer    Family history of prostate cancer    Hypertension    Swelling  of lower extremity    bilateral   Wears dentures    full upper and lower - do not fit well.     Past Surgical History:  Procedure Laterality Date   CATARACT EXTRACTION W/PHACO Right 09/30/2015   Procedure: CATARACT EXTRACTION PHACO AND INTRAOCULAR LENS PLACEMENT (IOC);  Surgeon: Leandrew Koyanagi, MD;  Location: [place];   Service: Ophthalmology;  Laterality: Right;   COLONOSCOPY  2009?   COLONOSCOPY WITH PROPOFOL N/A 01/06/2021   Procedure: COLONOSCOPY WITH PROPOFOL;  Surgeon: Lin Landsman, MD;  Location: [facility] ENDOSCOPY;  Service: Gastroenterology;  Laterality: N/A;   HERNIA REPAIR Right    x2     Social History   Socioeconomic History   Marital status: Married    Spouse name: Not on file   Number of children: Not on file   Years of education: Not on file   Highest education level: Not on file  Occupational History   Not on file  Tobacco Use   Smoking status: Every Day    Packs/day: 0.25    Years: 45.00    Pack years: 11.25    Types: Cigarettes   Smokeless tobacco: Never  Vaping Use   Vaping Use: Never used  Substance and Sexual Activity   Alcohol use: Yes    Comment: drink beer maybe 2 - 3 weeks   Drug use: No   Sexual activity: Not on file  Other Topics Concern   Not on file  Social History Narrative   Not on file   Social Determinants of Health   Financial Resource Strain: Not on file  Food Insecurity: Not on file  Transportation Needs: Not on file  Physical Activity: Not on file  Stress: Not on file  Social Connections: Not on file  Intimate Partner Violence: Not on file    Family History  Problem Relation Age of Onset   Prostate cancer Brother    Alzheimer's disease Mother    Breast cancer Mother        dx 43 or 14   Stroke Father    Heart attack Daughter    Kidney cancer Neg Hx    Kidney disease Neg Hx    Bladder Cancer Neg Hx      Current Outpatient Medications:    amitriptyline (ELAVIL) 25 MG tablet, Take 25 mg by mouth at bedtime., Disp: , Rfl:    amLODipine (NORVASC) 10 MG tablet, Take 10 mg by mouth daily. PM, Disp: , Rfl:    amLODipine (NORVASC) 10 MG tablet, Take by mouth., Disp: , Rfl:    aspirin 81 MG tablet, Take 81 mg by mouth daily. PM, Disp: , Rfl:    carbidopa-levodopa (SINEMET IR) 25-100 MG tablet, Take by mouth., Disp: , Rfl:     cetirizine (ZYRTEC) 10 MG tablet, Take by mouth., Disp: , Rfl:    cyanocobalamin 500 MCG tablet, Take 500 mcg by mouth daily. PM, Disp: , Rfl:    cyclobenzaprine (FLEXERIL) 5 MG tablet, Take by mouth., Disp: , Rfl:    finasteride (PROSCAR) 5 MG tablet, TAKE 1 TABLET BY MOUTH EVERY DAY, Disp: 90 tablet, Rfl: 3   gabapentin (NEURONTIN) 100 MG capsule, Take by mouth., Disp: , Rfl:    hydrochlorothiazide (HYDRODIURIL) 25 MG tablet, Take 25 mg by mouth daily. PM, Disp: , Rfl:    metoprolol succinate (TOPROL-XL) 50 MG 24 hr tablet, Take 50 mg by mouth daily., Disp: , Rfl:    mirtazapine (REMERON) 7.5 MG tablet, Take by mouth.,  Disp: , Rfl:    nortriptyline (PAMELOR) 10 MG capsule, Take 10 mg by mouth at bedtime., Disp: , Rfl:    polyethylene glycol powder (GLYCOLAX/MIRALAX) powder, TAKE 17G ONCE DAILY AS NEEDED FOR CONSTIPATION (ADJUST AMOUNT AS NEEDED) MIX IN 4-8 OZ OF FLUID, Disp: , Rfl:    Propylene Glycol (SYSTANE BALANCE) 0.6 % SOLN, INSTILL 3 5 TIMES A DAY IN EACH EYE AS NEEDED FOR DRYNESS., Disp: , Rfl:    tamsulosin (FLOMAX) 0.4 MG CAPS capsule, TAKE 1 CAPSULE BY MOUTH EVERY DAY, Disp: 90 capsule, Rfl: 3   torsemide (DEMADEX) 10 MG tablet, Take 1 tablet by mouth daily., Disp: , Rfl:    acetaminophen (TYLENOL) 500 MG tablet, Take 500 mg by mouth every 4 (four) hours as needed.  (Patient not taking: Reported on 07/05/2021), Disp: , Rfl:    amitriptyline (ELAVIL) 10 MG tablet, 10 mg at bedtime. (Patient not taking: Reported on 07/05/2021), Disp: , Rfl:   Physical exam:  Vitals:   07/05/21 1029  BP: (!) 130/96  Pulse: 83  Resp: 16  Temp: (!) 96.6 F (35.9 C)  SpO2: 96%  Weight: 132 lb 6.4 oz (60.1 kg)   Physical Exam Constitutional:      Comments: Sitting in a wheelchair.  Appears in no acute distress  Cardiovascular:     Rate and Rhythm: Normal rate and regular rhythm.     Heart sounds: Normal heart sounds.  Pulmonary:     Effort: Pulmonary effort is normal.     Breath sounds: Normal  breath sounds.  Abdominal:     General: Bowel sounds are normal.     Palpations: Abdomen is soft.  Skin:    General: Skin is warm and dry.  Neurological:     Mental Status: He is alert and oriented to person, place, and time.     CMP Latest Ref Rng & Units 07/05/2021  Glucose 70 - 99 mg/dL 96  BUN 8 - 23 mg/dL 20  Creatinine 0.61 - 1.24 mg/dL 1.21  Sodium 135 - 145 mmol/L 138  Potassium 3.5 - 5.1 mmol/L 4.7  Chloride 98 - 111 mmol/L 100  CO2 22 - 32 mmol/L 29  Calcium 8.9 - 10.3 mg/dL 9.2  Total Protein 6.5 - 8.1 g/dL 7.3  Total Bilirubin 0.3 - 1.2 mg/dL 0.7  Alkaline Phos 38 - 126 U/L 64  AST 15 - 41 U/L 26  ALT 0 - 44 U/L 14   CBC Latest Ref Rng & Units 07/05/2021  WBC 4.0 - 10.5 K/uL 4.1  Hemoglobin 13.0 - 17.0 g/dL 16.1  Hematocrit 39.0 - 52.0 % 50.0  Platelets 150 - 400 K/uL 374    No images are attached to the encounter.  CT Abdomen Pelvis W Contrast  Result Date: 06/11/2021 CLINICAL DATA:  Metastatic prostate carcinoma. EXAM: CT ABDOMEN AND PELVIS WITH CONTRAST TECHNIQUE: Multidetector CT imaging of the abdomen and pelvis was performed using the standard protocol following bolus administration of intravenous contrast. CONTRAST:  53m OMNIPAQUE IOHEXOL 300 MG/ML  SOLN COMPARISON:  Abdomen only CT on 01/21/2021, and pelvis MRI on 01/27/2021 FINDINGS: Lower Chest: No acute findings. Hepatobiliary: A few tiny sub-cm cysts are noted. A few other tiny sub-cm low-attenuation lesions are too small to characterize but likely represent tiny cysts or hemangiomas. Several small approximately 1 cm calcified gallstones are seen, however there is no evidence of cholecystitis or biliary ductal dilatation. Pancreas:  No mass or inflammatory changes. Spleen: Within normal limits in size and appearance. Adrenals/Urinary  Tract: Stable 1.5 cm homogeneous left adrenal mass, consistent with benign adenoma. Kidneys are enlarged with multiple cysts throughout both, however there is no evidence of  solid renal masses or hydronephrosis. Mass effect noted on bladder base from enlarged prostate. Stomach/Bowel: Diffuse rectal wall thickening is again seen, without significant change to recent MRI. This is contiguous with the posterior wall of the prostate. Vascular/Lymphatic: No pelvic lymphadenopathy identified. An 8 mm left paraaortic lymph node is seen on image 22/2, without significant change compared to prior study. No acute vascular findings. Reproductive: Mild to moderately enlarged prostate gland remains stable, with abnormal soft tissue density posteriorly which is contiguous with the rectum. Other:  None. Musculoskeletal: Diffuse bone metastases are seen throughout the spine and pelvis. Increase size and number of sclerotic bone metastases seen compared to a prior pelvis CT on 10/08/2020. IMPRESSION: Mild to moderately enlarged prostate gland, with abnormal soft tissue density which is contiguous with the rectum which shows diffuse wall thickening. This is similar in appearance to previous studies. Stable 8 mm left paraaortic retroperitoneal lymph node. No other lymphadenopathy identified. Diffuse bone metastases, with increase since 10/08/2020 pelvis CT. Cholelithiasis. No radiographic evidence of cholecystitis. Stable enlarged kidneys with numerous cysts. Electronically Signed   By: Marlaine Hind M.D.   On: 06/11/2021 12:36     Assessment and plan- Patient is a 87 y.o. 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  He will receive his next dose today.  PSA from today is pending but his last PSA in June 2022 showed that it was trending down from baseline of 5 49-16.74.Recent CT abdomen and pelvis with contrast showed overall stable disease with mild to moderately enlarged prostate gland involving the rectum.   Subcentimeter retroperitoneal adenopathy and diffuse bone metastases which was stable as compared to February 2022 but increases compared to October 2021.  Ideally he would qualify for additional antiandrogen therapy such as Zytiga or Xtandi.  However his social situation is poor and he is unable to take care of his basic ADLs.  I have highlighted my concerns in the history above.  He has been difficult to reach even with home palliative care.  Due to all these reasons I will keep him on Lupron alone for now with close monitoring of his PSA every 3 months.  If there is a consistent increase in his PSA and/or evidence of radiological progression I will consider adding oral antiandrogen agents at that time based on his compliance assessment  I will see him back in 3 months with CBC with differential CMP and PSA for the next dose of Lupron   Visit Diagnosis 1. Prostate cancer ([place])   2. Encounter for monitoring Lupron therapy      Dr. Randa Evens, MD, MPH [facility] [id number] 07/05/2021 11:34 AM

## 2021-07-05 NOTE — Progress Notes (Signed)
Pt states he needs more ensure feeding supplement.

## 2021-07-13 ENCOUNTER — Inpatient Hospital Stay: Payer: Medicare Other | Attending: Oncology

## 2021-07-13 NOTE — Progress Notes (Signed)
Nutrition Follow-up:  Patient with locally advanced prostate cancer.  Patient has completed radiation.  Continues on lupron.  Called and spoke with patient via phone for nutrition follow-up.  Patient reports that he has a good appetite.  "Nothing wrong with my appetite."  Says that he is having a hard time chewing.  Needs soft foods.  Says he ate chicken pie, mashed potatoes and cobbler recently.  Says he ate eggs biscuit and gravy for breakfast.  Can't remember what he ate yesterday.  Says that he is drinking ensure shakes 2-3 per day.  Has appointment with dentist on 8/8 per patient.      Medications: reviewed  Labs: reviewed  Anthropometrics:   Weight 7/25 132 lb increased  129 lb 6 oz on 6/14  132 lb 14.4 oz on 5/5 127 lb on 4/15 133 lb on 2/7   NUTRITION DIAGNOSIS: Inadequate oral intake stable   INTERVENTION:  Complimentary case of ensure plus left at registration desk for patient to pick up today.   Discussed foods that are easy to chew that patient likes (pimento cheese, chicken salad, eggs, ice cream, pudding, soups).  Patient says that he has these already    MONITORING, EVALUATION, GOAL: weight trends, intake   NEXT VISIT: Tuesday, October 25 after MD visit  Kirkland Figg B. Zenia Resides, [place], [place] Registered Dietitian [phone] (mobile)

## 2021-07-15 ENCOUNTER — Telehealth: Payer: Self-pay | Admitting: Student

## 2021-07-15 ENCOUNTER — Other Ambulatory Visit: Payer: Self-pay

## 2021-07-15 ENCOUNTER — Other Ambulatory Visit: Payer: Medicare Other | Admitting: Student

## 2021-07-15 NOTE — Telephone Encounter (Signed)
Palliative NP arrived to patient's home for scheduled visit at 11am. Patient did not answer the door after knocking for several minutes. NP had spoken with patient to confirm appointment prior to. NP left messages on both patient's and his son Tony's phone today while still at patient's home. Awaiting return call.

## 2021-07-26 ENCOUNTER — Ambulatory Visit: Payer: Medicare Other | Admitting: Radiation Oncology

## 2021-07-28 ENCOUNTER — Emergency Department: Payer: Medicare Other

## 2021-07-28 ENCOUNTER — Encounter: Payer: Self-pay | Admitting: Oncology

## 2021-07-28 ENCOUNTER — Other Ambulatory Visit: Payer: Self-pay

## 2021-07-28 ENCOUNTER — Inpatient Hospital Stay
Admission: EM | Admit: 2021-07-28 | Discharge: 2021-08-03 | DRG: 193 | Disposition: A | Discharge status: Home Health | Payer: Medicare Other | Attending: Internal Medicine | Admitting: Internal Medicine

## 2021-07-28 ENCOUNTER — Encounter: Payer: Self-pay | Admitting: Emergency Medicine

## 2021-07-28 DIAGNOSIS — Z7982 Long term (current) use of aspirin: Secondary | ICD-10-CM | POA: Diagnosis not present

## 2021-07-28 DIAGNOSIS — J9601 Acute respiratory failure with hypoxia: Secondary | ICD-10-CM | POA: Diagnosis present

## 2021-07-28 DIAGNOSIS — R531 Weakness: Secondary | ICD-10-CM

## 2021-07-28 DIAGNOSIS — Z638 Other specified problems related to primary support group: Secondary | ICD-10-CM | POA: Diagnosis not present

## 2021-07-28 DIAGNOSIS — C61 Malignant neoplasm of prostate: Secondary | ICD-10-CM | POA: Diagnosis present

## 2021-07-28 DIAGNOSIS — F32A Depression, unspecified: Secondary | ICD-10-CM | POA: Diagnosis present

## 2021-07-28 DIAGNOSIS — C7951 Secondary malignant neoplasm of bone: Secondary | ICD-10-CM | POA: Diagnosis present

## 2021-07-28 DIAGNOSIS — K5900 Constipation, unspecified: Secondary | ICD-10-CM | POA: Diagnosis present

## 2021-07-28 DIAGNOSIS — W57XXXA Bitten or stung by nonvenomous insect and other nonvenomous arthropods, initial encounter: Secondary | ICD-10-CM | POA: Diagnosis present

## 2021-07-28 DIAGNOSIS — J439 Emphysema, unspecified: Secondary | ICD-10-CM | POA: Diagnosis present

## 2021-07-28 DIAGNOSIS — Z8042 Family history of malignant neoplasm of prostate: Secondary | ICD-10-CM | POA: Diagnosis not present

## 2021-07-28 DIAGNOSIS — G20 Parkinson's disease: Secondary | ICD-10-CM | POA: Diagnosis present

## 2021-07-28 DIAGNOSIS — I700 Atherosclerosis of aorta: Secondary | ICD-10-CM | POA: Diagnosis present

## 2021-07-28 DIAGNOSIS — J441 Chronic obstructive pulmonary disease with (acute) exacerbation: Secondary | ICD-10-CM

## 2021-07-28 DIAGNOSIS — E43 Unspecified severe protein-calorie malnutrition: Secondary | ICD-10-CM | POA: Diagnosis present

## 2021-07-28 DIAGNOSIS — Z79899 Other long term (current) drug therapy: Secondary | ICD-10-CM | POA: Diagnosis not present

## 2021-07-28 DIAGNOSIS — I129 Hypertensive chronic kidney disease with stage 1 through stage 4 chronic kidney disease, or unspecified chronic kidney disease: Secondary | ICD-10-CM | POA: Diagnosis present

## 2021-07-28 DIAGNOSIS — Z515 Encounter for palliative care: Secondary | ICD-10-CM

## 2021-07-28 DIAGNOSIS — N182 Chronic kidney disease, stage 2 (mild): Secondary | ICD-10-CM | POA: Diagnosis present

## 2021-07-28 DIAGNOSIS — I10 Essential (primary) hypertension: Secondary | ICD-10-CM | POA: Diagnosis not present

## 2021-07-28 DIAGNOSIS — Z8249 Family history of ischemic heart disease and other diseases of the circulatory system: Secondary | ICD-10-CM

## 2021-07-28 DIAGNOSIS — N400 Enlarged prostate without lower urinary tract symptoms: Secondary | ICD-10-CM | POA: Diagnosis present

## 2021-07-28 DIAGNOSIS — Z20822 Contact with and (suspected) exposure to covid-19: Secondary | ICD-10-CM | POA: Diagnosis present

## 2021-07-28 DIAGNOSIS — F17210 Nicotine dependence, cigarettes, uncomplicated: Secondary | ICD-10-CM | POA: Diagnosis present

## 2021-07-28 DIAGNOSIS — R0602 Shortness of breath: Secondary | ICD-10-CM | POA: Diagnosis present

## 2021-07-28 DIAGNOSIS — T148XXA Other injury of unspecified body region, initial encounter: Secondary | ICD-10-CM | POA: Diagnosis present

## 2021-07-28 DIAGNOSIS — J189 Pneumonia, unspecified organism: Secondary | ICD-10-CM | POA: Diagnosis present

## 2021-07-28 DIAGNOSIS — J181 Lobar pneumonia, unspecified organism: Secondary | ICD-10-CM

## 2021-07-28 DIAGNOSIS — W57XXXS Bitten or stung by nonvenomous insect and other nonvenomous arthropods, sequela: Secondary | ICD-10-CM | POA: Diagnosis not present

## 2021-07-28 LAB — BASIC METABOLIC PANEL
Anion gap: 14 (ref 5–15)
BUN: 29 mg/dL — ABNORMAL HIGH (ref 8–23)
CO2: 30 mmol/L (ref 22–32)
Calcium: 9.5 mg/dL (ref 8.9–10.3)
Chloride: 96 mmol/L — ABNORMAL LOW (ref 98–111)
Creatinine, Ser: 1.34 mg/dL — ABNORMAL HIGH (ref 0.61–1.24)
GFR, Estimated: 52 mL/min — ABNORMAL LOW (ref >60)
Glucose, Bld: 150 mg/dL — ABNORMAL HIGH (ref 70–99)
Potassium: 3.7 mmol/L (ref 3.5–5.1)
Sodium: 140 mmol/L (ref 135–145)

## 2021-07-28 LAB — CBC
HCT: 54.2 % — ABNORMAL HIGH (ref 39.0–52.0)
Hemoglobin: 17.6 g/dL — ABNORMAL HIGH (ref 13.0–17.0)
MCH: 28.9 pg (ref 26.0–34.0)
MCHC: 32.5 g/dL (ref 30.0–36.0)
MCV: 89 fL (ref 80.0–100.0)
Platelets: 420 10*3/uL — ABNORMAL HIGH (ref 150–400)
RBC: 6.09 MIL/uL — ABNORMAL HIGH (ref 4.22–5.81)
RDW: 13.4 % (ref 11.5–15.5)
WBC: 8.8 10*3/uL (ref 4.0–10.5)
nRBC: 0 % (ref 0.0–0.2)

## 2021-07-28 LAB — D-DIMER, QUANTITATIVE: D-Dimer, Quant: 3.68 ug/mL-FEU — ABNORMAL HIGH (ref 0.00–0.50)

## 2021-07-28 LAB — RESP PANEL BY RT-PCR (FLU A&B, COVID) ARPGX2
Influenza A by PCR: NEGATIVE
Influenza B by PCR: NEGATIVE
SARS Coronavirus 2 by RT PCR: NEGATIVE

## 2021-07-28 LAB — TROPONIN I (HIGH SENSITIVITY)
Troponin I (High Sensitivity): 25 ng/L — ABNORMAL HIGH (ref <18)
Troponin I (High Sensitivity): 33 ng/L — ABNORMAL HIGH (ref <18)

## 2021-07-28 LAB — BRAIN NATRIURETIC PEPTIDE: B Natriuretic Peptide: 123.8 pg/mL — ABNORMAL HIGH (ref 0.0–100.0)

## 2021-07-28 MED ORDER — IPRATROPIUM-ALBUTEROL 0.5-2.5 (3) MG/3ML IN SOLN
3.0000 mL | Freq: Once | RESPIRATORY_TRACT | Status: AC
  Administered 2021-07-28: 3 mL via RESPIRATORY_TRACT
  Filled 2021-07-28: qty 3

## 2021-07-28 MED ORDER — IOHEXOL 350 MG/ML SOLN
75.0000 mL | Freq: Once | INTRAVENOUS | Status: AC | PRN
  Administered 2021-07-28: 75 mL via INTRAVENOUS

## 2021-07-28 MED ORDER — CARBIDOPA-LEVODOPA 25-100 MG PO TABS
1.0000 | ORAL_TABLET | Freq: Three times a day (TID) | ORAL | Status: DC
  Administered 2021-07-28 – 2021-08-03 (×17): 1 via ORAL
  Filled 2021-07-28 (×18): qty 1

## 2021-07-28 MED ORDER — METHYLPREDNISOLONE SODIUM SUCC 40 MG IJ SOLR
40.0000 mg | Freq: Two times a day (BID) | INTRAMUSCULAR | Status: AC
Start: 2021-07-28 — End: 2021-07-29
  Administered 2021-07-28 – 2021-07-29 (×2): 40 mg via INTRAVENOUS
  Filled 2021-07-28 (×3): qty 1

## 2021-07-28 MED ORDER — MIRTAZAPINE 15 MG PO TABS
7.5000 mg | ORAL_TABLET | Freq: Every day | ORAL | Status: DC
  Administered 2021-07-28 – 2021-08-02 (×6): 7.5 mg via ORAL
  Filled 2021-07-28 (×6): qty 1

## 2021-07-28 MED ORDER — METOPROLOL SUCCINATE ER 50 MG PO TB24
50.0000 mg | ORAL_TABLET | Freq: Every day | ORAL | Status: DC
  Administered 2021-07-28 – 2021-08-03 (×7): 50 mg via ORAL
  Filled 2021-07-28 (×7): qty 1

## 2021-07-28 MED ORDER — GABAPENTIN 100 MG PO CAPS
100.0000 mg | ORAL_CAPSULE | Freq: Two times a day (BID) | ORAL | Status: DC
  Administered 2021-07-28 – 2021-08-03 (×12): 100 mg via ORAL
  Filled 2021-07-28 (×12): qty 1

## 2021-07-28 MED ORDER — ENOXAPARIN SODIUM 40 MG/0.4ML IJ SOSY
40.0000 mg | PREFILLED_SYRINGE | INTRAMUSCULAR | Status: DC
  Administered 2021-07-28 – 2021-08-02 (×6): 40 mg via SUBCUTANEOUS
  Filled 2021-07-28 (×6): qty 0.4

## 2021-07-28 MED ORDER — SODIUM CHLORIDE 0.9 % IV SOLN
500.0000 mg | INTRAVENOUS | Status: DC
  Administered 2021-07-28 – 2021-07-30 (×3): 500 mg via INTRAVENOUS
  Filled 2021-07-28 (×4): qty 500

## 2021-07-28 MED ORDER — ACETAMINOPHEN 500 MG PO TABS
500.0000 mg | ORAL_TABLET | ORAL | Status: DC | PRN
  Administered 2021-08-01: 500 mg via ORAL
  Filled 2021-07-28 (×2): qty 1

## 2021-07-28 MED ORDER — VITAMIN B-12 1000 MCG PO TABS
500.0000 ug | ORAL_TABLET | Freq: Every day | ORAL | Status: DC
  Administered 2021-07-28 – 2021-08-02 (×6): 500 ug via ORAL
  Filled 2021-07-28 (×6): qty 1

## 2021-07-28 MED ORDER — AMLODIPINE BESYLATE 5 MG PO TABS
5.0000 mg | ORAL_TABLET | Freq: Every day | ORAL | Status: DC
  Administered 2021-07-28 – 2021-08-03 (×7): 5 mg via ORAL
  Filled 2021-07-28 (×7): qty 1

## 2021-07-28 MED ORDER — TAMSULOSIN HCL 0.4 MG PO CAPS
0.4000 mg | ORAL_CAPSULE | Freq: Every day | ORAL | Status: DC
  Administered 2021-07-28 – 2021-08-03 (×7): 0.4 mg via ORAL
  Filled 2021-07-28 (×7): qty 1

## 2021-07-28 MED ORDER — IPRATROPIUM-ALBUTEROL 0.5-2.5 (3) MG/3ML IN SOLN
3.0000 mL | Freq: Four times a day (QID) | RESPIRATORY_TRACT | Status: DC | PRN
  Filled 2021-07-28: qty 3

## 2021-07-28 MED ORDER — ASPIRIN EC 81 MG PO TBEC
81.0000 mg | DELAYED_RELEASE_TABLET | Freq: Every day | ORAL | Status: DC
  Administered 2021-07-28 – 2021-08-03 (×7): 81 mg via ORAL
  Filled 2021-07-28 (×7): qty 1

## 2021-07-28 MED ORDER — AMITRIPTYLINE HCL 10 MG PO TABS
10.0000 mg | ORAL_TABLET | Freq: Every day | ORAL | Status: DC
  Administered 2021-07-28 – 2021-08-02 (×6): 10 mg via ORAL
  Filled 2021-07-28 (×7): qty 1

## 2021-07-28 MED ORDER — FINASTERIDE 5 MG PO TABS
5.0000 mg | ORAL_TABLET | Freq: Every day | ORAL | Status: DC
  Administered 2021-07-28 – 2021-08-03 (×7): 5 mg via ORAL
  Filled 2021-07-28 (×8): qty 1

## 2021-07-28 MED ORDER — PREDNISONE 20 MG PO TABS
40.0000 mg | ORAL_TABLET | Freq: Every day | ORAL | Status: AC
Start: 2021-07-29 — End: 2021-08-02
  Administered 2021-07-30 – 2021-08-01 (×3): 40 mg via ORAL
  Filled 2021-07-28 (×3): qty 2

## 2021-07-28 MED ORDER — CEFTRIAXONE SODIUM 2 G IJ SOLR
2.0000 g | INTRAMUSCULAR | Status: DC
  Administered 2021-07-28 – 2021-07-31 (×4): 2 g via INTRAVENOUS
  Filled 2021-07-28 (×2): qty 20
  Filled 2021-07-28 (×2): qty 2
  Filled 2021-07-28 (×2): qty 20

## 2021-07-28 MED ORDER — SODIUM CHLORIDE 0.9 % IV SOLN
100.0000 mg | Freq: Two times a day (BID) | INTRAVENOUS | Status: DC
  Administered 2021-07-28: 100 mg via INTRAVENOUS
  Filled 2021-07-28 (×2): qty 100

## 2021-07-28 MED ORDER — METHYLPREDNISOLONE SODIUM SUCC 125 MG IJ SOLR
125.0000 mg | Freq: Once | INTRAMUSCULAR | Status: AC
  Administered 2021-07-28: 125 mg via INTRAVENOUS
  Filled 2021-07-28: qty 2

## 2021-07-28 MED ORDER — HYDROCHLOROTHIAZIDE 25 MG PO TABS: 25.0000 mg | ORAL_TABLET | Freq: Every day | ORAL | Status: DC

## 2021-07-28 NOTE — ED Notes (Signed)
Pt is asleep at this time and requests to have the [facility] lowered.

## 2021-07-28 NOTE — ED Notes (Signed)
Patient cleaned due to incontinence of urine. New brief placed. primofit placed and hooked up to suction.   Patients t shirt, button up shirt, pants, suspenders, and shoes placed in belongings bag at bedside.  Patients wallet and hat also put in belongings bag

## 2021-07-28 NOTE — ED Provider Notes (Signed)
[facility] Emergency Department Provider Note  ____________________________________________   Event Date/Time   First MD Initiated Contact with Patient 07/28/21 1122     (approximate)  I have reviewed the triage vital signs and the nursing notes.   HISTORY  Chief Complaint Shortness of Breath    HPI Jacob [name] is a 87 y.o. male with past medical history of COPD, hypertension, CKD, here with shortness of breath.  Patient arrives via [facility] [facility].  He has a history of prostate cancer and follows with the cancer clinic as well.  He reports that over the last 2 days, he has had progressively worsening shortness of breath.  This is been present at rest as well as with exertion.  He has had mild increase in dry cough.  No sputum production.  No hemoptysis.  Denies chest pain.  He went to [facility] to tell them about his shortness of breath and was sent here after he was found hypoxic.  Denies history of hypoxia or requiring oxygen.  He does have a significant smoking history but no longer smokes according to his report.  Denies any fevers.  Denies any sputum production.  No known sick contacts.    Past Medical History:  Diagnosis Date   BPH (benign prostatic hyperplasia)    Cancer (HCC)    Chronic constipation 06/25/2015   COPD (chronic obstructive pulmonary disease) ([place])    pt denies.   Family history of breast cancer    Family history of prostate cancer    Hypertension    Swelling of lower extremity    bilateral   Wears dentures    full upper and lower - do not fit well.    Patient Active Problem List   Diagnosis Date Noted   CAP (community acquired pneumonia) 07/28/2021   Parkinson disease ([place]) 07/28/2021   Depression 07/28/2021   Genetic testing 04/12/2021   Family history of prostate cancer    Family history of breast cancer    Goals of care, counseling/discussion 01/18/2021   Prostate cancer ([place]) 01/14/2021   Vitiligo  10/04/2019   Vitamin D deficiency 12/28/2018   Right inguinal hernia 06/21/2018   Chronic kidney insufficiency, stage 2 (mild) 02/02/2018   Chronic bronchitis, simple ([place]) 12/28/2016   High risk medication use 12/24/2015   Cigarette smoker 12/24/2015   Benign essential hypertension 06/24/2014   Benign prostatic hyperplasia 06/24/2014   Back pain 06/24/2014   B12 deficiency 06/24/2014    Past Surgical History:  Procedure Laterality Date   CATARACT EXTRACTION W/PHACO Right 09/30/2015   Procedure: CATARACT EXTRACTION PHACO AND INTRAOCULAR LENS PLACEMENT ([place]);  Surgeon: Leandrew Koyanagi, MD;  Location: [place];  Service: Ophthalmology;  Laterality: Right;   COLONOSCOPY  2009?   COLONOSCOPY WITH PROPOFOL N/A 01/06/2021   Procedure: COLONOSCOPY WITH PROPOFOL;  Surgeon: Lin Landsman, MD;  Location: [facility] ENDOSCOPY;  Service: Gastroenterology;  Laterality: N/A;   HERNIA REPAIR Right    x2     Prior to Admission medications   Medication Sig Start Date End Date Taking? Authorizing Provider  amitriptyline (ELAVIL) 10 MG tablet 10 mg at bedtime. 09/11/20  Yes [provider]  amLODipine (NORVASC) 10 MG tablet Take 5 mg by mouth daily. 04/28/21  Yes [provider]  aspirin 81 MG tablet Take 81 mg by mouth daily. PM   Yes [provider]  carbidopa-levodopa (SINEMET IR) 25-100 MG tablet Take 1 tablet by mouth 3 (three) times daily. 06/11/21  Yes  [provider]  cyanocobalamin 500 MCG tablet Take 500 mcg by mouth daily. PM   Yes [provider]  finasteride (PROSCAR) 5 MG tablet TAKE 1 TABLET BY MOUTH EVERY DAY 06/11/21  Yes Stoioff, Ronda Fairly, MD  gabapentin (NEURONTIN) 100 MG capsule Take 100 mg by mouth 2 (two) times daily. 06/02/21  Yes [provider]  hydrochlorothiazide (HYDRODIURIL) 25 MG tablet Take 25 mg by mouth daily. PM   Yes [provider]  metoprolol succinate (TOPROL-XL) 50 MG 24 hr tablet Take 50 mg by  mouth daily. 08/03/20  Yes [provider]  mirtazapine (REMERON) 7.5 MG tablet Take 7.5 mg by mouth at bedtime. 04/06/21  Yes [provider]  tamsulosin (FLOMAX) 0.4 MG CAPS capsule TAKE 1 CAPSULE BY MOUTH EVERY DAY 09/30/20  Yes McGowan, Larene Beach A, PA-C  torsemide (DEMADEX) 10 MG tablet Take 1 tablet by mouth daily. 05/07/21 05/07/22 Yes [provider]  acetaminophen (TYLENOL) 500 MG tablet Take 500 mg by mouth every 4 (four) hours as needed.    [provider]  cetirizine (ZYRTEC) 10 MG tablet Take 10 mg by mouth daily as needed.    [provider]  Propylene Glycol (SYSTANE BALANCE) 0.6 % SOLN INSTILL 3 5 TIMES A DAY IN EACH EYE AS NEEDED FOR DRYNESS. 06/03/19   [provider]    Allergies Patient has no known allergies.  Family History  Problem Relation Age of Onset   Prostate cancer Brother    Alzheimer's disease Mother    Breast cancer Mother        dx 28 or 61   Stroke Father    Heart attack Daughter    Kidney cancer Neg Hx    Kidney disease Neg Hx    Bladder Cancer Neg Hx     Social History Social History   Tobacco Use   Smoking status: Every Day    Packs/day: 0.25    Years: 45.00    Pack years: 11.25    Types: Cigarettes   Smokeless tobacco: Never  Vaping Use   Vaping Use: Never used  Substance Use Topics   Alcohol use: Yes    Comment: drink beer maybe 2 - 3 weeks   Drug use: No    Review of Systems  Review of Systems  Constitutional:  Positive for fatigue. Negative for chills and fever.  HENT:  Negative for sore throat.   Respiratory:  Positive for cough and shortness of breath.   Cardiovascular:  Negative for chest pain.  Gastrointestinal:  Negative for abdominal pain.  Genitourinary:  Negative for flank pain.  Musculoskeletal:  Negative for neck pain.  Skin:  Negative for rash and wound.  Allergic/Immunologic: Negative for immunocompromised state.  Neurological:  Negative for weakness and numbness.   Hematological:  Does not bruise/bleed easily.  All other systems reviewed and are negative.   ____________________________________________  PHYSICAL EXAM:      VITAL SIGNS: ED Triage Vitals  Enc Vitals Group     BP 07/28/21 1029 137/89     Pulse Rate 07/28/21 1029 (!) 51     Resp 07/28/21 1029 (!) 21     Temp 07/28/21 1029 97.9 F (36.6 C)     Temp src --      SpO2 07/28/21 1029 94 %     Weight 07/28/21 1029 135 lb (61.2 kg)     Height 07/28/21 1029 6' (1.829 m)     Head Circumference --  Peak Flow --      Pain Score 07/28/21 1029 0     Pain Loc --      Pain Edu? --      Excl. in [place]? --      Physical Exam Vitals and nursing note reviewed.  Constitutional:      General: He is not in acute distress.    Appearance: He is well-developed.  HENT:     Head: Normocephalic and atraumatic.  Eyes:     Conjunctiva/sclera: Conjunctivae normal.  Cardiovascular:     Rate and Rhythm: Normal rate and regular rhythm.     Heart sounds: Normal heart sounds. No murmur heard.   No friction rub.  Pulmonary:     Effort: Pulmonary effort is normal. No respiratory distress.     Breath sounds: Examination of the right-middle field reveals wheezing. Examination of the left-middle field reveals wheezing. Examination of the right-lower field reveals wheezing. Examination of the left-lower field reveals wheezing. Wheezing present. No rales.  Abdominal:     General: There is no distension.     Palpations: Abdomen is soft.     Tenderness: There is no abdominal tenderness.  Musculoskeletal:     Cervical back: Neck supple.  Skin:    General: Skin is warm.     Capillary Refill: Capillary refill takes less than 2 seconds.  Neurological:     Mental Status: He is alert and oriented to person, place, and time.     Motor: No abnormal muscle tone.      ____________________________________________   LABS (all labs ordered are listed, but only abnormal results are displayed)  Labs Reviewed   BASIC METABOLIC PANEL - Abnormal; Notable for the following components:      Result Value   Chloride 96 (*)    Glucose, Bld 150 (*)    BUN 29 (*)    Creatinine, Ser 1.34 (*)    GFR, Estimated 52 (*)    All other components within normal limits  CBC - Abnormal; Notable for the following components:   RBC 6.09 (*)    Hemoglobin 17.6 (*)    HCT 54.2 (*)    Platelets 420 (*)    All other components within normal limits  D-DIMER, QUANTITATIVE - Abnormal; Notable for the following components:   D-Dimer, Quant 3.68 (*)    All other components within normal limits  BRAIN NATRIURETIC PEPTIDE - Abnormal; Notable for the following components:   B Natriuretic Peptide 123.8 (*)    All other components within normal limits  TROPONIN I (HIGH SENSITIVITY) - Abnormal; Notable for the following components:   Troponin I (High Sensitivity) 25 (*)    All other components within normal limits  TROPONIN I (HIGH SENSITIVITY) - Abnormal; Notable for the following components:   Troponin I (High Sensitivity) 33 (*)    All other components within normal limits  RESP PANEL BY RT-PCR (FLU A&B, COVID) ARPGX2  CULTURE, BLOOD (ROUTINE X 2)  CULTURE, BLOOD (ROUTINE X 2)  HIV ANTIBODY (ROUTINE TESTING W REFLEX)  BASIC METABOLIC PANEL  CBC    ____________________________________________  EKG: Undetermined rhythm, ventricular rate 107.  QRS 80, QTc 437.  Severely limited due to motion artifact so will repeat.  PVCs noted. ________________________________________  RADIOLOGY All imaging, including plain films, CT scans, and ultrasounds, independently reviewed by me, and interpretations confirmed via formal radiology reads.  ED MD interpretation:   CXR: No active disease CT Angio Chest: No PE, increased hazy bl pulmonary opacities,  likely early PNA, no dense consolidation   Official radiology report(s): DG Chest 2 View  Result Date: 07/28/2021 CLINICAL DATA:  Cough shortness of breath. Prostate cancer  history in this 87 year old male. EXAM: CHEST - 2 VIEW COMPARISON:  Examination of October 08, 2020. FINDINGS: Trachea midline. Cardiomediastinal contours and hilar structures are stable. No lobar consolidation or sign of pleural effusion. Lungs are hyperinflated mildly. Tortuous thoracic aorta with similar appearance. On limited assessment no acute skeletal process. No visible pneumothorax. Signs of skin folds projecting over the RIGHT mid chest. IMPRESSION: No active cardiopulmonary disease. Electronically Signed   By: Zetta Bills M.D.   On: 07/28/2021 12:36   CT Angio Chest PE W and/or Wo Contrast  Result Date: 07/28/2021 CLINICAL DATA:  Pulmonary emboli suspected, high probability. Shortness of breath. Prostate cancer. EXAM: CT ANGIOGRAPHY CHEST WITH CONTRAST TECHNIQUE: Multidetector CT imaging of the chest was performed using the standard protocol during bolus administration of intravenous contrast. Multiplanar CT image reconstructions and MIPs were obtained to evaluate the vascular anatomy. CONTRAST:  69m OMNIPAQUE IOHEXOL 350 MG/ML SOLN COMPARISON:  Chest radiography same day. FINDINGS: Cardiovascular: Heart size is normal. No pericardial fluid. No visible coronary artery calcification. Mild aortic atherosclerotic calcification and tortuosity. Pulmonary arterial opacification is good. There are no pulmonary emboli. Mediastinum/Nodes: No mediastinal or hilar mass or lymphadenopathy. Lungs/Pleura: No pleural effusion. Underlying emphysema and pulmonary scarring. There are scattered areas of hazy opacity in both lungs which were not present in February of this year, suggesting early pneumonia. No dense consolidation or lobar collapse. Upper Abdomen: Multiple cysts of the kidneys as seen previously. 1 cm liver cyst at the dome. Musculoskeletal: Scoliosis and degenerative change of the spine. Multiple sclerotic foci throughout the regional skeleton consistent with known osseous metastatic disease. This is  progressive since February. Review of the MIP images confirms the above findings. IMPRESSION: No pulmonary emboli. Aortic Atherosclerosis (ICD10-I70.0) and Emphysema (ICD10-J43.9). Increased hazy pulmonary opacities scattered within both lungs, not present in February, favored to represent mild/early pneumonia. No dense consolidation or lobar collapse. Progression of sclerotic osseous metastatic disease without evidence of fracture or lytic destructive lesion. Electronically Signed   By: MNelson ChimesM.D.   On: 07/28/2021 14:47    ____________________________________________  PROCEDURES   Procedure(s) performed (including Critical Care):  Procedures  ____________________________________________  INITIAL IMPRESSION / MDM / [place]/ ED COURSE  As part of my medical decision making, I reviewed the following data within the e[place]notes reviewed and incorporated, Old chart reviewed, Notes from prior ED visits, and  Controlled Substance Database       *RABBY DENISwas evaluated in Emergency Department on 07/28/2021 for the symptoms described in the history of present illness. He was evaluated in the context of the global COVID-19 pandemic, which necessitated consideration that the patient might be at risk for infection with the SARS-CoV-2 virus that causes COVID-19. Institutional protocols and algorithms that pertain to the evaluation of patients at risk for COVID-19 are in a state of rapid change based on information released by regulatory bodies including the CDC and federal and state organizations. These policies and algorithms were followed during the patient's care in the ED.  Some ED evaluations and interventions may be delayed as a result of limited staffing during the pandemic.*     Medical Decision Making:  87yo M here with SOB, cough, acute hypoxic resp failure. Labs, imaging as above. CBC without significant leukocytosis. BMP with  possible  mild dehydration. Trop, BNP slightly elevated likely 2/2 demand - EKG nonischemic and pt denies any CP. Pt with new onset hypoxia and wheezing with known malignancy, so D-Dimer sent and is positive. CT Angio obtained, reviewed, shows no PE but does show likely early PNA. Suspect acute hypoxia 2/2 CAP, less likely edema. Will admit for further management. Pt does not appear septic and is satting well on 2L [place].  ____________________________________________  FINAL CLINICAL IMPRESSION(S) / ED DIAGNOSES  Final diagnoses:  Community acquired pneumonia, unspecified laterality  Acute respiratory failure with hypoxia ([place])     MEDICATIONS GIVEN DURING THIS VISIT:  Medications  amLODipine (NORVASC) tablet 5 mg (5 mg Oral Given 07/28/21 1818)  acetaminophen (TYLENOL) tablet 500 mg (has no administration in time range)  aspirin EC tablet 81 mg (81 mg Oral Given 07/28/21 1818)  metoprolol succinate (TOPROL-XL) 24 hr tablet 50 mg (50 mg Oral Given 07/28/21 1818)  amitriptyline (ELAVIL) tablet 10 mg (has no administration in time range)  mirtazapine (REMERON) tablet 7.5 mg (has no administration in time range)  finasteride (PROSCAR) tablet 5 mg (has no administration in time range)  tamsulosin (FLOMAX) capsule 0.4 mg (0.4 mg Oral Given 07/28/21 1817)  vitamin B-12 (CYANOCOBALAMIN) tablet 500 mcg (has no administration in time range)  carbidopa-levodopa (SINEMET IR) 25-100 MG per tablet immediate release 1 tablet (has no administration in time range)  gabapentin (NEURONTIN) capsule 100 mg (100 mg Oral Given 07/28/21 1817)  enoxaparin (LOVENOX) injection 40 mg (has no administration in time range)  cefTRIAXone (ROCEPHIN) 2 g in sodium chloride 0.9 % 100 mL IVPB (2 g Intravenous New Bag/Given 07/28/21 1811)  azithromycin (ZITHROMAX) 500 mg in sodium chloride 0.9 % 250 mL IVPB (500 mg Intravenous New Bag/Given 07/28/21 1857)  methylPREDNISolone sodium succinate (SOLU-MEDROL) 40 mg/mL injection 40 mg (40 mg  Intravenous Given 07/28/21 1808)    Followed by  predniSONE (DELTASONE) tablet 40 mg (has no administration in time range)  ipratropium-albuterol (DUONEB) 0.5-2.5 (3) MG/3ML nebulizer solution 3 mL (has no administration in time range)  ipratropium-albuterol (DUONEB) 0.5-2.5 (3) MG/3ML nebulizer solution 3 mL (3 mLs Nebulization Given 07/28/21 1231)  iohexol (OMNIPAQUE) 350 MG/ML injection 75 mL (75 mLs Intravenous Contrast Given 07/28/21 1359)  ipratropium-albuterol (DUONEB) 0.5-2.5 (3) MG/3ML nebulizer solution 3 mL (3 mLs Nebulization Given 07/28/21 1533)  methylPREDNISolone sodium succinate (SOLU-MEDROL) 125 mg/2 mL injection 125 mg (125 mg Intravenous Given 07/28/21 1522)     ED Discharge Orders     None        Note:  This document was prepared using Dragon voice recognition software and may include unintentional dictation errors.   Duffy Bruce, MD 07/28/21 Pauline [place]

## 2021-07-28 NOTE — H&P (Addendum)
History and Physical    Jacob [name] [id number] DOB: 20-Oct-1934 DOA: 07/28/2021  PCP: [name], MD (Inactive)   Patient coming from: Home  I have personally briefly reviewed patient's old medical records in [place]  Chief Complaint: Shortness of breath  HPI: Jacob [name] is a 87 y.o. male with medical history significant for prostate cancer who presents to the emergency room from [place] clinic for evaluation of shortness of breath about 3 days. Patient states that he has shortness of breath which initially started with exertion but now he is short of breath at rest.  While at the urgent care he was found to have room air pulse oximetry of 84% and was placed on 4 L of oxygen and sent to the ER for evaluation. He has a nonproductive cough and had wheezing upon arrival to the ER.  He denies having any fever or chills, no headache, no chest pain, no nausea, no vomiting, no abdominal pain, no dizziness, no lightheadedness, no weakness, no blurred vision no focal deficits. Labs show sodium 140, potassium 3.7, chloride 96, bicarb 30, glucose 150, BUN 29, creatinine 1.34, calcium 9.5, BNP 123, troponin 25 >> 33, white count 8.8, hemoglobin 17.6, hematocrit 54.2, MCV 89.0, RDW 13.4, platelet count 420, D-dimer 3.68 Chest x-ray reviewed by me shows no active cardiopulmonary disease. CT angiogram of the chest is negative for pulmonary emboli but shows aortic atherosclerosis and emphysema.  Increased hazy pulmonary opacities scattered within both lungs favored to represent a mild/early pneumonia.  Progression of sclerotic osseous metastatic disease without evidence of fracture or lytic destructive lesion. Twelve-lead EKG reviewed by me shows sinus rhythm with probable anteroseptal infarct.    ED Course: Patient is an 87 year old male with a history of emphysema and prostate cancer who presents to the ER from urgent care for evaluation of worsening shortness of breath, nonproductive cough  and wheezing.  Imaging suggestive of possible early pneumonia. Patient will be admitted to the hospital for further evaluation.    Review of Systems: As per HPI otherwise all other systems reviewed and negative.    Past Medical History:  Diagnosis Date   BPH (benign prostatic hyperplasia)    Cancer (HCC)    Chronic constipation 06/25/2015   COPD (chronic obstructive pulmonary disease) ([place])    pt denies.   Family history of breast cancer    Family history of prostate cancer    Hypertension    Swelling of lower extremity    bilateral   Wears dentures    full upper and lower - do not fit well.    Past Surgical History:  Procedure Laterality Date   CATARACT EXTRACTION W/PHACO Right 09/30/2015   Procedure: CATARACT EXTRACTION PHACO AND INTRAOCULAR LENS PLACEMENT (IOC);  Surgeon: Leandrew Koyanagi, MD;  Location: [facility];  Service: Ophthalmology;  Laterality: Right;   COLONOSCOPY  2009?   COLONOSCOPY WITH PROPOFOL N/A 01/06/2021   Procedure: COLONOSCOPY WITH PROPOFOL;  Surgeon: Lin Landsman, MD;  [facility] ENDOSCOPY;  Service: Gastroenterology;  Laterality: N/A;   HERNIA REPAIR Right    x2      reports that he has been smoking cigarettes. He has a 11.25 pack-year smoking history. He has never used smokeless tobacco. He reports current alcohol use. He reports that he does not use drugs.  No Known Allergies  Family History  Problem Relation Age of Onset   Prostate cancer Brother    Alzheimer's disease Mother    Breast cancer Mother  dx 26 or 7   Stroke Father    Heart attack Daughter    Kidney cancer Neg Hx    Kidney disease Neg Hx    Bladder Cancer Neg Hx       Prior to Admission medications   Medication Sig Start Date End Date Taking? Authorizing Provider  amitriptyline (ELAVIL) 10 MG tablet 10 mg at bedtime. 09/11/20  Yes [provider]  amLODipine (NORVASC) 10 MG tablet Take 5 mg by mouth daily. 04/28/21  Yes [provider]  aspirin 81 MG tablet Take 81 mg by mouth daily. PM   Yes [provider]  carbidopa-levodopa (SINEMET IR) 25-100 MG tablet Take 1 tablet by mouth 3 (three) times daily. 06/11/21  Yes [provider]  cyanocobalamin 500 MCG tablet Take 500 mcg by mouth daily. PM   Yes [provider]  finasteride (PROSCAR) 5 MG tablet TAKE 1 TABLET BY MOUTH EVERY DAY 06/11/21  Yes Stoioff, Ronda Fairly, MD  gabapentin (NEURONTIN) 100 MG capsule Take 100 mg by mouth 2 (two) times daily. 06/02/21  Yes [provider]  hydrochlorothiazide (HYDRODIURIL) 25 MG tablet Take 25 mg by mouth daily. PM   Yes [provider]  metoprolol succinate (TOPROL-XL) 50 MG 24 hr tablet Take 50 mg by mouth daily. 08/03/20  Yes [provider]  mirtazapine (REMERON) 7.5 MG tablet Take 7.5 mg by mouth at bedtime. 04/06/21  Yes [provider]  tamsulosin (FLOMAX) 0.4 MG CAPS capsule TAKE 1 CAPSULE BY MOUTH EVERY DAY 09/30/20  Yes McGowan, Larene Beach A, PA-C  torsemide (DEMADEX) 10 MG tablet Take 1 tablet by mouth daily. 05/07/21 05/07/22 Yes [provider]  acetaminophen (TYLENOL) 500 MG tablet Take 500 mg by mouth every 4 (four) hours as needed.    [provider]  cetirizine (ZYRTEC) 10 MG tablet Take 10 mg by mouth daily as needed.    [provider]  Propylene Glycol (SYSTANE BALANCE) 0.6 % SOLN INSTILL 3 5 TIMES A DAY IN [facility] EYE AS NEEDED FOR DRYNESS. 06/03/19   [provider]    Physical Exam: Vitals:   07/28/21 1029 07/28/21 1230 07/28/21 1300 07/28/21 1330  BP: 137/89 (!) 130/92 116/78 118/76  Pulse: (!) 51 92 91 91  Resp: (!) 21 (!) 23 (!) 25 (!) 23  Temp: 97.9 F (36.6 C)     SpO2: 94% 96% 93% 95%  Weight:      Height:         Vitals:   07/28/21 1029 07/28/21 1230 07/28/21 1300 07/28/21 1330  BP: 137/89 (!) 130/92 116/78 118/76  Pulse: (!) 51 92 91 91  Resp: (!) 21 (!) 23 (!) 25 (!) 23  Temp: 97.9 F (36.6 C)      SpO2: 94% 96% 93% 95%  Weight:      Height:          Constitutional: Alert and oriented x 3 . Not in any apparent distress.  Chronically ill-appearing HEENT:      Head: Normocephalic and atraumatic.         Eyes: PERLA, EOMI, Conjunctivae are normal. Sclera is non-icteric.       Mouth/Throat: Mucous membranes are moist.       Neck: Supple with no signs of meningismus. Cardiovascular: Regular rate and rhythm. No murmurs, gallops, or rubs. 2+ symmetrical distal pulses are present . No JVD. 1+ LE edema Respiratory: Respiratory effort normal .  Air entry in both lung fields. Faint wheezes, crackles,  or rhonchi.  Gastrointestinal: Soft, non tender, and non distended with positive bowel sounds.  Genitourinary: No CVA tenderness. Musculoskeletal: Nontender with normal range of motion in all extremities. No cyanosis, or erythema of extremities. Neurologic:  Face is symmetric. Moving all extremities. No gross focal neurologic deficits . Skin: Skin is warm, dry.  No rash or ulcers Psychiatric: Depressed mood and flat affect    Labs on Admission: I have personally reviewed following labs and imaging studies  CBC: Recent Labs  Lab 07/28/21 1032  WBC 8.8  HGB 17.6*  HCT 54.2*  MCV 89.0  PLT 0000000*   Basic Metabolic Panel: Recent Labs  Lab 07/28/21 1032  NA 140  K 3.7  CL 96*  CO2 30  GLUCOSE 150*  BUN 29*  CREATININE 1.34*  CALCIUM 9.5   GFR: Estimated Creatinine Clearance: 34.9 mL/min (A) (by C-G formula based on SCr of 1.34 mg/dL (H)). Liver Function Tests: No results for input(s): AST, ALT, ALKPHOS, BILITOT, PROT, ALBUMIN in the last 168 hours. No results for input(s): LIPASE, AMYLASE in the last 168 hours. No results for input(s): AMMONIA in the last 168 hours. Coagulation Profile: No results for input(s): INR, PROTIME in the last 168 hours. Cardiac Enzymes: No results for input(s): CKTOTAL, CKMB, CKMBINDEX, TROPONINI in the last 168 hours. BNP (last 3 results) No  results for input(s): PROBNP in the last 8760 hours. HbA1C: No results for input(s): HGBA1C in the last 72 hours. CBG: No results for input(s): GLUCAP in the last 168 hours. Lipid Profile: No results for input(s): CHOL, HDL, LDLCALC, TRIG, CHOLHDL, LDLDIRECT in the last 72 hours. Thyroid Function Tests: No results for input(s): TSH, T4TOTAL, FREET4, T3FREE, THYROIDAB in the last 72 hours. Anemia Panel: No results for input(s): VITAMINB12, FOLATE, FERRITIN, TIBC, IRON, RETICCTPCT in the last 72 hours. Urine analysis:    Component Value Date/Time   COLORURINE YELLOW 12/30/2016 1525   APPEARANCEUR Clear 09/25/2020 1314   LABSPEC 1.020 12/30/2016 1525   LABSPEC 1.011 11/17/2012 1216   PHURINE 7.0 12/30/2016 1525   GLUCOSEU Negative 09/25/2020 1314   GLUCOSEU Negative 11/17/2012 1216   HGBUR TRACE (A) 12/30/2016 1525   BILIRUBINUR Negative 09/25/2020 1314   BILIRUBINUR Negative 11/17/2012 1216   KETONESUR NEGATIVE 12/30/2016 1525   PROTEINUR Negative 09/25/2020 1314   PROTEINUR TRACE (A) 12/30/2016 1525   NITRITE Negative 09/25/2020 1314   NITRITE NEGATIVE 12/30/2016 1525   LEUKOCYTESUR Negative 09/25/2020 1314   LEUKOCYTESUR Negative 11/17/2012 1216    Radiological Exams on Admission: DG Chest 2 View  Result Date: 07/28/2021 CLINICAL DATA:  Cough shortness of breath. Prostate cancer history in this 87 year old male. EXAM: CHEST - 2 VIEW COMPARISON:  Examination of October 08, 2020. FINDINGS: Trachea midline. Cardiomediastinal contours and hilar structures are stable. No lobar consolidation or sign of pleural effusion. Lungs are hyperinflated mildly. Tortuous thoracic aorta with similar appearance. On limited assessment no acute skeletal process. No visible pneumothorax. Signs of skin folds projecting over the RIGHT mid chest. IMPRESSION: No active cardiopulmonary disease. Electronically Signed   By: Zetta Bills M.D.   On: 07/28/2021 12:36   CT Angio Chest PE W and/or Wo  Contrast  Result Date: 07/28/2021 CLINICAL DATA:  Pulmonary emboli suspected, high probability. Shortness of breath. Prostate cancer. EXAM: CT ANGIOGRAPHY CHEST WITH CONTRAST TECHNIQUE: Multidetector CT imaging of the chest was performed using the standard protocol during bolus administration of intravenous contrast. Multiplanar CT image reconstructions and MIPs were obtained to evaluate the vascular anatomy. CONTRAST:  26m OMNIPAQUE IOHEXOL 350 MG/ML SOLN COMPARISON:  Chest radiography same day. FINDINGS: Cardiovascular: Heart size is normal. No pericardial fluid. No visible coronary artery calcification. Mild aortic atherosclerotic calcification and tortuosity. Pulmonary arterial opacification is good. There are no pulmonary emboli. Mediastinum/Nodes: No mediastinal or hilar mass or lymphadenopathy. Lungs/Pleura: No pleural effusion. Underlying emphysema and pulmonary scarring. There are scattered areas of hazy opacity in both lungs which were not present in February of this year, suggesting early pneumonia. No dense consolidation or lobar collapse. Upper Abdomen: Multiple cysts of the kidneys as seen previously. 1 cm liver cyst at the dome. Musculoskeletal: Scoliosis and degenerative change of the spine. Multiple sclerotic foci throughout the regional skeleton consistent with known osseous metastatic disease. This is progressive since February. Review of the MIP images confirms the above findings. IMPRESSION: No pulmonary emboli. Aortic Atherosclerosis (ICD10-I70.0) and Emphysema (ICD10-J43.9). Increased hazy pulmonary opacities scattered within both lungs, not present in February, favored to represent mild/early pneumonia. No dense consolidation or lobar collapse. Progression of sclerotic osseous metastatic disease without evidence of fracture or lytic destructive lesion. Electronically Signed   By: MNelson ChimesM.D.   On: 07/28/2021 14:47     Assessment/Plan Principal Problem:   CAP (community acquired  pneumonia) Active Problems:   Benign essential hypertension   [place]state cancer ([place]   Parkinson disease ([place]   Depression      Community-acquired pneumonia We will treat patient empirically with Rocephin and Zithromax Follow-up results of blood cultures    Hypertension Continue amlodipine and metoprolol    COPD with acute exacerbation Patient presents for evaluation of worsening shortness of breath and was found to be hypoxic with room air pulse oximetry of 84% He is currently on 2 L of oxygen with pulse oximetry of about 92% Continue as needed bronchodilator therapy Continue inhaled steroids    History of prostate cancer Continue finasteride and Flomax    Parkinson's disease Continue Sinemet    Depression Continue Remeron  DVT prophylaxis: Lovenox  Code Status: full code  Family Communication: Greater than 50% of time was spent discussing plan of care with patient at the bedside.  CODE STATUS was discussed and he wishes to be a full code he lists his son TAires Lemas his healthcare power of attorney.  All questions and concerns have been addressed he verbalizes understanding and agrees with the plan. Disposition Plan: Back to previous home environment Consults called: none  Status: At the time of admission, it appears that the appropriate admission status for this patient is inpatient. This is judged to be reasonable and necessary in order to provide the required intensity of service to ensure the patient's safety given the presenting symptoms, physical exam findings, and initial radiographic and laboratory data in the context of their comorbid conditions. Patient requires inpatient status due to high intensity of service, high risk for further deterioration and high frequency of surveillance required.    TCollier BullockMD [facility]     07/28/2021, 4:50 PM

## 2021-07-28 NOTE — ED Triage Notes (Signed)
Pt comes into the ED via [facility] c/o [place].  Pt currently is a protsate cancer patient that is under palliative care.  Pt drove himself in today for [facility]  Pt initially had saturation levels at 84% RA and he was placed on 4L by [facility] clinic.  Pt has wheezing present in triage.  Pt does admit to a dry cough for a couple days, but denies any pain.

## 2021-07-28 NOTE — ED Notes (Signed)
Contacted dietary about sending food tray

## 2021-07-29 DIAGNOSIS — J9601 Acute respiratory failure with hypoxia: Secondary | ICD-10-CM

## 2021-07-29 DIAGNOSIS — C61 Malignant neoplasm of prostate: Secondary | ICD-10-CM | POA: Diagnosis not present

## 2021-07-29 DIAGNOSIS — J441 Chronic obstructive pulmonary disease with (acute) exacerbation: Secondary | ICD-10-CM

## 2021-07-29 DIAGNOSIS — J181 Lobar pneumonia, unspecified organism: Secondary | ICD-10-CM

## 2021-07-29 DIAGNOSIS — R531 Weakness: Secondary | ICD-10-CM

## 2021-07-29 DIAGNOSIS — E43 Unspecified severe protein-calorie malnutrition: Secondary | ICD-10-CM | POA: Insufficient documentation

## 2021-07-29 DIAGNOSIS — N400 Enlarged prostate without lower urinary tract symptoms: Secondary | ICD-10-CM

## 2021-07-29 LAB — BASIC METABOLIC PANEL
Anion gap: 11 (ref 5–15)
BUN: 26 mg/dL — ABNORMAL HIGH (ref 8–23)
CO2: 33 mmol/L — ABNORMAL HIGH (ref 22–32)
Calcium: 9.3 mg/dL (ref 8.9–10.3)
Chloride: 97 mmol/L — ABNORMAL LOW (ref 98–111)
Creatinine, Ser: 1.2 mg/dL (ref 0.61–1.24)
GFR, Estimated: 59 mL/min — ABNORMAL LOW (ref >60)
Glucose, Bld: 128 mg/dL — ABNORMAL HIGH (ref 70–99)
Potassium: 4.1 mmol/L (ref 3.5–5.1)
Sodium: 141 mmol/L (ref 135–145)

## 2021-07-29 LAB — CBC
HCT: 46.2 % (ref 39.0–52.0)
Hemoglobin: 15.3 g/dL (ref 13.0–17.0)
MCH: 29.5 pg (ref 26.0–34.0)
MCHC: 33.1 g/dL (ref 30.0–36.0)
MCV: 89.2 fL (ref 80.0–100.0)
Platelets: 344 10*3/uL (ref 150–400)
RBC: 5.18 MIL/uL (ref 4.22–5.81)
RDW: 13.6 % (ref 11.5–15.5)
WBC: 16.1 10*3/uL — ABNORMAL HIGH (ref 4.0–10.5)
nRBC: 0 % (ref 0.0–0.2)

## 2021-07-29 LAB — HIV ANTIBODY (ROUTINE TESTING W REFLEX): HIV Screen 4th Generation wRfx: NONREACTIVE

## 2021-07-29 LAB — PSA: Prostatic Specific Antigen: 201 ng/mL — ABNORMAL HIGH (ref 0.00–4.00)

## 2021-07-29 LAB — VITAMIN D 25 HYDROXY (VIT D DEFICIENCY, FRACTURES): Vit D, 25-Hydroxy: 44.73 ng/mL (ref 30–100)

## 2021-07-29 MED ORDER — ADULT MULTIVITAMIN W/MINERALS CH
1.0000 | ORAL_TABLET | Freq: Every day | ORAL | Status: DC
  Administered 2021-07-29 – 2021-08-03 (×6): 1 via ORAL
  Filled 2021-07-29 (×6): qty 1

## 2021-07-29 MED ORDER — ORAL CARE MOUTH RINSE
15.0000 mL | Freq: Two times a day (BID) | OROMUCOSAL | Status: DC
  Administered 2021-07-29 – 2021-08-02 (×6): 15 mL via OROMUCOSAL

## 2021-07-29 MED ORDER — POLYETHYLENE GLYCOL 3350 17 G PO PACK
17.0000 g | PACK | Freq: Every day | ORAL | Status: DC
  Administered 2021-07-29 – 2021-07-31 (×3): 17 g via ORAL
  Filled 2021-07-29 (×4): qty 1

## 2021-07-29 MED ORDER — ENSURE ENLIVE PO LIQD
237.0000 mL | Freq: Three times a day (TID) | ORAL | Status: DC
  Administered 2021-07-29 – 2021-08-01 (×7): 237 mL via ORAL

## 2021-07-29 NOTE — Evaluation (Signed)
Physical Therapy Evaluation Patient Details Name: Jacob [name] MRN: [id number] DOB: Sep 19, 1934 Today's Date: 07/29/2021   History of Present Illness  Patient is an 87 year old male with a history of emphysema and prostate cancer who presents to the ER from urgent care for evaluation of worsening shortness of breath, nonproductive cough and wheezing.  Imaging suggestive of possible early pneumonia  Clinical Impression  Patient is agreeable to PT. He reports he lives alone and would have limited assistance from family at home. He walks with a cane intermittently and has had many recent falls.  Patient has generalized weakness and needs assistance with mobility. Patient required minimal assistance for standing and for ambulating a short distance in room without assistive device. Patient would benefit from assistive device with ambulation for fall prevention. Given current mobility status and multiple falls at home recently, SNF is recommended for short term rehab. Discussed recommendations with the patient. Recommend to continue PT to maximize independence and facilitate return to prior level of function.   Orthostatic vitals taken: supine 104/71, sitting 118/64, standing 109/66. No dizziness reported with activity.     Follow Up Recommendations SNF    Equipment Recommendations  Rolling walker with 5" wheels    Recommendations for Other Services       Precautions / Restrictions Precautions Precautions: Fall Restrictions Weight Bearing Restrictions: No      Mobility  Bed Mobility Overal bed mobility: Needs Assistance Bed Mobility: Supine to Sit;Sit to Supine     Supine to sit: Min guard Sit to supine: Min assist   General bed mobility comments: assistance for LE support to return to bed. verbal cues for technique and sequencing. no dizziness is reported with upright activity. Sp02 94% on room air with mobility efforts    Transfers Overall transfer level: Needs  assistance Equipment used: None Transfers: Sit to/from Stand Sit to Stand: Min assist         General transfer comment: steadying assistance provided with transfers. verbal cues for hand placement  Ambulation/Gait Ambulation/Gait assistance: Min assist Gait Distance (Feet): 20 Feet Assistive device: 1 person hand held assist Gait Pattern/deviations: Decreased stride length;Narrow base of support;Trunk flexed Gait velocity: decreased   General Gait Details: patient declined using rolling walker, however would benefit for improved balance and safety with ambulation efforts. patient is generally unsteady and reports multiple falls at home recently. cues for safety provided whjile walking.  Stairs            Wheelchair Mobility    Modified Rankin (Stroke Patients Only)       Balance Overall balance assessment: History of Falls;Needs assistance Sitting-balance support: Feet supported Sitting balance-Leahy Scale: Good     Standing balance support: Single extremity supported;During functional activity Standing balance-Leahy Scale: Poor Standing balance comment: Min A required to maintain standing balance with dynamic activity                             Pertinent Vitals/Pain Pain Assessment: No/denies pain    Home Living Family/patient expects to be discharged to:: Private residence Living Arrangements: Alone Available Help at Discharge: Family;Available PRN/intermittently Type of Home: House Home Access: Stairs to enter Entrance Stairs-Rails: None Entrance Stairs-Number of Steps: 2-3 Home Layout: One level Home Equipment: Kasandra Knudsen - single point (reports he wife has a walker?) Additional Comments: patient states he has  oxygen at home but does not use it currently. he also reported multiple falls at home  recently (two falls this week)    Prior Function Level of Independence: Independent with assistive device(s)         Comments: patient  intermittently uses a cane for ambulation, independent with ADLs, drives     Hand Dominance        Extremity/Trunk Assessment   Upper Extremity Assessment Upper Extremity Assessment: Generalized weakness    Lower Extremity Assessment Lower Extremity Assessment: Generalized weakness       Communication   Communication: HOH  Cognition Arousal/Alertness: Awake/alert Behavior During Therapy: WFL for tasks assessed/performed Overall Cognitive Status: No family/caregiver present to determine baseline cognitive functioning                                 General Comments: patient is able to follow single step commands consistently. seems to have decreased insight for need for assistance      General Comments General comments (skin integrity, edema, etc.): orthostatic vitals taken and entered in vitals sign flowsheet. please see for details.    Exercises     Assessment/Plan    PT Assessment Patient needs continued PT services  PT Problem List Decreased strength;Decreased activity tolerance;Decreased balance;Decreased mobility;Decreased safety awareness;Decreased knowledge of use of DME       PT Treatment Interventions DME instruction;Gait training;Stair training;Functional mobility training;Therapeutic exercise;Therapeutic activities;Balance training;Neuromuscular re-education;Patient/family education    PT Goals (Current goals can be found in the Care Plan section)  Acute Rehab PT Goals Patient Stated Goal: to feel stonger and get better PT Goal Formulation: With patient Time For Goal Achievement: 08/12/21 Potential to Achieve Goals: Fair    Frequency Min 2X/week   Barriers to discharge Decreased caregiver support patient reports he has sons, however they will be unlikely assisting him at home. wife is in a nursing home.    Co-evaluation               AM-PAC PT "6 Clicks" Mobility  Outcome Measure Help needed turning from your back to your side  while in a flat bed without using bedrails?: A Little Help needed moving from lying on your back to sitting on the side of a flat bed without using bedrails?: A Little Help needed moving to and from a bed to a chair (including a wheelchair)?: A Little Help needed standing up from a chair using your arms (e.g., wheelchair or bedside chair)?: A Little Help needed to walk in hospital room?: A Little Help needed climbing 3-5 steps with a railing? : A Lot 6 Click Score: 17    End of Session   Activity Tolerance: Patient tolerated treatment well Patient left: in bed;with call bell/phone within reach;with bed alarm set Nurse Communication: Mobility status;Precautions (alerted nurse of patient report of bed bugs in his home. no bugs seen during this PT evaluation.) PT Visit Diagnosis: Muscle weakness (generalized) (M62.81);Unsteadiness on feet (R26.81);Repeated falls (R29.6)    Time: [id number] PT Time Calculation (min) (ACUTE ONLY): 31 min   Charges:   PT Evaluation $PT Eval Moderate Complexity: 1 Mod PT Treatments $Therapeutic Activity: 8-22 mins        Minna Merritts, PT, MPT   Percell Locus 07/29/2021, 11:49 AM

## 2021-07-29 NOTE — Progress Notes (Signed)
Patient ID: Jacob [name], male   DOB: 1934/07/11, 87 y.o.   MRN: [id number] [facility] PROGRESS NOTE  Jacob [name] [id number] DOB: [birth year] DOA: 07/28/2021 PCP: [name], MD (Inactive)  HPI/Subjective: Patient had quite a few falls at home.  Admitted with pneumonia.  Some cough.  No fever.  Feels weak.  History of metastatic prostate cancer.  Patient does not wear oxygen at home.  Objective: Vitals:   07/29/21 0624 07/29/21 0748  BP: 123/79 105/79  Pulse: 66 71  Resp: 20   Temp: 97.7 F (36.5 C) 97.9 F (36.6 C)  SpO2: 98% 99%    Intake/Output Summary (Last 24 hours) at 07/29/2021 1409 Last data filed at 07/29/2021 0900 Gross per 24 hour  Intake 470 ml  Output --  Net 470 ml   Filed Weights   07/28/21 1029  Weight: 61.2 kg    ROS: Review of Systems  Respiratory:  Positive for cough and shortness of breath.   Cardiovascular:  Negative for chest pain.  Gastrointestinal:  Negative for abdominal pain, nausea and vomiting.  Exam: Physical Exam HENT:     Head: Normocephalic.     Mouth/Throat:     Pharynx: No oropharyngeal exudate.  Eyes:     General: Lids are normal.     Conjunctiva/sclera: Conjunctivae normal.     Pupils: Pupils are equal, round, and reactive to light.  Cardiovascular:     Rate and Rhythm: Normal rate and regular rhythm.     Heart sounds: Normal heart sounds, S1 normal and S2 normal.  Pulmonary:     Breath sounds: Examination of the right-lower field reveals decreased breath sounds and rales. Examination of the left-lower field reveals decreased breath sounds and rales. Decreased breath sounds and rales present. No wheezing or rhonchi.  Abdominal:     Palpations: Abdomen is soft.     Tenderness: There is no abdominal tenderness.  Musculoskeletal:     Right ankle: No swelling.     Left ankle: No swelling.  Skin:    General: Skin is warm.     Findings: No rash.  Neurological:     Mental Status: He is alert and oriented to person,  place, and time.      Scheduled Meds:  amitriptyline  10 mg Oral QHS   amLODipine  5 mg Oral Daily   aspirin EC  81 mg Oral Daily   carbidopa-levodopa  1 tablet Oral TID   enoxaparin (LOVENOX) injection  40 mg Subcutaneous Q24H   finasteride  5 mg Oral Daily   gabapentin  100 mg Oral BID   mouth rinse  15 mL Mouth Rinse BID   metoprolol succinate  50 mg Oral Daily   mirtazapine  7.5 mg Oral QHS   polyethylene glycol  17 g Oral Daily   predniSONE  40 mg Oral Q breakfast   tamsulosin  0.4 mg Oral Daily   vitamin B-12  500 mcg Oral QHS   Continuous Infusions:  azithromycin Stopped (07/28/21 1957)   cefTRIAXone (ROCEPHIN)  IV Stopped (07/28/21 1841)    Assessment/Plan:  Acute hypoxic respiratory failure with a pulse ox 84% on admission.  Try to taper off oxygen.  He does not wear oxygen at home. Bilateral lobar pneumonia.  Continue Rocephin and Zithromax COPD exacerbation was on Solu-Medrol yesterday and on prednisone today Essential hypertension on Norvasc, metoprolol Metastatic prostate cancer to bone.  Looks like this is already known by his oncologist in the past with a positive bone  scan a few months ago. Weakness.  Physical therapy recommending rehab BPH on Flomax        Code Status:     Code Status Orders  (From admission, onward)           Start     Ordered   07/28/21 1525  Full code  Continuous        07/28/21 1530           Code Status History     This patient has a current code status but no historical code status.      Family Communication: Called son and given update. Disposition Plan: Status is: Inpatient  Dispo: The patient is from: Home              Anticipated d/c is to: Rehab              Patient currently being treated for bilateral pneumonia   Difficult to place patient.  No.  Antibiotics: Rocephin Zithromax  Time spent: 28 minutes  [place]

## 2021-07-29 NOTE — Progress Notes (Signed)
Initial Nutrition Assessment  DOCUMENTATION CODES:  Severe malnutrition in context of chronic illness, Underweight  INTERVENTION:  Obtain updated weight.  Add Ensure Plus po TID, each supplement provides 350 kcal and 13 grams of protein.   Add Magic cup TID with meals, each supplement provides 290 kcal and 9 grams of protein.  Add MVI with minerals daily.  NUTRITION DIAGNOSIS:  Severe Malnutrition related to chronic illness (Parkinson's disease) as evidenced by severe fat depletion, severe muscle depletion.  GOAL:  Patient will meet greater than or equal to 90% of their needs  MONITOR:  PO intake, Supplement acceptance, Diet advancement, Labs, Weight trends, I & O's  REASON FOR ASSESSMENT:  Consult Assessment of nutrition requirement/status  ASSESSMENT:  87 yo male with a PMH of benign essential HTN, COPD, prostate cancer, Parkinson's disease, and depression who presents with CAP and acute COPD exacerbation.  Spoke with pt at bedside. Pt reports eating well here and at home. Observed lunch tray 100% eaten. He reports that he is working on weight gain.  Per Epic, pt's weight appears stable over the past 8.5 months, ranging between 58-61 kg. Of note, pt has moderate edema, likely adding to weight. Also, pt's weight appears to be stated. RD to order measured weight.  On exam, pt severely depleted. Pt with edema in legs, possibly masking loss.  Recommend adding Ensure Enlive TID, Magic Cup TID, and MVI with minerals daily.  Medications: reviewed; Sinemet, Remeron, Prednisone, Vitamin B12  Labs: reviewed; Glucose 128 (H)  NUTRITION - FOCUSED PHYSICAL EXAM: Flowsheet Row Most Recent Value  Orbital Region Severe depletion  Upper Arm Region Severe depletion  Thoracic and Lumbar Region Severe depletion  Buccal Region Severe depletion  Temple Region Severe depletion  Clavicle Bone Region Severe depletion  Clavicle and Acromion Bone Region Severe depletion  Scapular Bone  Region Severe depletion  Dorsal Hand Severe depletion  Patellar Region Moderate depletion  Anterior Thigh Region Moderate depletion  Posterior Calf Region Moderate depletion  Edema (RD Assessment) None  Hair Reviewed  Eyes Reviewed  Mouth Reviewed  Skin Reviewed  Nails Reviewed   Diet Order:   Diet Order             DIET SOFT Room service appropriate? Yes; Fluid consistency: Thin  Diet effective now                  EDUCATION NEEDS:  Education needs have been addressed  Skin:  Skin Assessment: Reviewed RN Assessment  Last BM:  07/27/21  Height:  Ht Readings from Last 1 Encounters:  07/28/21 6' (1.829 m)   Weight:  Wt Readings from Last 1 Encounters:  07/28/21 61.2 kg   BMI:  Body mass index is 18.31 kg/m.  Estimated Nutritional Needs:  Kcal:  2100-2300 Protein:  85-100 grams Fluid:  >2.1 L  Derrel Nip, RD, LDN (she/her/hers) Registered Dietitian I After-Hours/Weekend Pager # in [place]

## 2021-07-29 NOTE — Evaluation (Signed)
Occupational Therapy Evaluation Patient Details Name: Jacob [name] MRN: [id number] DOB: 1934-11-28 Today's Date: 07/29/2021    History of Present Illness Patient is an 87 year old male with a history of emphysema and prostate cancer who presents to the ER from urgent care for evaluation of worsening shortness of breath, nonproductive cough and wheezing.  Imaging suggestive of possible early pneumonia   Clinical Impression   Pt seen for OT evaluation this date in setting of acute hospitalization d/t PNA. Pt reports being INDEP at baseline with ADLs, ADL mobility and IADLs including driving/errands. Pt reports that he lives alone with a son that is involved in his care and reports that his spouse is currently in a facility for memory care. Pt presents this date with decreased strength, standing and fxl activity tolerance and general deconditioning. On OT assessment this date, pt requires SETUP to MIN A for seated UB ADLs and requires MIN A for seated LB ADLs, MOD A for standing LB ADLs d/t more dynamic nature of task. Pt able to come to EOB sitting with CGA and demos G static sitting balance. Requires MIN A/CGA for STS with RW and demos F/P standing balance. Pt returned to EOB sitting with his son present and call light in reach. Pt requests to sit EOB for dinner and CNA notified. Will continue to follow. At this time, as pt is requiring increased need for assist with ADLs an fxl transfers, anticipate he will require STR f/u OT services to ensure safety in the natural environment.     Follow Up Recommendations  SNF    Equipment Recommendations  3 in 1 bedside commode;Tub/shower seat;Other (comment) (2ww)    Recommendations for Other Services       Precautions / Restrictions Precautions Precautions: Fall Restrictions Weight Bearing Restrictions: No      Mobility Bed Mobility Overal bed mobility: Needs Assistance Bed Mobility: Supine to Sit     Supine to sit: Min guard;HOB elevated      General bed mobility comments: pt left in EOB sitting in prep for dinner time, son present throughout and aware to call nurse or nurse tech before leaving so that pt can be assisted back to bed and alarm re-set    Transfers Overall transfer level: Needs assistance Equipment used: Rolling walker (2 wheeled) Transfers: Sit to/from Stand Sit to Stand: Min guard;Min assist         General transfer comment: increased time and effort, steadying and cues to fully extend.    Balance Overall balance assessment: History of Falls;Needs assistance Sitting-balance support: Feet supported Sitting balance-Leahy Scale: Good       Standing balance-Leahy Scale: Fair Standing balance comment: UE support on RW and CGA/close SBA for static standing, P for dynamic standing as pt cannot accept challenge.                           ADL either performed or assessed with clinical judgement   ADL Overall ADL's : Needs assistance/impaired Eating/Feeding: Set up   Grooming: Set up;Sitting   Upper Body Bathing: Minimal assistance;Sitting Upper Body Bathing Details (indicate cue type and reason): baed on clinical obs Lower Body Bathing: Moderate assistance;Sit to/from stand Lower Body Bathing Details (indicate cue type and reason): baed on clinical obs, CGA/MIN A for STS with RW, MOD A for thorough completion of actual ADL task. Upper Body Dressing : Set up;Sitting   Lower Body Dressing: Minimal assistance;Sitting/lateral leans  Functional mobility during ADLs: Min guard;Minimal assistance;Rolling walker       Vision Patient Visual Report: No change from baseline       Perception     Praxis      Pertinent Vitals/Pain Pain Assessment: No/denies pain     Hand Dominance     Extremity/Trunk Assessment Upper Extremity Assessment Upper Extremity Assessment: Generalized weakness (ROM WFL, muscles are visibly atrophied)   Lower Extremity Assessment Lower  Extremity Assessment: Generalized weakness       Communication Communication Communication: HOH   Cognition Arousal/Alertness: Awake/alert Behavior During Therapy: WFL for tasks assessed/performed Overall Cognitive Status: No family/caregiver present to determine baseline cognitive functioning                                 General Comments: follows all commands, oriented to month/year, place, and self but not all aspects of situation and some decreased insight.   General Comments       Exercises Other Exercises Other Exercises: OT engages pt in ed re: role of OT, bed level UE and core exercise, use of incentive spirometer. Pt with good understanding and carryover.   Shoulder Instructions      Home Living Family/patient expects to be discharged to:: Private residence Living Arrangements: Alone Available Help at Discharge: Family;Available PRN/intermittently (son lives near, but works. Pt's spouse in a facility, has dementia) Type of Home: House Home Access: Stairs to enter [employer] of Steps: 2-3 Entrance Stairs-Rails: None Home Layout: One level               Home Equipment: Cane - single point   Additional Comments: patient states he has  oxygen at home but does not use it currently. he also reported multiple falls at home recently (two falls this week)      Prior Functioning/Environment Level of Independence: Independent with assistive device(s)        Comments: patient intermittently uses a cane for ambulation, independent with ADLs, drives        OT Problem List: Decreased strength;Decreased activity tolerance;Impaired balance (sitting and/or standing);Decreased knowledge of use of DME or AE;Cardiopulmonary status limiting activity      OT Treatment/Interventions: Self-care/ADL training;DME and/or AE instruction;Therapeutic activities;Balance training;Therapeutic exercise;Energy conservation;Patient/family education    OT  Goals(Current goals can be found in the care plan section) Acute Rehab OT Goals Patient Stated Goal: to feel stonger and get better OT Goal Formulation: With patient Time For Goal Achievement: 08/12/21 Potential to Achieve Goals: Good ADL Goals Pt Will Perform Grooming: with supervision;standing (sink-side with LRAD to complete 2-3 g/h tasks to increase fxl standing tolerance for ADLs) Pt Will Perform Lower Body Dressing: with min guard assist;sit to/from stand (with LRAD/AE for EC PRN) Pt Will Transfer to Toilet: with supervision;with min guard assist;ambulating;bedside commode (to/from restroom with BSC over toilet to elevate with minimal cues for pacing/self-initiating rest break) Pt Will Perform Toileting - Clothing Manipulation and hygiene: with supervision;sit to/from stand  OT Frequency: Min 1X/week   Barriers to D/C:            Co-evaluation              AM-PAC OT "6 Clicks" Daily Activity     Outcome Measure Help from another person eating meals?: None Help from another person taking care of personal grooming?: None Help from another person toileting, which includes using toliet, bedpan, or urinal?: A Lot Help from another  person bathing (including washing, rinsing, drying)?: A Lot Help from another person to put on and taking off regular upper body clothing?: A Little Help from another person to put on and taking off regular lower body clothing?: A Little 6 Click Score: 18   End of Session Equipment Utilized During Treatment: Gait belt;Rolling walker;Oxygen Nurse Communication: Mobility status;Other (comment) (EOB sitting for dinner)  Activity Tolerance: Patient tolerated treatment well Patient left: with family/visitor present;Other (comment);with call bell/phone within reach (seated EOB with tray table in front, CNA notified and son aware to call nurse or CNA prior to leaving)  OT Visit Diagnosis: Unsteadiness on feet (R26.81);Muscle weakness (generalized) (M62.81)                 Time: [id number] OT Time Calculation (min): 36 min Charges:  OT General Charges $OT Visit: 1 Visit OT Evaluation $OT Eval Low Complexity: 1 Low OT Treatments $Self Care/Home Management : 8-22 mins  Gerrianne Scale, MS, OTR/L ascom [phone] 07/29/21, 6:36 PM

## 2021-07-29 NOTE — TOC Initial Note (Signed)
Transition of Care [facility]) - Initial/Assessment Note    Patient Details  Name: Jacob [name] MRN: [id number] Date of Birth: Dec 12, 1934  [facility] CM/SW Contact:    Candie Chroman, LCSW Phone Number: 07/29/2021, 3:19 PM  Clinical Narrative:  CSW met with patient. No supports at bedside. CSW introduced role and explained that PT recommendations would be discussed. Patient prefers to return home but will think about SNF. His wife is currently at [facility] under LTC. Had to explain that they would bill his Medicare a couple of times. Patient says he feels safe returning home but needs someone to come in and clean his home. No further concerns. CSW will follow up tomorrow.                Expected Discharge Plan: [place] Barriers to Discharge: Continued Medical Work up   Patient Goals and CMS Choice        Expected Discharge Plan and Services Expected Discharge Plan: [place] Acute Care Choice:  (TBD) Living arrangements for the past 2 months: Single Family Home                                      Prior Living Arrangements/Services Living arrangements for the past 2 months: Single Family Home Lives with:: Self Patient language and need for interpreter reviewed:: Yes Do you feel safe going back to the place where you live?: Yes      Need for Family Participation in Patient Care: Yes (Comment) Care giver support system in place?: No (comment)   Criminal Activity/Legal Involvement Pertinent to Current Situation/Hospitalization: No - Comment as needed  Activities of Daily Living Home Assistive Devices/Equipment: Cane (specify quad or straight) (straight) ADL Screening (condition at time of admission) Patient's cognitive ability adequate to safely complete daily activities?: Yes Is the patient deaf or have difficulty hearing?: No Does the patient have difficulty seeing, even when wearing  glasses/contacts?: No Does the patient have difficulty concentrating, remembering, or making decisions?: No Patient able to express need for assistance with ADLs?: Yes Does the patient have difficulty dressing or bathing?: No Independently performs ADLs?: Yes (appropriate for developmental age) Does the patient have difficulty walking or climbing stairs?: Yes Weakness of Legs: Both Weakness of Arms/Hands: Both  Permission Sought/Granted Permission sought to share information with : Facility Art therapist granted to share information with : Yes, Verbal Permission Granted              Emotional Assessment Appearance:: Appears stated age Attitude/Demeanor/Rapport: Engaged, Gracious Affect (typically observed): Calm, Pleasant Orientation: : Oriented to Self, Oriented to Place, Oriented to  Time, Oriented to Situation Alcohol / Substance Use: Not Applicable Psych Involvement: No (comment)  Admission diagnosis:  CAP (community acquired pneumonia) [J18.9] Acute respiratory failure with hypoxia ([place]) [J96.01] Community acquired pneumonia, unspecified laterality [J18.9] Patient Active Problem List   Diagnosis Date Noted   Acute respiratory failure with hypoxia ([place])    Lobar pneumonia ([place])    COPD with acute exacerbation ([place])    Weakness    CAP (community acquired pneumonia) 07/28/2021   Parkinson disease ([place]) 07/28/2021   Depression 07/28/2021   Genetic testing 04/12/2021   Family history of prostate cancer    Family history of breast cancer    Goals of care, counseling/discussion 01/18/2021   Prostate  cancer metastatic to bone ([place]) 01/14/2021   Vitiligo 10/04/2019   Vitamin D deficiency 12/28/2018   Right inguinal hernia 06/21/2018   Chronic kidney insufficiency, stage 2 (mild) 02/02/2018   Chronic bronchitis, simple ([place]) 12/28/2016   High risk medication use 12/24/2015   Cigarette smoker 12/24/2015   Benign essential hypertension 06/24/2014    Benign prostatic hyperplasia 06/24/2014   Back pain 06/24/2014   B12 deficiency 06/24/2014   PCP:  [name], MD (Inactive) Pharmacy:   [id number] [place] [id number]: [phone]: [phone]    Social Determinants of Health (SDOH) Interventions    Readmission Risk Interventions No flowsheet data found.

## 2021-07-29 NOTE — Care Management Important Message (Signed)
Important Message  Patient Details  Name: Jacob [name] MRN: [id number] Date of Birth: 26-Apr-1934   Medicare Important Message Given:  N/A - LOS <3 / Initial given by admissions     Dannette Barbara 07/29/2021, 2:20 PM

## 2021-07-29 NOTE — ED Notes (Signed)
Pt is asleep at this time on the monitor.

## 2021-07-30 ENCOUNTER — Inpatient Hospital Stay
Admit: 2021-07-30 | Discharge: 2021-07-30 | Disposition: A | Discharge status: Home / Self Care | Payer: Medicare Other | Attending: Internal Medicine | Admitting: Internal Medicine

## 2021-07-30 DIAGNOSIS — J189 Pneumonia, unspecified organism: Principal | ICD-10-CM

## 2021-07-30 DIAGNOSIS — J181 Lobar pneumonia, unspecified organism: Secondary | ICD-10-CM | POA: Diagnosis not present

## 2021-07-30 DIAGNOSIS — W57XXXA Bitten or stung by nonvenomous insect and other nonvenomous arthropods, initial encounter: Secondary | ICD-10-CM

## 2021-07-30 DIAGNOSIS — E43 Unspecified severe protein-calorie malnutrition: Secondary | ICD-10-CM

## 2021-07-30 DIAGNOSIS — J9601 Acute respiratory failure with hypoxia: Secondary | ICD-10-CM | POA: Diagnosis not present

## 2021-07-30 DIAGNOSIS — I10 Essential (primary) hypertension: Secondary | ICD-10-CM | POA: Diagnosis not present

## 2021-07-30 DIAGNOSIS — Z515 Encounter for palliative care: Secondary | ICD-10-CM

## 2021-07-30 DIAGNOSIS — C61 Malignant neoplasm of prostate: Secondary | ICD-10-CM | POA: Diagnosis not present

## 2021-07-30 DIAGNOSIS — W57XXXS Bitten or stung by nonvenomous insect and other nonvenomous arthropods, sequela: Secondary | ICD-10-CM

## 2021-07-30 DIAGNOSIS — F17210 Nicotine dependence, cigarettes, uncomplicated: Secondary | ICD-10-CM

## 2021-07-30 DIAGNOSIS — C7951 Secondary malignant neoplasm of bone: Secondary | ICD-10-CM

## 2021-07-30 DIAGNOSIS — J441 Chronic obstructive pulmonary disease with (acute) exacerbation: Secondary | ICD-10-CM | POA: Diagnosis not present

## 2021-07-30 LAB — ECHOCARDIOGRAM COMPLETE
AR max vel: 1.24 cm2
AV Area VTI: 1.41 cm2
AV Area mean vel: 1.32 cm2
AV Mean grad: 4 mmHg
AV Peak grad: 7 mmHg
Ao pk vel: 1.32 m/s
Area-P 1/2: 2.91 cm2
Height: 72 in
S' Lateral: 3.02 cm
Weight: 2045.87 oz

## 2021-07-30 NOTE — Clinical Social Work Note (Addendum)
RE: Jacob [name] Date of Birth: 1934-06-27 Date: 07/30/2021   To Whom It May Concern:  Please be advised that the above-named patient will require a short-term nursing home stay - anticipated 30 days or less for rehabilitation and strengthening.  The plan is for return home.

## 2021-07-30 NOTE — NC FL2 (Signed)
[place] LEVEL OF CARE SCREENING TOOL     IDENTIFICATION  Patient Name: Jacob [name] Birthdate: January 20, 1934 Sex: male Admission Date (Current Location): 07/28/2021  [facility] and [place] Number:  Engineering geologist and Address:  [facility], [address], [place], [place] [address]      Provider Number: [id number]  Attending Physician Name and Address:  Loletha Grayer, MD  Relative Name and Phone Number:       Current Level of Care: Hospital Recommended Level of Care: [place] Prior Approval Number:    Date Approved/Denied:   PASRR Number: Manual review  Discharge Plan: SNF    Current Diagnoses: Patient Active Problem List   Diagnosis Date Noted   Protein-calorie malnutrition, severe 07/29/2021   Acute respiratory failure with hypoxia (HCC)    Lobar pneumonia ([place])    COPD with acute exacerbation ([place])    Weakness    CAP (community acquired pneumonia) 07/28/2021   Parkinson disease ([place]) 07/28/2021   Depression 07/28/2021   Genetic testing 04/12/2021   Family history of prostate cancer    Family history of breast cancer    Goals of care, counseling/discussion 01/18/2021   Prostate cancer metastatic to bone ([place]) 01/14/2021   Vitiligo 10/04/2019   Vitamin D deficiency 12/28/2018   Right inguinal hernia 06/21/2018   Chronic kidney insufficiency, stage 2 (mild) 02/02/2018   Chronic bronchitis, simple ([place]) 12/28/2016   High risk medication use 12/24/2015   Cigarette smoker 12/24/2015   Benign essential hypertension 06/24/2014   Benign prostatic hyperplasia 06/24/2014   Back pain 06/24/2014   B12 deficiency 06/24/2014    Orientation RESPIRATION BLADDER Height & Weight     Self, Time, Situation, Place  Normal Continent Weight: 127 lb 13.9 oz (58 kg) Height:  6' (182.9 cm)  BEHAVIORAL SYMPTOMS/MOOD NEUROLOGICAL BOWEL NUTRITION STATUS   (None)  (Parkinson's) Continent Diet (Soft diet)  AMBULATORY  STATUS COMMUNICATION OF NEEDS Skin   Limited Assist Verbally Normal                       Personal Care Assistance Level of Assistance  Bathing, Feeding, Dressing Bathing Assistance: Limited assistance Feeding assistance: Limited assistance Dressing Assistance: Limited assistance     Functional Limitations Info  Sight, Hearing, Speech Sight Info: Adequate Hearing Info: Adequate Speech Info: Adequate    SPECIAL CARE FACTORS FREQUENCY  PT (By licensed PT), OT (By licensed OT)     PT Frequency: 5 x week OT Frequency: 5 x week            Contractures Contractures Info: Not present    Additional Factors Info  Code Status, Allergies, Isolation Precautions, Psychotropic Code Status Info: Full code Allergies Info: NKDA Psychotropic Info: Depression   Isolation Precautions Info: Contact     Current Medications (07/30/2021):  This is the current hospital active medication list Current Facility-Administered Medications  Medication Dose Route Frequency Provider Last Rate Last Admin   acetaminophen (TYLENOL) tablet 500 mg  500 mg Oral Q4H PRN Agbata, Tochukwu, MD       amitriptyline (ELAVIL) tablet 10 mg  10 mg Oral QHS Agbata, Tochukwu, MD   10 mg at 07/29/21 2106   amLODipine (NORVASC) tablet 5 mg  5 mg Oral Daily Agbata, Tochukwu, MD   5 mg at 07/30/21 0834   aspirin EC tablet 81 mg  81 mg Oral Daily Agbata, Tochukwu, MD   81 mg at 07/30/21 0834   azithromycin (  ZITHROMAX) 500 mg in sodium chloride 0.9 % 250 mL IVPB  500 mg Intravenous Q24H Agbata, Tochukwu, MD 250 mL/hr at 07/29/21 1846 500 mg at 07/29/21 1846   carbidopa-levodopa (SINEMET IR) 25-100 MG per tablet immediate release 1 tablet  1 tablet Oral TID Agbata, Tochukwu, MD   1 tablet at 07/30/21 0834   cefTRIAXone (ROCEPHIN) 2 g in sodium chloride 0.9 % 100 mL IVPB  2 g Intravenous Q24H Agbata, Tochukwu, MD 200 mL/hr at 07/29/21 1729 2 g at 07/29/21 1729   enoxaparin (LOVENOX) injection 40 mg  40 mg Subcutaneous Q24H  Agbata, Tochukwu, MD   40 mg at 07/29/21 2104   feeding supplement (ENSURE ENLIVE / ENSURE PLUS) liquid 237 mL  237 mL Oral TID BM Loletha Grayer, MD   237 mL at 07/30/21 0839   finasteride (PROSCAR) tablet 5 mg  5 mg Oral Daily Agbata, Tochukwu, MD   5 mg at 07/30/21 0833   gabapentin (NEURONTIN) capsule 100 mg  100 mg Oral BID Agbata, Tochukwu, MD   100 mg at 07/30/21 0835   ipratropium-albuterol (DUONEB) 0.5-2.5 (3) MG/3ML nebulizer solution 3 mL  3 mL Nebulization Q6H PRN Agbata, Tochukwu, MD       MEDLINE mouth rinse  15 mL Mouth Rinse BID Agbata, Tochukwu, MD   15 mL at 07/30/21 0839   metoprolol succinate (TOPROL-XL) 24 hr tablet 50 mg  50 mg Oral Daily Agbata, Tochukwu, MD   50 mg at 07/30/21 0835   mirtazapine (REMERON) tablet 7.5 mg  7.5 mg Oral QHS Agbata, Tochukwu, MD   7.5 mg at 07/29/21 2105   multivitamin with minerals tablet 1 tablet  1 tablet Oral Daily Loletha Grayer, MD   1 tablet at 07/30/21 0834   polyethylene glycol (MIRALAX / GLYCOLAX) packet 17 g  17 g Oral Daily Loletha Grayer, MD   17 g at 07/30/21 0836   predniSONE (DELTASONE) tablet 40 mg  40 mg Oral Q breakfast Agbata, Tochukwu, MD   40 mg at 07/30/21 [id number]   tamsulosin (FLOMAX) capsule 0.4 mg  0.4 mg Oral Daily Agbata, Tochukwu, MD   0.4 mg at 07/30/21 [id number]   vitamin B-12 (CYANOCOBALAMIN) tablet 500 mcg  500 mcg Oral QHS Agbata, Tochukwu, MD   500 mcg at 07/29/21 2105     Discharge Medications: Please see discharge summary for a list of discharge medications.  Relevant Imaging Results:  Relevant Lab Results:   Additional Information SS#: [id number]. Has two vaccines: Pfizer 02/03/20, 02/24/20. No boosters. Has bed bugs at home. Knows not to bring anything from home.  Candie Chroman, LCSW

## 2021-07-30 NOTE — Progress Notes (Signed)
*  PRELIMINARY RESULTS* Echocardiogram 2D Echocardiogram has been performed.  Sherrie Sport 07/30/2021, 11:22 AM

## 2021-07-30 NOTE — TOC Progression Note (Addendum)
Transition of Care [facility]) - Progression Note    Patient Details  Name: Jacob [name] MRN: [id number] Date of Birth: [birth year]  Transition of Care [facility] CM/SW [place], LCSW Phone Number: 07/30/2021, 12:20 PM  Clinical Narrative:  Patient is agreeable to SNF placement. He has had two COVID vaccines, no boosters. He understands he cannot bring belongings from home due to bed bugs. PASARR under manual review. Uploaded requested documents into [facility] [employer].  12:38 pm: PASARR obtained: [id number].  Expected Discharge Plan: [place] Barriers to Discharge: Continued Medical Work up  Expected Discharge Plan and Services Expected Discharge Plan: [place] Choice:  (TBD) Living arrangements for the past 2 months: Single Family Home                                       Social Determinants of Health (SDOH) Interventions    Readmission Risk Interventions No flowsheet data found.

## 2021-07-30 NOTE — Plan of Care (Signed)
Continuing with plan of care. 

## 2021-07-30 NOTE — Consult Note (Signed)
[place]  [phone] [phone]   Name: CORBEN AUZENNE Date: 07/30/2021 MRN: [id number]  DOB: August 22, 1934  Patient Care Team: [name], MD (Inactive) as PCP - General (Internal Medicine) Clent Jacks, RN as Oncology Nurse Navigator    REASON FOR CONSULTATION: Jacob [name] is a 87 y.o. male with multiple medical problems including prostate cancer metastatic to bone on treatment with Lupron who was admitted to the hospital 07/28/2021 with community-acquired pneumonia and COPD exacerbation.  CTA on 07/28/2021 revealed progressive sclerotic osseous metastatic disease.  Patient has limited home support and has had difficulty caring for himself.  Palliative care was consulted help address goals.  SOCIAL HISTORY:     reports that he has been smoking cigarettes. He has a 11.25 pack-year smoking history. He has never used smokeless tobacco. He reports current alcohol use. He reports that he does not use drugs.  Patient lives at home alone.  His wife is a resident at [facility] and is on chronic dialysis.  Patient has 4 sons but says that they have limited involvement.  He had a daughter but she is now deceased.  ADVANCE DIRECTIVES:  Does not have  CODE STATUS: Full code  PAST MEDICAL HISTORY: Past Medical History:  Diagnosis Date   BPH (benign prostatic hyperplasia)    Cancer ([place])    Chronic constipation 06/25/2015   COPD (chronic obstructive pulmonary disease) ([place])    pt denies.   Family history of breast cancer    Family history of prostate cancer    Hypertension    Swelling of lower extremity    bilateral   Wears dentures    full upper and lower - do not fit well.    PAST SURGICAL HISTORY:  Past Surgical History:  Procedure Laterality Date   CATARACT EXTRACTION W/PHACO Right 09/30/2015   Procedure: CATARACT EXTRACTION PHACO AND INTRAOCULAR LENS PLACEMENT ([place]);  Surgeon: Leandrew Koyanagi, MD;  Location: [facility];  Service: Ophthalmology;  Laterality: Right;   COLONOSCOPY  2009?   COLONOSCOPY WITH PROPOFOL N/A 01/06/2021   Procedure: COLONOSCOPY WITH PROPOFOL;  Surgeon: Lin Landsman, MD;  Location: [facility];  Service: Gastroenterology;  Laterality: N/A;   HERNIA REPAIR Right    x2     HEMATOLOGY/ONCOLOGY HISTORY:  Oncology History  Prostate cancer metastatic to bone ([place])  01/14/2021 Initial Diagnosis   Prostate cancer ([place])   04/11/2021 Cancer Staging   Staging form: Prostate, AJCC 8th Edition - Clinical stage from 04/11/2021: Stage IVB (cT4, cNX, cM1, PSA: 549) - Signed by Sindy Guadeloupe, MD on 04/17/2021 Prostate specific antigen (PSA) range: 20 or greater     ALLERGIES:  has No Known Allergies.  MEDICATIONS:  Current Facility-Administered Medications  Medication Dose Route Frequency Provider Last Rate Last Admin   acetaminophen (TYLENOL) tablet 500 mg  500 mg Oral Q4H PRN Agbata, Tochukwu, MD       amitriptyline (ELAVIL) tablet 10 mg  10 mg Oral QHS Agbata, Tochukwu, MD   10 mg at 07/29/21 2106   amLODipine (NORVASC) tablet 5 mg  5 mg Oral Daily Agbata, Tochukwu, MD   5 mg at 07/30/21 0834   aspirin EC tablet 81 mg  81 mg Oral Daily Agbata, Tochukwu, MD   81 mg at 07/30/21 0834   azithromycin (ZITHROMAX) 500 mg in sodium chloride 0.9 % 250 mL IVPB  500 mg Intravenous Q24H Agbata, Tochukwu, MD 250 mL/hr at 07/29/21 1846  500 mg at 07/29/21 1846   carbidopa-levodopa (SINEMET IR) 25-100 MG per tablet immediate release 1 tablet  1 tablet Oral TID Collier Bullock, MD   1 tablet at 07/30/21 0834   cefTRIAXone (ROCEPHIN) 2 g in sodium chloride 0.9 % 100 mL IVPB  2 g Intravenous Q24H Agbata, Tochukwu, MD 200 mL/hr at 07/29/21 1729 2 g at 07/29/21 1729   enoxaparin (LOVENOX) injection 40 mg  40 mg Subcutaneous Q24H Agbata, Tochukwu, MD   40 mg at 07/29/21 2104   feeding supplement (ENSURE ENLIVE / ENSURE PLUS) liquid 237 mL  237 mL Oral TID BM  Loletha Grayer, MD   237 mL at 07/30/21 0839   finasteride (PROSCAR) tablet 5 mg  5 mg Oral Daily Agbata, Tochukwu, MD   5 mg at 07/30/21 0833   gabapentin (NEURONTIN) capsule 100 mg  100 mg Oral BID Agbata, Tochukwu, MD   100 mg at 07/30/21 0835   ipratropium-albuterol (DUONEB) 0.5-2.5 (3) MG/3ML nebulizer solution 3 mL  3 mL Nebulization Q6H PRN Agbata, Tochukwu, MD       MEDLINE mouth rinse  15 mL Mouth Rinse BID Agbata, Tochukwu, MD   15 mL at 07/30/21 0839   metoprolol succinate (TOPROL-XL) 24 hr tablet 50 mg  50 mg Oral Daily Agbata, Tochukwu, MD   50 mg at 07/30/21 0835   mirtazapine (REMERON) tablet 7.5 mg  7.5 mg Oral QHS Agbata, Tochukwu, MD   7.5 mg at 07/29/21 2105   multivitamin with minerals tablet 1 tablet  1 tablet Oral Daily Loletha Grayer, MD   1 tablet at 07/30/21 0834   polyethylene glycol (MIRALAX / GLYCOLAX) packet 17 g  17 g Oral Daily Loletha Grayer, MD   17 g at 07/30/21 0836   predniSONE (DELTASONE) tablet 40 mg  40 mg Oral Q breakfast Agbata, Tochukwu, MD   40 mg at 07/30/21 0833   tamsulosin (FLOMAX) capsule 0.4 mg  0.4 mg Oral Daily Agbata, Tochukwu, MD   0.4 mg at 07/30/21 0835   vitamin B-12 (CYANOCOBALAMIN) tablet 500 mcg  500 mcg Oral QHS Agbata, Tochukwu, MD   500 mcg at 07/29/21 2105    VITAL SIGNS: BP 124/69 (BP Location: Left Arm)   Pulse 61   Temp 97.7 F (36.5 C) (Oral)   Resp 20   Ht 6' (1.829 m)   Wt 127 lb 13.9 oz (58 kg)   SpO2 97%   BMI 17.34 kg/m  Filed Weights   07/28/21 1029 07/30/21 0500  Weight: 135 lb (61.2 kg) 127 lb 13.9 oz (58 kg)    Estimated body mass index is 17.34 kg/m as calculated from the following:   Height as of this encounter: 6' (1.829 m).   Weight as of this encounter: 127 lb 13.9 oz (58 kg).  LABS: CBC:    Component Value Date/Time   WBC 16.1 (H) 07/29/2021 0614   HGB 15.3 07/29/2021 0614   HGB 15.9 11/17/2012 1052   HCT 46.2 07/29/2021 0614   HCT 48.8 11/17/2012 1052   PLT 344 07/29/2021 0614   PLT  305 11/17/2012 1052   MCV 89.2 07/29/2021 0614   MCV 89 11/17/2012 1052   NEUTROABS 2.3 07/05/2021 1016   LYMPHSABS 1.0 07/05/2021 1016   MONOABS 0.5 07/05/2021 1016   EOSABS 0.2 07/05/2021 1016   BASOSABS 0.0 07/05/2021 1016   Comprehensive Metabolic Panel:    Component Value Date/Time   NA 141 07/29/2021 0614   NA 138 11/17/2012 1052   K 4.1  07/29/2021 0614   K 3.7 11/18/2012 0451   CL 97 (L) 07/29/2021 0614   CL 103 11/17/2012 1052   CO2 33 (H) 07/29/2021 0614   CO2 27 11/17/2012 1052   BUN 26 (H) 07/29/2021 0614   BUN 11 11/17/2012 1052   CREATININE 1.20 07/29/2021 0614   CREATININE 1.05 11/17/2012 1052   GLUCOSE 128 (H) 07/29/2021 0614   GLUCOSE 132 (H) 11/17/2012 1052   CALCIUM 9.3 07/29/2021 0614   CALCIUM 9.1 11/17/2012 1052   AST 26 07/05/2021 1016   AST 25 11/17/2012 1052   ALT 14 07/05/2021 1016   ALT 17 11/17/2012 1052   ALKPHOS 64 07/05/2021 1016   ALKPHOS 79 11/17/2012 1052   BILITOT 0.7 07/05/2021 1016   BILITOT 0.8 11/17/2012 1052   PROT 7.3 07/05/2021 1016   PROT 7.5 11/17/2012 1052   ALBUMIN 3.7 07/05/2021 1016   ALBUMIN 3.6 11/17/2012 1052    RADIOGRAPHIC STUDIES: DG Chest 2 View  Result Date: 07/28/2021 CLINICAL DATA:  Cough shortness of breath. Prostate cancer history in this 87 year old male. EXAM: CHEST - 2 VIEW COMPARISON:  Examination of October 08, 2020. FINDINGS: Trachea midline. Cardiomediastinal contours and hilar structures are stable. No lobar consolidation or sign of pleural effusion. Lungs are hyperinflated mildly. Tortuous thoracic aorta with similar appearance. On limited assessment no acute skeletal process. No visible pneumothorax. Signs of skin folds projecting over the RIGHT mid chest. IMPRESSION: No active cardiopulmonary disease. Electronically Signed   By: Zetta Bills M.D.   On: 07/28/2021 12:36   CT Angio Chest PE W and/or Wo Contrast  Result Date: 07/28/2021 CLINICAL DATA:  Pulmonary emboli suspected, high probability.  Shortness of breath. Prostate cancer. EXAM: CT ANGIOGRAPHY CHEST WITH CONTRAST TECHNIQUE: Multidetector CT imaging of the chest was performed using the standard protocol during bolus administration of intravenous contrast. Multiplanar CT image reconstructions and MIPs were obtained to evaluate the vascular anatomy. CONTRAST:  28m OMNIPAQUE IOHEXOL 350 MG/ML SOLN COMPARISON:  Chest radiography same day. FINDINGS: Cardiovascular: Heart size is normal. No pericardial fluid. No visible coronary artery calcification. Mild aortic atherosclerotic calcification and tortuosity. Pulmonary arterial opacification is good. There are no pulmonary emboli. Mediastinum/Nodes: No mediastinal or hilar mass or lymphadenopathy. Lungs/Pleura: No pleural effusion. Underlying emphysema and pulmonary scarring. There are scattered areas of hazy opacity in both lungs which were not present in February of this year, suggesting early pneumonia. No dense consolidation or lobar collapse. Upper Abdomen: Multiple cysts of the kidneys as seen previously. 1 cm liver cyst at the dome. Musculoskeletal: Scoliosis and degenerative change of the spine. Multiple sclerotic foci throughout the regional skeleton consistent with known osseous metastatic disease. This is progressive since February. Review of the MIP images confirms the above findings. IMPRESSION: No pulmonary emboli. Aortic Atherosclerosis (ICD10-I70.0) and Emphysema (ICD10-J43.9). Increased hazy pulmonary opacities scattered within both lungs, not present in February, favored to represent mild/early pneumonia. No dense consolidation or lobar collapse. Progression of sclerotic osseous metastatic disease without evidence of fracture or lytic destructive lesion. Electronically Signed   By: MNelson ChimesM.D.   On: 07/28/2021 14:47    PERFORMANCE STATUS (ECOG) : 3 - Symptomatic, >50% confined to bed  Review of Systems Unless otherwise noted, a complete review of systems is  negative.  Physical Exam General: NAD Pulmonary: Unlabored Extremities: no edema, no joint deformities Skin: no rashes Neurological: Weakness but otherwise nonfocal  IMPRESSION: I met with patient.  I introduced palliative care services and attempted to establish therapeutic rapport.  Patient recognizes that he has been treated for pneumonia.  We discussed the results of his CTA demonstrating progressive osseous metastatic disease.  This news seemed surprising to him.  Apparently, patient's cancer treatment options have been somewhat limited due to poor social support.  However, patient remains committed to continuing the current scope of treatment.  Patient readily admits that he has been doing poorly at home.  He lives at home alone but has a son, [name], who is most involved in his care.  However, patient says that his sons are not as involved as he would like.  Patient says that he is mostly sedentary and goes multiple weeks without showering.  Additionally, his house is reportedly infested with bedbugs.  We have previously tried referring patient to community palliative care social worker but patient has not reliably answered the door when they have arrived.  Today, patient says that he would agree to going to SNF for rehab with hope that he could get stronger and return home.  We talked at length about the expectation that he will probably decline over time and it is possible that his current performance status reflects his new baseline.  Given his poor social support, SNF placement would be reasonable.  Patient says he would like to see how it goes but would consider SNF placement if needed in the future.  Note that his wife is currently at [facility] but he would want a different facility.  We discussed CODE STATUS including probable futility associated with resuscitative efforts.  Patient says that he will think about decision making.  I tried calling his son but was unable to reach him.   Voicemail was left.  PLAN: -Continue current prescription treatment -Voicemail left for patient's son -Agree with SNF  Case discussed with Dr. Janese Banks  Time Total: 60 minutes  Visit consisted of counseling and education dealing with the complex and emotionally intense issues of symptom management and palliative care in the setting of serious and potentially life-threatening illness.Greater than 50%  of this time was spent counseling and coordinating care related to the above assessment and plan.  Signed by: Altha Harm, PhD, NP-C

## 2021-07-30 NOTE — Progress Notes (Signed)
Mobility Specialist - Progress Note   07/30/21 1500  Mobility  Activity Transferred:  Bed to chair;Transferred:  Chair to bed  Range of Motion/Exercises Active  Level of Assistance Standby assist, set-up cues, supervision of patient - no hands on  Assistive Device Front wheel walker  Distance Ambulated (ft) 2 ft  Mobility Sit up in bed/chair position for meals;Ambulated with assistance in room  Mobility Response Tolerated well  Mobility performed by Mobility specialist  $Mobility charge 1 Mobility    Pre-mobility: 69 HR, 98% SpO2   Pt lying in bed upon arrival. Pt upset that he has yet to receive lunch, mobility assisted pt with contacting dietary services to place order. Pt requesting a bath. Pt sat EOB with supervision, and SPT to chair for seated bathing tasks. Set-up assist only. Pt able to don briefs with minA--modI for gown change. Pt transferred back to bed, lunch entered, and was placed in front of pt. Pt feeling better after session and lunch arrival. Pt left EOB with alarm set.    Kathee Delton Mobility Specialist 07/30/21, 3:29 PM

## 2021-07-30 NOTE — Progress Notes (Signed)
Occupational Therapy Treatment Patient Details Name: Jacob [name] MRN: [id number] DOB: 1934-07-13 Today's Date: 07/30/2021    History of present illness Patient is an 87 year old male with a history of emphysema and prostate cancer who presents to the ER from urgent care for evaluation of worsening shortness of breath, nonproductive cough and wheezing.  Imaging suggestive of possible early pneumonia   OT comments  Pt seen for OT treatment this date to f/u re: safety with ADLs/ADL mobility. Pt requesting to shave and RN okays. Pt able to come to EOB sitting with SUPV with HOB elevated, using bed rails. Demos G sitting balance. OT engages pt in standing self-care sink-side x20 mins with minimal cues for pacing with RW for UE support. CGA to SUPV for fxl mobility to/from sink and for standing ADLs. Cues for safety with mobilizing. Pt returned to bed end of session. Improved fxl activity tolerance noted this date with O2 sustaining >91% on 2Lnc with activity. Attempted on RA and pt de-sats to 87-89% with standing. Nasal cannula re-applied. Generally tolerates well. Will continue to follow.    Follow Up Recommendations  SNF    Equipment Recommendations  3 in 1 bedside commode;Tub/shower seat;Other (comment) (2ww)    Recommendations for Other Services      Precautions / Restrictions Precautions Precautions: Fall Restrictions Weight Bearing Restrictions: No       Mobility Bed Mobility Overal bed mobility: Needs Assistance Bed Mobility: Supine to Sit     Supine to sit: Supervision;HOB elevated Sit to supine: Supervision        Transfers Overall transfer level: Needs assistance Equipment used: Rolling walker (2 wheeled) Transfers: Sit to/from Stand Sit to Stand: Min guard;Supervision         General transfer comment: increased time and effort, steadying and cues to fully extend.    Balance Overall balance assessment: History of Falls;Needs assistance Sitting-balance  support: Feet supported Sitting balance-Leahy Scale: Good     Standing balance support: Single extremity supported;During functional activity Standing balance-Leahy Scale: Fair                             ADL either performed or assessed with clinical judgement   ADL Overall ADL's : Needs assistance/impaired     Grooming: Supervision/safety;Min guard;Standing Grooming Details (indicate cue type and reason): sink-side to shave his face (RN okay'ed). Tolerates standing ~20 mins                                     Vision Patient Visual Report: No change from baseline     Perception     Praxis      Cognition Arousal/Alertness: Awake/alert Behavior During Therapy: WFL for tasks assessed/performed Overall Cognitive Status: Within Functional Limits for tasks assessed                                          Exercises Other Exercises Other Exercises: OT engages pt in standing self-care sink-side x20 mins with minimal cues for pacing with RW for UE support. CGA to SUPV for fxl mobility to/from sink and for standing ADLs.   Shoulder Instructions       General Comments      Pertinent Vitals/ Pain       Pain  Assessment: No/denies pain  Home Living                                          Prior Functioning/Environment              Frequency  Min 1X/week        Progress Toward Goals  OT Goals(current goals can now be found in the care plan section)  Progress towards OT goals: Progressing toward goals  Acute Rehab OT Goals Patient Stated Goal: to feel stonger and get better OT Goal Formulation: With patient Time For Goal Achievement: 08/12/21 Potential to Achieve Goals: Good  Plan Discharge plan remains appropriate    Co-evaluation                 AM-PAC OT "6 Clicks" Daily Activity     Outcome Measure   Help from another person eating meals?: None Help from another person taking care  of personal grooming?: None Help from another person toileting, which includes using toliet, bedpan, or urinal?: A Lot Help from another person bathing (including washing, rinsing, drying)?: A Lot Help from another person to put on and taking off regular upper body clothing?: A Little Help from another person to put on and taking off regular lower body clothing?: A Little 6 Click Score: 18    End of Session Equipment Utilized During Treatment: Gait belt;Rolling walker;Oxygen  OT Visit Diagnosis: Unsteadiness on feet (R26.81);Muscle weakness (generalized) (M62.81)   Activity Tolerance Patient tolerated treatment well   Patient Left with call bell/phone within reach;in bed;with bed alarm set   Nurse Communication Mobility status;Other (comment) (pt requesting to shave and RN okay'ed)        [id number] OT Time Calculation (min): 38 min  Charges: OT General Charges $OT Visit: 1 Visit OT Treatments $Self Care/Home Management : 23-37 mins $Therapeutic Activity: 8-22 mins  Gerrianne Scale, [place], OTR/L ascom [phone] 07/30/21, 5:54 PM

## 2021-07-30 NOTE — Progress Notes (Signed)
Patient ID: Jacob [name], male   DOB: 1934-03-31, 87 y.o.   MRN: [id number] [facility] PROGRESS NOTE  Jacob [name] [id number] DOB: 06/05/1934 DOA: 07/28/2021 PCP: [name], MD (Inactive)  HPI/Subjective: Patient now agreeable to go to rehab.  Still feels a little short of breath and some cough.  Still having weakness.  Admitted with acute hypoxic respiratory failure and pneumonia.  Objective: Vitals:   07/30/21 0923 07/30/21 1542  BP: 124/69 131/73  Pulse: 61 70  Resp: 20 18  Temp: 97.7 F (36.5 C) 98.3 F (36.8 C)  SpO2: 97% 94%    Intake/Output Summary (Last 24 hours) at 07/30/2021 1612 Last data filed at 07/30/2021 0433 Gross per 24 hour  Intake --  Output 600 ml  Net -600 ml   Filed Weights   07/28/21 1029 07/30/21 0500  Weight: 61.2 kg 58 kg    ROS: Review of Systems  Respiratory:  Positive for cough and shortness of breath.   Cardiovascular:  Negative for chest pain.  Gastrointestinal:  Negative for abdominal pain, nausea and vomiting.  Exam: Physical Exam HENT:     Head: Normocephalic.     Mouth/Throat:     Pharynx: No oropharyngeal exudate.  Eyes:     General: Lids are normal.     Conjunctiva/sclera: Conjunctivae normal.  Cardiovascular:     Rate and Rhythm: Normal rate and regular rhythm.     Heart sounds: Normal heart sounds, S1 normal and S2 normal.  Pulmonary:     Breath sounds: Examination of the right-lower field reveals decreased breath sounds. Examination of the left-lower field reveals decreased breath sounds. Decreased breath sounds present. No wheezing, rhonchi or rales.  Abdominal:     Palpations: Abdomen is soft.     Tenderness: There is no abdominal tenderness.  Musculoskeletal:     Right lower leg: Swelling present.     Left lower leg: Swelling present.  Skin:    General: Skin is warm.     Findings: No rash.  Neurological:     Mental Status: He is alert.     Comments: Answers questions appropriately      Scheduled  Meds:  amitriptyline  10 mg Oral QHS   amLODipine  5 mg Oral Daily   aspirin EC  81 mg Oral Daily   carbidopa-levodopa  1 tablet Oral TID   enoxaparin (LOVENOX) injection  40 mg Subcutaneous Q24H   feeding supplement  237 mL Oral TID BM   finasteride  5 mg Oral Daily   gabapentin  100 mg Oral BID   mouth rinse  15 mL Mouth Rinse BID   metoprolol succinate  50 mg Oral Daily   mirtazapine  7.5 mg Oral QHS   multivitamin with minerals  1 tablet Oral Daily   polyethylene glycol  17 g Oral Daily   predniSONE  40 mg Oral Q breakfast   tamsulosin  0.4 mg Oral Daily   vitamin B-12  500 mcg Oral QHS   Continuous Infusions:  azithromycin 500 mg (07/29/21 1846)   cefTRIAXone (ROCEPHIN)  IV 2 g (07/29/21 1729)    Assessment/Plan:  Acute hypoxic respiratory failure.  Patient had a pulse ox of 84% on room air on presentation.  Try to continue to taper off oxygen since he does not wear oxygen at home. Bilateral lobar pneumonia.  Continue Rocephin and Zithromax. COPD exacerbation.  Continue prednisone Essential hypertension on Norvasc and metoprolol Metastatic prostate cancer to bone.  Oncology and palliative care consultation.  Weakness.  Physical therapy recommends rehab BPH.  Patient on Flomax Bedbugs.  Isolation.  Spoke with son about how to kill the bedbugs his father home.      Code Status:     Code Status Orders  (From admission, onward)           Start     Ordered   07/28/21 1525  Full code  Continuous        07/28/21 1530           Code Status History     This patient has a current code status but no historical code status.      Family Communication: Spoke with son on the phone Disposition Plan: Status is: Inpatient  Dispo: The patient is from: Home              Anticipated d/c is to: Rehab              Patient currently being treated for acute hypoxic respiratory failure and pneumonia   Difficult to place patient.  No  Consultants: Oncology Palliative  care  Antibiotics: Rocephin and Zithromax  Time spent: 28 minutes  Ashwika Freels [employer]

## 2021-07-31 DIAGNOSIS — G20 Parkinson's disease: Secondary | ICD-10-CM

## 2021-07-31 DIAGNOSIS — C61 Malignant neoplasm of prostate: Secondary | ICD-10-CM | POA: Diagnosis not present

## 2021-07-31 DIAGNOSIS — J441 Chronic obstructive pulmonary disease with (acute) exacerbation: Secondary | ICD-10-CM | POA: Diagnosis not present

## 2021-07-31 DIAGNOSIS — J9601 Acute respiratory failure with hypoxia: Secondary | ICD-10-CM | POA: Diagnosis not present

## 2021-07-31 DIAGNOSIS — J181 Lobar pneumonia, unspecified organism: Secondary | ICD-10-CM | POA: Diagnosis not present

## 2021-07-31 LAB — BASIC METABOLIC PANEL
Anion gap: 9 (ref 5–15)
BUN: 27 mg/dL — ABNORMAL HIGH (ref 8–23)
CO2: 29 mmol/L (ref 22–32)
Calcium: 8.9 mg/dL (ref 8.9–10.3)
Chloride: 101 mmol/L (ref 98–111)
Creatinine, Ser: 0.96 mg/dL (ref 0.61–1.24)
GFR, Estimated: >60 mL/min (ref >60)
Glucose, Bld: 116 mg/dL — ABNORMAL HIGH (ref 70–99)
Potassium: 3.7 mmol/L (ref 3.5–5.1)
Sodium: 139 mmol/L (ref 135–145)

## 2021-07-31 LAB — CBC
HCT: 47.9 % (ref 39.0–52.0)
Hemoglobin: 15.4 g/dL (ref 13.0–17.0)
MCH: 28.4 pg (ref 26.0–34.0)
MCHC: 32.2 g/dL (ref 30.0–36.0)
MCV: 88.2 fL (ref 80.0–100.0)
Platelets: 383 10*3/uL (ref 150–400)
RBC: 5.43 MIL/uL (ref 4.22–5.81)
RDW: 13.4 % (ref 11.5–15.5)
WBC: 11.2 10*3/uL — ABNORMAL HIGH (ref 4.0–10.5)
nRBC: 0 % (ref 0.0–0.2)

## 2021-07-31 MED ORDER — SODIUM CHLORIDE 0.9 % IV SOLN
INTRAVENOUS | Status: DC | PRN
  Administered 2021-07-31: 250 mL via INTRAVENOUS

## 2021-07-31 MED ORDER — AZITHROMYCIN 250 MG PO TABS
500.0000 mg | ORAL_TABLET | Freq: Every day | ORAL | Status: AC
  Administered 2021-07-31 – 2021-08-01 (×2): 500 mg via ORAL
  Filled 2021-07-31 (×2): qty 2

## 2021-07-31 NOTE — Progress Notes (Signed)
Patient ID: Jacob [name], male   DOB: Mar 02, 1934, 87 y.o.   MRN: [id number] [facility] PROGRESS NOTE  TARUS ENTIN [id number] DOB: 1934/04/16 DOA: 07/28/2021 PCP: [name], MD (Inactive)  HPI/Subjective: Patient still has a little shortness of breath and cough.  Still feeling weak.  Agreeable to going to rehab.  Came in with pneumonia and acute hypoxic respiratory failure.  Objective: Vitals:   07/31/21 0739 07/31/21 1558  BP: (!) 139/93 (!) 120/94  Pulse: 62 68  Resp: 18 18  Temp: (!) 97.5 F (36.4 C) 98 F (36.7 C)  SpO2: 97% 98%    Intake/Output Summary (Last 24 hours) at 07/31/2021 1627 Last data filed at 07/31/2021 1246 Gross per 24 hour  Intake 1081.72 ml  Output 1301 ml  Net -219.28 ml   Filed Weights   07/28/21 1029 07/30/21 0500  Weight: 61.2 kg 58 kg    ROS: Review of Systems  Respiratory:  Positive for cough and shortness of breath.   Cardiovascular:  Negative for chest pain.  Gastrointestinal:  Negative for abdominal pain, nausea and vomiting.  Exam: Physical Exam HENT:     Head: Normocephalic.     Mouth/Throat:     Pharynx: No oropharyngeal exudate.  Eyes:     General: Lids are normal.     Conjunctiva/sclera: Conjunctivae normal.  Cardiovascular:     Rate and Rhythm: Normal rate and regular rhythm.     Heart sounds: Normal heart sounds, S1 normal and S2 normal.  Pulmonary:     Breath sounds: Examination of the right-lower field reveals decreased breath sounds. Examination of the left-lower field reveals decreased breath sounds. Decreased breath sounds present. No wheezing, rhonchi or rales.  Abdominal:     Palpations: Abdomen is soft.     Tenderness: There is no abdominal tenderness.  Musculoskeletal:     Right lower leg: Swelling present.     Left lower leg: Swelling present.  Skin:    General: Skin is warm.     Findings: No rash.  Neurological:     Mental Status: He is alert.     Comments: Answers questions appropriately       Scheduled Meds:  amitriptyline  10 mg Oral QHS   amLODipine  5 mg Oral Daily   aspirin EC  81 mg Oral Daily   azithromycin  500 mg Oral Daily   carbidopa-levodopa  1 tablet Oral TID   enoxaparin (LOVENOX) injection  40 mg Subcutaneous Q24H   feeding supplement  237 mL Oral TID BM   finasteride  5 mg Oral Daily   gabapentin  100 mg Oral BID   mouth rinse  15 mL Mouth Rinse BID   metoprolol succinate  50 mg Oral Daily   mirtazapine  7.5 mg Oral QHS   multivitamin with minerals  1 tablet Oral Daily   polyethylene glycol  17 g Oral Daily   predniSONE  40 mg Oral Q breakfast   tamsulosin  0.4 mg Oral Daily   vitamin B-12  500 mcg Oral QHS   Continuous Infusions:  sodium chloride Stopped (07/31/21 0032)   cefTRIAXone (ROCEPHIN)  IV 200 mL/hr at 07/31/21 0033    Assessment/Plan:  Acute hypoxic respiratory failure.  Patient with a pulse ox of 84% on room air on presentation.  Check a pulse ox on room air to see if we can get this patient off oxygen. Bilateral lobar pneumonia on CT scan.  On Rocephin and Zithromax COPD exacerbation continue prednisone and  nebulizers Metastatic prostate cancer to bone.  Appreciate palliative care consultation.  Patient asking questions about prognosis which I will defer back to the oncology team Essential hypertension on Norvasc and metoprolol Weakness.  Physical therapy recommending rehab.  Hopefully out to rehab on Monday or Tuesday pending bed availability BPH.  Continue Flomax Parkinson's disease on Sinemet Bedbugs at home.     Code Status:     Code Status Orders  (From admission, onward)           Start     Ordered   07/28/21 1525  Full code  Continuous        07/28/21 1530           Code Status History     This patient has a current code status but no historical code status.      Family Communication: Spoke with son on the phone yesterday Disposition Plan: Status is: Inpatient Dispo: The patient is from: Home               Anticipated d/c is to: Rehab on Monday or Tuesday pending bed availability              Patient currently improving with treatment of pneumonia.  Still trying to get off oxygen.   Difficult to place patient.  No.  Antibiotics: Rocephin and Zithromax  Time spent: 26 minutes  [place]

## 2021-07-31 NOTE — Plan of Care (Signed)
Continuing with plan of care. 

## 2021-07-31 NOTE — Progress Notes (Signed)
Mobility Specialist - Progress Note   07/31/21 1300  Mobility  Activity Ambulated in room  Level of Assistance Modified independent, requires aide device or extra time  Assistive Device Front wheel walker  Distance Ambulated (ft) 50 ft  Mobility Ambulated independently in room  Mobility Response Tolerated well  Mobility performed by Mobility specialist  $Mobility charge 1 Mobility    Pre-mobility: 65 HR, 91% SpO2 During mobility: 71 HR, 96% SpO2 Post-mobility: 63 HR, 91% SpO2   Pt semi-supine in bed upon arrival, utilizing 1L. Pt ambulated in room with RW and modified independence, no LOB. O2 maintained > 90%. Denied SOB. Encouraged sitting in recliner, pt declined. Encouraged therex, pt stated "I'll do them later today." Pt left in bed with all needs in reach.    Kathee Delton Mobility Specialist 07/31/21, 1:41 PM

## 2021-07-31 NOTE — Progress Notes (Signed)
PHARMACIST - PHYSICIAN COMMUNICATION  CONCERNING: Antibiotic IV to Oral Route Change Policy  RECOMMENDATION: This patient is receiving azithromycin by the intravenous route.  Based on criteria approved by the Pharmacy and Therapeutics Committee, the antibiotic(s) is/are being converted to the equivalent oral dose form(s).   DESCRIPTION: These criteria include: Patient being treated for a respiratory tract infection, urinary tract infection, cellulitis or clostridium difficile associated diarrhea if on metronidazole The patient is not neutropenic and does not exhibit a GI malabsorption state The patient is eating (either orally or via tube) and/or has been taking other orally administered medications for a least 24 hours The patient is improving clinically and has a Tmax < 100.5  If you have questions about this conversion, please contact the Pharmacy Department   Jacob [name]  07/31/21    

## 2021-08-01 DIAGNOSIS — J181 Lobar pneumonia, unspecified organism: Secondary | ICD-10-CM | POA: Diagnosis not present

## 2021-08-01 DIAGNOSIS — C61 Malignant neoplasm of prostate: Secondary | ICD-10-CM | POA: Diagnosis not present

## 2021-08-01 DIAGNOSIS — J441 Chronic obstructive pulmonary disease with (acute) exacerbation: Secondary | ICD-10-CM | POA: Diagnosis not present

## 2021-08-01 DIAGNOSIS — J9601 Acute respiratory failure with hypoxia: Secondary | ICD-10-CM | POA: Diagnosis not present

## 2021-08-01 MED ORDER — LACTULOSE 10 GM/15ML PO SOLN: 30.0000 g | Freq: Two times a day (BID) | ORAL | Status: DC | PRN

## 2021-08-01 MED ORDER — CEFDINIR 300 MG PO CAPS
300.0000 mg | ORAL_CAPSULE | Freq: Two times a day (BID) | ORAL | Status: DC
  Administered 2021-08-01 – 2021-08-03 (×5): 300 mg via ORAL
  Filled 2021-08-01 (×6): qty 1

## 2021-08-01 MED ORDER — LACTULOSE 10 GM/15ML PO SOLN: 30.0000 g | Freq: Two times a day (BID) | ORAL | Status: DC

## 2021-08-01 MED ORDER — POLYETHYLENE GLYCOL 3350 17 G PO PACK: 17.0000 g | PACK | Freq: Every day | ORAL | Status: DC | PRN

## 2021-08-01 NOTE — Progress Notes (Signed)
Patient ID: Jacob [name], male   DOB: 26-Sep-1934, 87 y.o.   MRN: [id number] [facility] PROGRESS NOTE  Jacob [name] [id number] DOB: [birth year] DOA: 07/28/2021 PCP: [name], MD (Inactive)  HPI/Subjective: Patient this morning complained of some constipation and a little left back pain but not over any of the bones.  Patient admitted with pneumonia and falls at home.  Objective: Vitals:   08/01/21 0438 08/01/21 1427  BP: (!) 156/98 127/89  Pulse: 68 74  Resp: 20 16  Temp: (!) 97.5 F (36.4 C) 98.2 F (36.8 C)  SpO2: 94% 96%    Intake/Output Summary (Last 24 hours) at 08/01/2021 1615 Last data filed at 08/01/2021 0458 Gross per 24 hour  Intake --  Output 1000 ml  Net -1000 ml   Filed Weights   07/28/21 1029 07/30/21 0500  Weight: 61.2 kg 58 kg    ROS: Review of Systems  Respiratory:  Positive for cough. Negative for shortness of breath.   Cardiovascular:  Negative for chest pain.  Gastrointestinal:  Positive for constipation. Negative for abdominal pain, nausea and vomiting.  Musculoskeletal:  Positive for back pain.  Exam: Physical Exam HENT:     Head: Normocephalic.     Mouth/Throat:     Pharynx: No oropharyngeal exudate.  Eyes:     General: Lids are normal.     Conjunctiva/sclera: Conjunctivae normal.  Cardiovascular:     Rate and Rhythm: Normal rate and regular rhythm.     Heart sounds: Normal heart sounds, S1 normal and S2 normal.  Pulmonary:     Breath sounds: Examination of the right-lower field reveals decreased breath sounds. Examination of the left-lower field reveals decreased breath sounds. Decreased breath sounds present. No wheezing, rhonchi or rales.  Musculoskeletal:     Right lower leg: Swelling present.     Left lower leg: Swelling present.  Skin:    General: Skin is warm.     Findings: No rash.  Neurological:     Mental Status: He is alert.      Scheduled Meds:  amitriptyline  10 mg Oral QHS   amLODipine  5 mg Oral Daily    aspirin EC  81 mg Oral Daily   carbidopa-levodopa  1 tablet Oral TID   cefdinir  300 mg Oral Q12H   enoxaparin (LOVENOX) injection  40 mg Subcutaneous Q24H   feeding supplement  237 mL Oral TID BM   finasteride  5 mg Oral Daily   gabapentin  100 mg Oral BID   mouth rinse  15 mL Mouth Rinse BID   metoprolol succinate  50 mg Oral Daily   mirtazapine  7.5 mg Oral QHS   multivitamin with minerals  1 tablet Oral Daily   predniSONE  40 mg Oral Q breakfast   tamsulosin  0.4 mg Oral Daily   vitamin B-12  500 mcg Oral QHS   Continuous Infusions:  sodium chloride Stopped (07/31/21 0032)    Assessment/Plan:  Bilateral pneumonia on CT scan.  On Omnicef and Zithromax COPD exacerbation on prednisone and nebulizer treatments. Acute hypoxic respiratory failure.  Patient had a pulse ox of 84% on room air on presentation.  Now off oxygen. Metastatic prostate cancer to bone.  Appreciate palliative care consultation.  Follow-up with cancer center and palliative care as outpatient. Essential hypertension on Norvasc and metoprolol Back pain.  Likely musculoskeletal.  Out of bed to chair. BPH on Flomax Parkinson's disease on Sinemet Bedbugs at home Weakness.  Physical therapy recommending rehab.  Severe protein calorie malnutrition Constipation resolved prior nurse giving more aggressive medications.        Code Status:     Code Status Orders  (From admission, onward)           Start     Ordered   07/28/21 1525  Full code  Continuous        07/28/21 1530           Code Status History     This patient has a current code status but no historical code status.      Family Communication: Spoke with patient's son on the phone Disposition Plan: Status is: Inpatient  Dispo: The patient is from: Home              Anticipated d/c is to: Rehab              Patient currently medically stable to go out to rehab once bed available   Difficult to place patient.   No.  Consultants: Palliative care  Antibiotics: Omnicef and Zithromax  Time spent: 27 minutes    [place]

## 2021-08-01 NOTE — Plan of Care (Signed)
Continuing with plan of care. 

## 2021-08-02 DIAGNOSIS — R531 Weakness: Secondary | ICD-10-CM | POA: Diagnosis not present

## 2021-08-02 DIAGNOSIS — J441 Chronic obstructive pulmonary disease with (acute) exacerbation: Secondary | ICD-10-CM | POA: Diagnosis not present

## 2021-08-02 DIAGNOSIS — J189 Pneumonia, unspecified organism: Secondary | ICD-10-CM | POA: Diagnosis not present

## 2021-08-02 DIAGNOSIS — J9601 Acute respiratory failure with hypoxia: Secondary | ICD-10-CM | POA: Diagnosis not present

## 2021-08-02 LAB — CULTURE, BLOOD (ROUTINE X 2)
Culture: NO GROWTH
Culture: NO GROWTH
Special Requests: ADEQUATE
Special Requests: ADEQUATE

## 2021-08-02 LAB — RESP PANEL BY RT-PCR (FLU A&B, COVID) ARPGX2
Influenza A by PCR: NEGATIVE
Influenza B by PCR: NEGATIVE
SARS Coronavirus 2 by RT PCR: NEGATIVE

## 2021-08-02 NOTE — TOC Progression Note (Signed)
Transition of Care [facility]) - Progression Note    Patient Details  Name: Jacob [name] MRN: [id number] Date of Birth: 1934-04-10  Transition of Care [facility]) CM/SW Contact  Beverly Sessions, RN Phone Number: 08/02/2021, 2:53 PM  Clinical Narrative:     medical director is requesting a peer-to-peer. Number to call is [phone] opt 5. Deadline is 10am 8/23.  Dr Leslye Peer notifed   Expected Discharge Plan: [place] Barriers to Discharge: Continued Medical Work up  Expected Discharge Plan and Services Expected Discharge Plan: [place] Choice:  (TBD) Living arrangements for the past 2 months: Single Family Home                                       Social Determinants of Health (SDOH) Interventions    Readmission Risk Interventions No flowsheet data found.

## 2021-08-02 NOTE — Progress Notes (Signed)
COVID-19 test results is negative. Discontinued airborne precautions.

## 2021-08-02 NOTE — Care Management Important Message (Signed)
Important Message  Patient Details  Name: Jacob [name] MRN: [id number] Date of Birth: 31-Mar-1934   Medicare Important Message Given:  Yes  Reviewed Medicare IM with patient via cell phone due to isolation status.  Copy of Medicare to be delivered to patient's room via nursing staff.    Dannette Barbara 08/02/2021, 1:38 PM

## 2021-08-02 NOTE — TOC Progression Note (Signed)
Transition of Care [facility]) - Progression Note    Patient Details  Name: COLLINS VANDENBOS MRN: [id number] Date of Birth: 1934-08-06  [facility]) CM/SW Contact  Beverly Sessions, RN Phone Number: 08/02/2021, 9:10 AM  Clinical Narrative:      No bed offers Search extended Reached out to [facility] and requested for them to review  Expected Discharge Plan: [place] Barriers to Discharge: Continued Medical Work up  Expected Discharge Plan and Services Expected Discharge Plan: [place] Choice:  (TBD) Living arrangements for the past 2 months: Single Family Home                                       Social Determinants of Health (SDOH) Interventions    Readmission Risk Interventions No flowsheet data found.

## 2021-08-02 NOTE — TOC Progression Note (Signed)
Transition of Care [facility]) - Progression Note    Patient Details  Name: [name] MRN: [id number] Date of Birth: [birth year]  Transition of Care [facility]) CM/SW Contact  Beverly Sessions, RN Phone Number: 08/02/2021, 4:42 PM  Clinical Narrative:    Peer to peer denied  Patient does not wish to appeal denial Patient agreeable to discharge with home heath services States he does not have a preference of home health agency  Referral made to [facility] with [place]. Per Alvis Lemmings they can accept the case however the home will need to be spray for bed bugs prior to the first visit. Patient aware and states that his landlord is to arrange pest control Patient states that he will arrange for family to transport at discharge  Referral made to [facility] with [employer] for RW   Expected Discharge Plan: [place] Barriers to Discharge: Continued Medical Work up  Expected Discharge Plan and Services Expected Discharge Plan: [place] Choice:  (TBD) Living arrangements for the past 2 months: Single Family Home                                       Social Determinants of Health (SDOH) Interventions    Readmission Risk Interventions No flowsheet data found.

## 2021-08-02 NOTE — Progress Notes (Signed)
ExtensionPatient ID: Jacob [name], male   DOB: 1934/12/02, 87 y.o.   MRN: [id number] [facility] PROGRESS NOTE  Jacob [name] [id number] DOB: Oct 19, 1934 DOA: 07/28/2021 PCP: [name], MD (Inactive)  HPI/Subjective: Patient did have a little shortness of breath last night.  Slight cough.  Otherwise feeling better than when he came in.  At this point.  Admitted with COPD exacerbation and pneumonia.  Objective: Vitals:   08/02/21 0758 08/02/21 1517  BP: (!) 142/88 (!) 119/93  Pulse: 63 78  Resp:    Temp: 97.8 F (36.6 C) 97.7 F (36.5 C)  SpO2: 95% 95%    Filed Weights   07/28/21 1029 07/30/21 0500  Weight: 61.2 kg 58 kg    ROS: Review of Systems  Respiratory:  Positive for cough and shortness of breath.   Cardiovascular:  Negative for chest pain.  Gastrointestinal:  Negative for abdominal pain.  Musculoskeletal:  Negative for joint pain and myalgias.  Exam: Physical Exam HENT:     Head: Normocephalic.     Mouth/Throat:     Pharynx: No oropharyngeal exudate.  Eyes:     General: Lids are normal.     Conjunctiva/sclera: Conjunctivae normal.  Cardiovascular:     Rate and Rhythm: Normal rate and regular rhythm.     Heart sounds: Normal heart sounds, S1 normal and S2 normal.  Pulmonary:     Breath sounds: Examination of the right-lower field reveals decreased breath sounds. Examination of the left-lower field reveals decreased breath sounds. Decreased breath sounds present. No wheezing, rhonchi or rales.  Abdominal:     Palpations: Abdomen is soft.     Tenderness: There is no abdominal tenderness.  Musculoskeletal:     Right lower leg: Swelling present.     Left lower leg: Swelling present.  Skin:    General: Skin is warm.     Findings: No rash.  Neurological:     Mental Status: He is alert.     Comments: Answers questions appropriately.      Scheduled Meds:  amitriptyline  10 mg Oral QHS   amLODipine  5 mg Oral Daily   aspirin EC  81 mg Oral Daily    carbidopa-levodopa  1 tablet Oral TID   cefdinir  300 mg Oral Q12H   enoxaparin (LOVENOX) injection  40 mg Subcutaneous Q24H   feeding supplement  237 mL Oral TID BM   finasteride  5 mg Oral Daily   gabapentin  100 mg Oral BID   mouth rinse  15 mL Mouth Rinse BID   metoprolol succinate  50 mg Oral Daily   mirtazapine  7.5 mg Oral QHS   multivitamin with minerals  1 tablet Oral Daily   tamsulosin  0.4 mg Oral Daily   vitamin B-12  500 mcg Oral QHS   Continuous Infusions:  sodium chloride Stopped (07/31/21 0032)    Assessment/Plan:  Bilateral pneumonia on CT scan.  On Omnicef and Zithromax COPD exacerbation.  On prednisone and nebulizer treatments Weakness.  Physical therapy still recommending rehab. Patient walked better today 120 feet.  Did a peer to peer with insurance company and they rejected rehab.  Patient will have to go home with home health.  Case discussed with son on the phone.  Case discussed with social worker to set up home health.  Plan on disposition tomorrow morning.  Patient lives by himself and has bedbugs at home.  Patient does not have a lot of family support since family works. Acute hypoxic  respiratory failure.  This has resolved.  Initially had a pulse ox of 84% on room air. Metastatic prostate cancer to bone.  Continue to follow-up as outpatient with Dr. Janese Banks and palliative care Back pain likely musculoskeletal this has resolved Essential hypertension on Norvasc and metoprolol BPH on Flomax Parkinson's disease on Sinemet history Bedbugs at home.  Spoke with son about how to get rid of bedbugs few days ago. Severe protein calorie malnutrition        Code Status:     Code Status Orders  (From admission, onward)           Start     Ordered   07/28/21 1525  Full code  Continuous        07/28/21 1530           Code Status History     This patient has a current code status but no historical code status.      Family Communication: Spoke with  son on the phone about insurance company rejecting rehab and the disposition will be home. Disposition Plan: Status is: Inpatient  Dispo: The patient is from: Home              Anticipated d/c is to: Home with home health likely tomorrow 08/03/2021              Patient currently medically stable for going go to rehab.  We will have to set up home health since rehab rejected.  Discussed with son how to get rid of bedbugs at home a couple days ago.   Difficult to place patient.  No.  Time spent: 32 minutes including peer to peer review.  [place]  [facility] [employer]

## 2021-08-02 NOTE — Progress Notes (Signed)
Physical Therapy Treatment Patient Details Name: Jacob [name] MRN: [id number] DOB: 11/15/1934 Today's Date: 08/02/2021    History of Present Illness Patient is an 87 year old male with a history of emphysema and prostate cancer who presents to the ER from urgent care for evaluation of worsening shortness of breath, nonproductive cough and wheezing.  Imaging suggestive of possible early pneumonia    PT Comments    Pt alert, standing in bathroom stating "he needed a bath." Pt willing to work with therapy after encouragement. Pt requires min-guard for transfers w/ RW. Progressed ambulation distance with SpO2 values > 90% on room-air, RW, min-guard for cues. Cues for RW technique are necessary to keep RW under pt's BOS due to excessive forward trunk lean. Pt denied SOB with activity. Skilled PT intervention is indicated to address deficits in function, mobility, and to return to PLOF as able.  Discharge recommendations remain SNF.   Follow Up Recommendations  SNF     Equipment Recommendations  Rolling walker with 5" wheels    Recommendations for Other Services       Precautions / Restrictions Precautions Precautions: Fall Restrictions Weight Bearing Restrictions: No    Mobility  Bed Mobility               General bed mobility comments: Pt up in restroom alone at beginning of session    Transfers Overall transfer level: Needs assistance Equipment used: Rolling walker (2 wheeled) Transfers: Sit to/from Stand Sit to Stand: Min guard         General transfer comment: Cues for RW technique  Ambulation/Gait Ambulation/Gait assistance: Min guard Gait Distance (Feet): 120 Feet Assistive device: Rolling walker (2 wheeled) Gait Pattern/deviations: Trunk flexed Gait velocity: decreased   General Gait Details: Pt requires cues for RW technique in order to maintain walker within BOS due to excessive forward trunk lean   Stairs             Wheelchair Mobility     Modified Rankin (Stroke Patients Only)       Balance Overall balance assessment: History of Falls;Needs assistance Sitting-balance support: Feet supported Sitting balance-Leahy Scale: Good     Standing balance support: During functional activity Standing balance-Leahy Scale: Fair Standing balance comment: Pt reaches for wall support without UE support during dynamic tasks, unable to accept challenges                            Cognition Arousal/Alertness: Awake/alert Behavior During Therapy: WFL for tasks assessed/performed Overall Cognitive Status: Within Functional Limits for tasks assessed                                        Exercises      General Comments        Pertinent Vitals/Pain Pain Assessment: No/denies pain    Home Living                      Prior Function            PT Goals (current goals can now be found in the care plan section) Acute Rehab PT Goals Patient Stated Goal: to feel stonger and get better PT Goal Formulation: With patient Time For Goal Achievement: 08/12/21 Potential to Achieve Goals: Good Progress towards PT goals: Progressing toward goals    Frequency  Min 2X/week      PT Plan Current plan remains appropriate    Co-evaluation              AM-PAC PT "6 Clicks" Mobility   Outcome Measure  Help needed turning from your back to your side while in a flat bed without using bedrails?: None Help needed moving from lying on your back to sitting on the side of a flat bed without using bedrails?: A Little Help needed moving to and from a bed to a chair (including a wheelchair)?: A Little Help needed standing up from a chair using your arms (e.g., wheelchair or bedside chair)?: A Little Help needed to walk in hospital room?: A Little Help needed climbing 3-5 steps with a railing? : A Little 6 Click Score: 19    End of Session Equipment Utilized During Treatment: Gait  belt Activity Tolerance: Patient tolerated treatment well Patient left: in chair;with call bell/phone within reach;with chair alarm set Nurse Communication: Mobility status PT Visit Diagnosis: Muscle weakness (generalized) (M62.81);Unsteadiness on feet (R26.81);Repeated falls (R29.6)     Time: [id number] PT Time Calculation (min) (ACUTE ONLY): 24 min  Charges:                       [employer], SPT

## 2021-08-02 NOTE — TOC Progression Note (Signed)
Transition of Care [facility]) - Progression Note    Patient Details  Name: Jacob [name] MRN: [id number] Date of Birth: 06-16-1934  Transition of [facility] CM/SW Contact  Beverly Sessions, RN Phone Number: 08/02/2021, 1:36 PM  Clinical Narrative:     Bed offer presented Patient accepted bed at [facility].  Accepted in HU, Notified Grace at [place] started [place] in [place] portal MD notified and to order covid test   Expected Discharge Plan: [place] Services Barriers to Discharge: Continued Medical Work up  Expected Discharge Plan and Services Expected Discharge Plan: [place] Acute Care Choice:  (TBD) Living arrangements for the past 2 months: Single Family Home                                       Social Determinants of Health (SDOH) Interventions    Readmission Risk Interventions No flowsheet data found.

## 2021-08-03 DIAGNOSIS — J441 Chronic obstructive pulmonary disease with (acute) exacerbation: Secondary | ICD-10-CM | POA: Diagnosis not present

## 2021-08-03 DIAGNOSIS — J181 Lobar pneumonia, unspecified organism: Secondary | ICD-10-CM | POA: Diagnosis not present

## 2021-08-03 DIAGNOSIS — J9601 Acute respiratory failure with hypoxia: Secondary | ICD-10-CM | POA: Diagnosis not present

## 2021-08-03 DIAGNOSIS — R531 Weakness: Secondary | ICD-10-CM | POA: Diagnosis not present

## 2021-08-03 LAB — PROTEIN ELECTROPHORESIS, SERUM
A/G Ratio: 0.9 (ref 0.7–1.7)
Albumin ELP: 2.8 g/dL — ABNORMAL LOW (ref 2.9–4.4)
Alpha-1-Globulin: 0.2 g/dL (ref 0.0–0.4)
Alpha-2-Globulin: 0.8 g/dL (ref 0.4–1.0)
Beta Globulin: 0.9 g/dL (ref 0.7–1.3)
Gamma Globulin: 1.2 g/dL (ref 0.4–1.8)
Globulin, Total: 3.1 g/dL (ref 2.2–3.9)
M-Spike, %: 0.2 g/dL — ABNORMAL HIGH
Total Protein ELP: 5.9 g/dL — ABNORMAL LOW (ref 6.0–8.5)

## 2021-08-03 MED ORDER — ENSURE ENLIVE PO LIQD: 237.0000 mL | Freq: Three times a day (TID) | ORAL | 0 refills | Status: AC

## 2021-08-03 MED ORDER — POLYETHYLENE GLYCOL 3350 17 G PO PACK: 17.0000 g | PACK | Freq: Every day | ORAL | 0 refills | Status: DC | PRN

## 2021-08-03 NOTE — Discharge Summary (Signed)
[place] [facility] [place] NAME: Jacob [name]    MR#:  [id number]  DATE OF BIRTH:  12-Mar-1982  DATE OF ADMISSION:  07/28/2021 ADMITTING PHYSICIAN: Collier Bullock, MD  DATE OF DISCHARGE: 08/03/2021 11:41 AM  PRIMARY CARE PHYSICIAN: [name], MD (Inactive)    ADMISSION DIAGNOSIS:  CAP (community acquired pneumonia) [J18.9] Acute respiratory failure with hypoxia ([place]) [J96.01] Community acquired pneumonia, unspecified laterality [J18.9]  DISCHARGE DIAGNOSIS:  Principal Problem:   CAP (community acquired pneumonia) Active Problems:   Essential hypertension   Prostate cancer metastatic to bone ([place])   Parkinson disease ([place])   Depression   Acute respiratory failure with hypoxia ([place])   Lobar pneumonia ([place])   COPD with acute exacerbation ([place])   Weakness   Protein-calorie malnutrition, severe   Palliative care encounter   Bedbug bite   SECONDARY DIAGNOSIS:   Past Medical History:  Diagnosis Date  . BPH (benign prostatic hyperplasia)   . Cancer ([place])   . Chronic constipation 06/25/2015  . COPD (chronic obstructive pulmonary disease) ([place])    pt denies.  . Family history of breast cancer   . Family history of prostate cancer   . Hypertension   . Swelling of lower extremity    bilateral  . Wears dentures    full upper and lower - do not fit well.    HOSPITAL COURSE:   1.  Bilateral pneumonia on CT scan.  Completed antibiotics during the hospital course with 5 days of Rocephin and Zithromax 2.  COPD exacerbation.  The patient was given Solu-Medrol initially and switched over to prednisone and completed prednisone during the hospital course.  Patient received nebulizer treatments here.  Lungs are clear upon discharge. 3.  Weakness.  Physical therapy recommended rehab but the patient walked too well for insurance company to approve rehab on 08/02/2021.  Peer-to-peer done.  We will have to go home with home health. 4.  Acute hypoxic  respiratory failure.  This has resolved.  Initially pulse ox was 84% on room air. 5.  Metastatic prostate cancer to bone.  Patient not having any bone pain at this point.  Continue outpatient follow-up with palliative care and Dr. Janese Banks. 6.  Patient did have some left back pain but this has resolved.  Likely musculoskeletal in nature from lying in the bed. 7.  Essential hypertension on Norvasc and metoprolol 8.  BPH on Flomax 9.  Parkinson's disease on Sinemet 10.  Severe protein calorie malnutrition.  Ensure. 11.  Bedbugs at home.  Spoke with son on how to get rid of the bedbugs a few days back.  DISCHARGE CONDITIONS:   Fair  CONSULTS OBTAINED:  None  DRUG ALLERGIES:  No Known Allergies  DISCHARGE MEDICATIONS:   Allergies as of 08/03/2021   No Known Allergies      Medication List     STOP taking these medications    hydrochlorothiazide 25 MG tablet Commonly known as: HYDRODIURIL       TAKE these medications    acetaminophen 500 MG tablet Commonly known as: TYLENOL Take 500 mg by mouth every 4 (four) hours as needed.   amitriptyline 10 MG tablet Commonly known as: ELAVIL 10 mg at bedtime.   amLODipine 10 MG tablet Commonly known as: NORVASC Take 5 mg by mouth daily.   aspirin 81 MG tablet Take 81 mg by mouth daily. PM   carbidopa-levodopa 25-100 MG tablet Commonly known as: SINEMET IR Take 1 tablet by mouth 3 (  three) times daily.   cetirizine 10 MG tablet Commonly known as: ZYRTEC Take 10 mg by mouth daily as needed.   feeding supplement Liqd Take 237 mLs by mouth 85 (three) times daily between meals.   finasteride 5 MG tablet Commonly known as: PROSCAR TAKE 1 TABLET BY MOUTH EVERY DAY   gabapentin 100 MG capsule Commonly known as: NEURONTIN Take 100 mg by mouth 2 (two) times daily.   metoprolol succinate 50 MG 24 hr tablet Commonly known as: TOPROL-XL Take 50 mg by mouth daily.   mirtazapine 7.5 MG tablet Commonly known as: REMERON Take 7.5  mg by mouth at bedtime.   polyethylene glycol 17 g packet Commonly known as: MIRALAX / GLYCOLAX Take 17 g by mouth daily as needed for severe constipation.   Systane Balance 0.6 % Soln Generic drug: Propylene Glycol INSTILL 3 5 TIMES A DAY IN EACH EYE AS NEEDED FOR DRYNESS.   tamsulosin 0.4 MG Caps capsule Commonly known as: FLOMAX TAKE 1 CAPSULE BY MOUTH EVERY DAY   torsemide 10 MG tablet Commonly known as: DEMADEX Take 1 tablet by mouth daily.   vitamin B-12 500 MCG tablet Commonly known as: CYANOCOBALAMIN Take 500 mcg by mouth daily. PM         DISCHARGE INSTRUCTIONS:   Follow-up PMD 85 days Follow-up Dr. Janese Banks and palliative care in a few weeks  If you experience worsening of your admission symptoms, develop shortness of breath, life threatening emergency, suicidal or homicidal thoughts you must seek medical attention immediately by calling 911 or calling your MD immediately  if symptoms less severe.  You Must read complete instructions/literature along with all the possible adverse reactions/side effects for all the Medicines you take and that have been prescribed to you. Take any new Medicines after you have completely understood and accept all the possible adverse reactions/side effects.   Please note  You were cared for by a hospitalist during your hospital stay. If you have any questions about your discharge medications or the care you received while you were in the hospital after you are discharged, you can call the unit and asked to speak with the hospitalist on call if the hospitalist that took care of you is not available. Once you are discharged, your primary care physician will handle any further medical issues. Please note that NO REFILLS for any discharge medications will be authorized once you are discharged, as it is imperative that you return to your primary care physician (or establish a relationship with a primary care physician if you do not have one) for  your aftercare needs so that they can reassess your need for medications and monitor your lab values.    Today   CHIEF COMPLAINT:   Chief Complaint  Patient presents with  . Shortness of Breath    HISTORY OF PRESENT ILLNESS:  Chevelle Dirienzo  is a 87 y.o. male came in with shortness of breath   VITAL SIGNS:  Blood pressure 128/84, pulse 78, temperature 97.8 F (36.6 C), temperature source Oral, resp. rate 16, height 6' (1.829 m), weight 58 kg, SpO2 98 %.    PHYSICAL EXAMINATION:  GENERAL:  87 y.o.-year-old patient lying in the bed with no acute distress.  EYES: Pupils equal, round, reactive to light and accommodation. No scleral icterus.  HEENT: Head atraumatic, normocephalic. Oropharynx and nasopharynx clear.   LUNGS: Decreased breath sounds bilaterally, no wheezing, rales,rhonchi or crepitation. No use of accessory muscles of respiration.  CARDIOVASCULAR: S1, S2 normal. No  murmurs, rubs, or gallops.  ABDOMEN: Soft, non-tender, non-distended. Bowel sounds present. No organomegaly or mass.  EXTREMITIES: Trace pedal edema.  NEUROLOGIC: Cranial nerves II through XII are intact. Muscle strength 5/5 in all extremities. Sensation intact. Gait not checked.  PSYCHIATRIC: The patient is alert and oriented x 3.  SKIN: No obvious rash, lesion, or ulcer.   DATA REVIEW:   CBC Recent Labs  Lab 07/31/21 0434  WBC 11.2*  HGB 15.4  HCT 47.9  PLT 383    Chemistries  Recent Labs  Lab 07/31/21 0434  NA 139  K 3.7  CL 101  CO2 29  GLUCOSE 116*  BUN 27*  CREATININE 0.96  CALCIUM 8.9     Microbiology Results  Results for orders placed or performed during the hospital encounter of 07/28/21  Blood culture (routine x 2)     Status: None   Collection Time: 07/28/21  3:34 PM   Specimen: BLOOD  Result Value Ref Range Status   Specimen Description BLOOD BLOOD LEFT ARM  Final   Special Requests   Final    BOTTLES DRAWN AEROBIC AND ANAEROBIC Blood Culture adequate volume   Culture    Final    NO GROWTH 5 DAYS Performed at [facility], [address], [place], [place] [address]    Report Status 08/02/2021 FINAL  Final  Blood culture (routine x 2)     Status: None   Collection Time: 07/28/21  3:34 PM   Specimen: BLOOD  Result Value Ref Range Status   Specimen Description BLOOD BLOOD RIGHT ARM  Final   Special Requests   Final    BOTTLES DRAWN AEROBIC AND ANAEROBIC Blood Culture adequate volume   Culture   Final    NO GROWTH 5 DAYS Performed at [facility], [address] [place], [place] [address]    Report Status 08/02/2021 FINAL  Final  Resp Panel by RT-PCR (Flu A&B, Covid) Nasopharyngeal Swab     Status: None   Collection Time: 07/28/21  3:35 PM   Specimen: Nasopharyngeal Swab; Nasopharyngeal(NP) swabs in vial transport medium  Result Value Ref Range Status   SARS Coronavirus 2 by RT PCR NEGATIVE NEGATIVE Final    Comment: (NOTE) SARS-CoV-2 target nucleic acids are NOT DETECTED.  The SARS-CoV-2 RNA is generally detectable in upper respiratory specimens during the acute phase of infection. The lowest concentration of SARS-CoV-2 viral copies this assay can detect is 138 copies/mL. A negative result does not preclude SARS-Cov-2 infection and should not be used as the sole basis for treatment or other patient management decisions. A negative result may occur with  improper specimen collection/handling, submission of specimen other than nasopharyngeal swab, presence of viral mutation(s) within the areas targeted by this assay, and inadequate number of viral copies(<138 copies/mL). A negative result must be combined with clinical observations, patient history, and epidemiological information. The expected result is Negative.  Fact Sheet for Patients:  EntrepreneurPulse.com.au  Fact Sheet for Healthcare Providers:  IncredibleEmployment.be  This test is no t yet approved or cleared by the [place] FDA and  has been authorized for detection and/or diagnosis of SARS-CoV-2 by FDA under an Emergency Use Authorization (EUA). This EUA will remain  in effect (meaning this test can be used) for the duration of the COVID-19 declaration under Section 564(b)(1) of the Act, 21 U.S.C.section 360bbb-3(b)(1), unless the authorization is terminated  or revoked sooner.       Influenza A by PCR NEGATIVE NEGATIVE Final  Influenza B by PCR NEGATIVE NEGATIVE Final    Comment: (NOTE) The Xpert Xpress SARS-CoV-2/FLU/RSV plus assay is intended as an aid in the diagnosis of influenza from Nasopharyngeal swab specimens and should not be used as a sole basis for treatment. Nasal washings and aspirates are unacceptable for Xpert Xpress SARS-CoV-2/FLU/RSV testing.  Fact Sheet for Patients: EntrepreneurPulse.com.au  Fact Sheet for Healthcare Providers: IncredibleEmployment.be  This test is not yet approved or cleared by the [place] FDA and has been authorized for detection and/or diagnosis of SARS-CoV-2 by FDA under an Emergency Use Authorization (EUA). This EUA will remain in effect (meaning this test can be used) for the duration of the COVID-19 declaration under Section 564(b)(1) of the Act, 21 U.S.C. section 360bbb-3(b)(1), unless the authorization is terminated or revoked.  Performed at [facility] [address] [place], [place] [address]   Resp Panel by RT-PCR (Flu A&B, Covid) Nasopharyngeal Swab     Status: None   Collection Time: 08/02/21  1:36 PM   Specimen: Nasopharyngeal Swab; Nasopharyngeal(NP) swabs in vial transport medium  Result Value Ref Range Status   SARS Coronavirus 2 by RT PCR NEGATIVE NEGATIVE Final    Comment: (NOTE) SARS-CoV-2 target nucleic acids are NOT DETECTED.  The SARS-CoV-2 RNA is generally detectable in upper respiratory specimens during the acute phase of infection. The lowest concentration of  SARS-CoV-2 viral copies this assay can detect is 138 copies/mL. A negative result does not preclude SARS-Cov-2 infection and should not be used as the sole basis for treatment or other patient management decisions. A negative result may occur with  improper specimen collection/handling, submission of specimen other than nasopharyngeal swab, presence of viral mutation(s) within the areas targeted by this assay, and inadequate number of viral copies(<138 copies/mL). A negative result must be combined with clinical observations, patient history, and epidemiological information. The expected result is Negative.  Fact Sheet for Patients:  EntrepreneurPulse.com.au  Fact Sheet for Healthcare Providers:  IncredibleEmployment.be  This test is no t yet approved or cleared by the [place] FDA and  has been authorized for detection and/or diagnosis of SARS-CoV-2 by FDA under an Emergency Use Authorization (EUA). This EUA will remain  in effect (meaning this test can be used) for the duration of the COVID-19 declaration under Section 564(b)(1) of the Act, 21 U.S.C.section 360bbb-3(b)(1), unless the authorization is terminated  or revoked sooner.       Influenza A by PCR NEGATIVE NEGATIVE Final   Influenza B by PCR NEGATIVE NEGATIVE Final    Comment: (NOTE) The Xpert Xpress SARS-CoV-2/FLU/RSV plus assay is intended as an aid in the diagnosis of influenza from Nasopharyngeal swab specimens and should not be used as a sole basis for treatment. Nasal washings and aspirates are unacceptable for Xpert Xpress SARS-CoV-2/FLU/RSV testing.  Fact Sheet for Patients: EntrepreneurPulse.com.au  Fact Sheet for Healthcare Providers: IncredibleEmployment.be  This test is not yet approved or cleared by the [place] FDA and has been authorized for detection and/or diagnosis of SARS-CoV-2 by FDA under an Emergency Use  Authorization (EUA). This EUA will remain in effect (meaning this test can be used) for the duration of the COVID-19 declaration under Section 564(b)(1) of the Act, 21 U.S.C. section 360bbb-3(b)(1), unless the authorization is terminated or revoked.  Performed at [facility], [address], [place],  [address]       Management plans discussed with the patient, family (son yesterday) and they are in agreement.  I left message for son  today.  CODE STATUS:  Code Status History     Date Active Date Inactive Code Status Order ID Comments User Context   07/28/2021 1530 08/03/2021 1641 Full Code [id number]  Collier Bullock, MD ED      Questions for Most Recent Historical Code Status (Order [id number])        TOTAL TIME TAKING CARE OF THIS PATIENT: 34 minutes.    Loletha Grayer M.D on 08/03/2021 at 4:44 PM   [facility]  CC: Primary care physician; [name], MD (Inactive)

## 2021-08-03 NOTE — TOC Transition Note (Signed)
Transition of Care [facility]) - CM/SW Discharge Note   Patient Details  Name: Jacob [name] MRN: [id number] Date of Birth: June 15, 1934  Transition of Care [facility]) CM/SW Contact:  Beverly Sessions, RN Phone Number: 08/03/2021, 9:31 AM   Clinical Narrative:    Patient will DC [id number] wit home health service through [facility].  Cory with [facility] notified of discharge.  Anticipated DC date:08/03/21 Family notified:[employer] [id number] adamant that he is going to drive himself.  MD and bedside RN notified.  Son states he will come up assist the patient getting his items in to the care   Hyrum with [employer] to deliver [facility] and RW to room TOC signing off.  Isaias Cowman [facility] [phone]    Final next level of care: Home w Home Health Services Barriers to Discharge: Barriers Resolved   Patient Goals and CMS Choice        Discharge Placement                       Discharge Plan and Services     Post Acute Care Choice:  (TBD)                    HH Arranged: PT, OT, RN, Nurse's Aide, Social Work [employer] Agency: [place] Date [facility] Agency Contacted: 08/03/21   Representative spoke with at [place]: [place] ([place]) Interventions     Readmission Risk Interventions No flowsheet data found.

## 2021-08-17 ENCOUNTER — Other Ambulatory Visit: Payer: Self-pay

## 2021-08-17 ENCOUNTER — Encounter: Payer: Self-pay | Admitting: Nurse Practitioner

## 2021-08-17 ENCOUNTER — Inpatient Hospital Stay: Payer: Medicare Other | Attending: Nurse Practitioner | Admitting: Nurse Practitioner

## 2021-08-17 VITALS — BP 126/79 | HR 44 | Temp 98.0°F | Resp 20 | Wt 130.0 lb

## 2021-08-17 DIAGNOSIS — N400 Enlarged prostate without lower urinary tract symptoms: Secondary | ICD-10-CM | POA: Diagnosis not present

## 2021-08-17 DIAGNOSIS — N182 Chronic kidney disease, stage 2 (mild): Secondary | ICD-10-CM | POA: Diagnosis not present

## 2021-08-17 DIAGNOSIS — Z7189 Other specified counseling: Secondary | ICD-10-CM | POA: Diagnosis not present

## 2021-08-17 DIAGNOSIS — F17210 Nicotine dependence, cigarettes, uncomplicated: Secondary | ICD-10-CM | POA: Insufficient documentation

## 2021-08-17 DIAGNOSIS — Z803 Family history of malignant neoplasm of breast: Secondary | ICD-10-CM | POA: Insufficient documentation

## 2021-08-17 DIAGNOSIS — C7951 Secondary malignant neoplasm of bone: Secondary | ICD-10-CM | POA: Diagnosis not present

## 2021-08-17 DIAGNOSIS — C61 Malignant neoplasm of prostate: Secondary | ICD-10-CM | POA: Diagnosis not present

## 2021-08-17 DIAGNOSIS — J441 Chronic obstructive pulmonary disease with (acute) exacerbation: Secondary | ICD-10-CM | POA: Diagnosis not present

## 2021-08-17 DIAGNOSIS — Z8042 Family history of malignant neoplasm of prostate: Secondary | ICD-10-CM | POA: Insufficient documentation

## 2021-08-17 DIAGNOSIS — R531 Weakness: Secondary | ICD-10-CM | POA: Diagnosis not present

## 2021-08-17 DIAGNOSIS — I129 Hypertensive chronic kidney disease with stage 1 through stage 4 chronic kidney disease, or unspecified chronic kidney disease: Secondary | ICD-10-CM | POA: Diagnosis not present

## 2021-08-17 NOTE — Progress Notes (Signed)
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  Hyperemia may also suggest infection/proctitis.  This was followed by a colonoscopy on 01/06/2021 which showed a near obstructing mass in the rectum 1 cm from the anal verge.  Scope could not be traversed beyond the mass.  Biopsy was taken and was positive for adenocarcinoma.  Patient is here with his son today and reports that he can still move his bowels but sometimes he has constipation and sometimes diarrhea.  He does not have a good insight into his medical problems.  Denies any significant pain but does report occasional rectal discomfort. He is independent of his ADLs   Rectal biopsy showed adenocarcinoma poorly differentiated.  Tumor was positive for PSA and PSAP but negative for CK7, CK20 and CDX2.  Findings are consistent with adenocarcinoma from prostate origin.  Interval history-patient returns to clinic for follow-up after recent hospitalization.  He presented to ER on 07/28/2021 for shortness of breath progressively worsening.  Initially, pulse ox was 84% on room air.  He was found to have bilateral pneumonia on CT scan and received 5 days of Rocephin and Zithromax.  Thought to have COPD exacerbation as well and was  treated with steroids and nebulizers.  Has had progressive weakness and weight loss.  Physical therapy recommended rehab but was denied by insurance.  He was sent home with home health.  By his report home health was unable to come out due to bedbugs.  His son cleaned up over the weekend so that exterminator could come. He feels weak and says he needs help getting things done around the house. No fever or chills.   ECOG PS- 2 Pain scale- 0  Review of systems- Review of Systems  Constitutional:  Positive for malaise/fatigue. Negative for chills, fever and weight loss.  HENT:  Negative for congestion, ear discharge and nosebleeds.   Eyes:  Negative for blurred vision.  Respiratory:  Negative for cough, hemoptysis, sputum production, shortness of breath and wheezing.   Cardiovascular:  Negative for chest pain, palpitations, orthopnea and claudication.  Gastrointestinal:  Negative for abdominal pain, blood in stool, constipation, diarrhea, heartburn, melena, nausea and vomiting.  Genitourinary:  Negative for dysuria, flank pain, frequency, hematuria and urgency.  Musculoskeletal:  Negative for back pain, joint pain and myalgias.  Skin:  Negative for rash.  Neurological:  Positive for weakness. Negative for dizziness, tingling, focal weakness, seizures and headaches.  Endo/Heme/Allergies:  Does not bruise/bleed easily.  Psychiatric/Behavioral:  Negative for depression and suicidal ideas. The patient does not have insomnia.     No Known Allergies   Past Medical History:  Diagnosis Date   BPH (benign prostatic hyperplasia)    Cancer (HCC)    Chronic constipation 06/25/2015   COPD (chronic obstructive pulmonary disease) ([place])    pt denies.   Family  history of breast cancer    Family history of prostate cancer    Hypertension    Swelling of lower extremity    bilateral   Wears dentures    full upper and lower - do not fit well.     Past Surgical History:  Procedure Laterality Date    CATARACT EXTRACTION W/PHACO Right 09/30/2015   Procedure: CATARACT EXTRACTION PHACO AND INTRAOCULAR LENS PLACEMENT (IOC);  Surgeon: Leandrew Koyanagi, MD;  Location: [place];  Service: Ophthalmology;  Laterality: Right;   COLONOSCOPY  2009?   COLONOSCOPY WITH PROPOFOL N/A 01/06/2021   Procedure: COLONOSCOPY WITH PROPOFOL;  Surgeon: Lin Landsman, MD;  Location: [facility] ENDOSCOPY;  Service: Gastroenterology;  Laterality: N/A;   HERNIA REPAIR Right    x2     Social History   Socioeconomic History   Marital status: Married    Spouse name: Not on file   Number of children: Not on file   Years of education: Not on file   Highest education level: Not on file  Occupational History   Not on file  Tobacco Use   Smoking status: Every Day    Packs/day: 0.25    Years: 45.00    Pack years: 11.25    Types: Cigarettes   Smokeless tobacco: Never  Vaping Use   Vaping Use: Never used  Substance and Sexual Activity   Alcohol use: Yes    Comment: drink beer maybe 2 - 3 weeks   Drug use: No   Sexual activity: Not on file  Other Topics Concern   Not on file  Social History Narrative   Not on file   Social Determinants of Health   Financial Resource Strain: Not on file  Food Insecurity: Not on file  Transportation Needs: Not on file  Physical Activity: Not on file  Stress: Not on file  Social Connections: Not on file  Intimate Partner Violence: Not on file    Family History  Problem Relation Age of Onset   Prostate cancer Brother    Alzheimer's disease Mother    Breast cancer Mother        dx 47 or 53   Stroke Father    Heart attack Daughter    Kidney cancer Neg Hx    Kidney disease Neg Hx    Bladder Cancer Neg Hx      Current Outpatient Medications:    acetaminophen (TYLENOL) 500 MG tablet, Take 500 mg by mouth every 4 (four) hours as needed., Disp: , Rfl:    amitriptyline (ELAVIL) 10 MG tablet, 10 mg at bedtime., Disp: , Rfl:    amLODipine (NORVASC) 10 MG  tablet, Take 5 mg by mouth daily., Disp: , Rfl:    aspirin 81 MG tablet, Take 81 mg by mouth daily. PM, Disp: , Rfl:    carbidopa-levodopa (SINEMET IR) 25-100 MG tablet, Take 1 tablet by mouth 3 (three) times daily., Disp: , Rfl:    cetirizine (ZYRTEC) 10 MG tablet, Take 10 mg by mouth daily as needed., Disp: , Rfl:    cyanocobalamin 500 MCG tablet, Take 500 mcg by mouth daily. PM, Disp: , Rfl:    feeding supplement (ENSURE ENLIVE / ENSURE PLUS) LIQD, Take 237 mLs by mouth 3 (three) times daily between meals., Disp: 21330 mL, Rfl: 0   finasteride (PROSCAR) 5 MG tablet, TAKE 1 TABLET BY MOUTH EVERY DAY, Disp: 90 tablet, Rfl: 3   gabapentin (NEURONTIN) 100 MG capsule, Take 100 mg by mouth 2 (two) times  daily., Disp: , Rfl:    metoprolol succinate (TOPROL-XL) 50 MG 24 hr tablet, Take 50 mg by mouth daily., Disp: , Rfl:    mirtazapine (REMERON) 7.5 MG tablet, Take 7.5 mg by mouth at bedtime., Disp: , Rfl:    polyethylene glycol (MIRALAX / GLYCOLAX) 17 g packet, Take 17 g by mouth daily as needed for severe constipation., Disp: 14 each, Rfl: 0   Propylene Glycol (SYSTANE BALANCE) 0.6 % SOLN, INSTILL 3 5 TIMES A DAY IN EACH EYE AS NEEDED FOR DRYNESS., Disp: , Rfl:    tamsulosin (FLOMAX) 0.4 MG CAPS capsule, TAKE 1 CAPSULE BY MOUTH EVERY DAY, Disp: 90 capsule, Rfl: 3   torsemide (DEMADEX) 10 MG tablet, Take 1 tablet by mouth daily., Disp: , Rfl:   Physical exam:  Vitals:   08/17/21 1323  BP: 126/79  Pulse: (!) 44  Resp: 20  Temp: 98 F (36.7 C)  SpO2: 100%  Weight: 130 lb (59 kg)   Physical Exam Constitutional:      Comments: Sitting in a wheelchair.  Appears in no acute distress  Cardiovascular:     Rate and Rhythm: Normal rate and regular rhythm.     Heart sounds: Normal heart sounds.  Pulmonary:     Effort: Pulmonary effort is normal.     Breath sounds: Normal breath sounds.  Abdominal:     General: Bowel sounds are normal.     Palpations: Abdomen is soft.  Skin:    General: Skin  is warm and dry.     Findings: No lesion or rash.  Neurological:     Mental Status: He is alert and oriented to person, place, and time.  Psychiatric:        Mood and Affect: Mood normal.        Behavior: Behavior normal.     CMP Latest Ref Rng & Units 07/31/2021  Glucose 70 - 99 mg/dL 116(H)  BUN 8 - 23 mg/dL 27(H)  Creatinine 0.61 - 1.24 mg/dL 0.96  Sodium 135 - 145 mmol/L 139  Potassium 3.5 - 5.1 mmol/L 3.7  Chloride 98 - 111 mmol/L 101  CO2 22 - 32 mmol/L 29  Calcium 8.9 - 10.3 mg/dL 8.9  Total Protein 6.5 - 8.1 g/dL -  Total Bilirubin 0.3 - 1.2 mg/dL -  Alkaline Phos 38 - 126 U/L -  AST 15 - 41 U/L -  ALT 0 - 44 U/L -   CBC Latest Ref Rng & Units 07/31/2021  WBC 4.0 - 10.5 K/uL 11.2(H)  Hemoglobin 13.0 - 17.0 g/dL 15.4  Hematocrit 39.0 - 52.0 % 47.9  Platelets 150 - 400 K/uL 383    No images are attached to the encounter.  DG Chest 2 View  Result Date: 07/28/2021 CLINICAL DATA:  Cough shortness of breath. Prostate cancer history in this 87 year old male. EXAM: CHEST - 2 VIEW COMPARISON:  Examination of October 08, 2020. FINDINGS: Trachea midline. Cardiomediastinal contours and hilar structures are stable. No lobar consolidation or sign of pleural effusion. Lungs are hyperinflated mildly. Tortuous thoracic aorta with similar appearance. On limited assessment no acute skeletal process. No visible pneumothorax. Signs of skin folds projecting over the RIGHT mid chest. IMPRESSION: No active cardiopulmonary disease. Electronically Signed   By: Zetta Bills M.D.   On: 07/28/2021 12:36   CT Angio Chest PE W and/or Wo Contrast  Result Date: 07/28/2021 CLINICAL DATA:  Pulmonary emboli suspected, high probability. Shortness of breath. Prostate cancer. EXAM: CT ANGIOGRAPHY CHEST WITH CONTRAST TECHNIQUE:  Multidetector CT imaging of the chest was performed using the standard protocol during bolus administration of intravenous contrast. Multiplanar CT image reconstructions and MIPs were  obtained to evaluate the vascular anatomy. CONTRAST:  45m OMNIPAQUE IOHEXOL 350 MG/ML SOLN COMPARISON:  Chest radiography same day. FINDINGS: Cardiovascular: Heart size is normal. No pericardial fluid. No visible coronary artery calcification. Mild aortic atherosclerotic calcification and tortuosity. Pulmonary arterial opacification is good. There are no pulmonary emboli. Mediastinum/Nodes: No mediastinal or hilar mass or lymphadenopathy. Lungs/Pleura: No pleural effusion. Underlying emphysema and pulmonary scarring. There are scattered areas of hazy opacity in both lungs which were not present in February of this year, suggesting early pneumonia. No dense consolidation or lobar collapse. Upper Abdomen: Multiple cysts of the kidneys as seen previously. 1 cm liver cyst at the dome. Musculoskeletal: Scoliosis and degenerative change of the spine. Multiple sclerotic foci throughout the regional skeleton consistent with known osseous metastatic disease. This is progressive since February. Review of the MIP images confirms the above findings. IMPRESSION: No pulmonary emboli. Aortic Atherosclerosis (ICD10-I70.0) and Emphysema (ICD10-J43.9). Increased hazy pulmonary opacities scattered within both lungs, not present in February, favored to represent mild/early pneumonia. No dense consolidation or lobar collapse. Progression of sclerotic osseous metastatic disease without evidence of fracture or lytic destructive lesion. Electronically Signed   By: MNelson ChimesM.D.   On: 07/28/2021 14:47   ECHOCARDIOGRAM COMPLETE  Result Date: 07/30/2021    ECHOCARDIOGRAM REPORT   Patient Name:   RJAHMEEK PECHACEKDate of Exam: 07/30/2021 Medical Rec #:  [id number]    Height:       72.0 in Accession #:    [id number]   Weight:       127.9 lb Date of Birth:  4Mar 13, 1935     BSA:          1.762 m Patient Age:    89years      BP:           124/69 mmHg Patient Gender: M             HR:           61 bpm. Exam Location:  [facility] Procedure: 2D Echo,  Cardiac Doppler and Color Doppler Indications:    CHF-Acute diastolic [id number] History:        Patient has no prior history of Echocardiogram examinations.                 COPD.  Sonographer:    JSherrie SportReferring Phys: [id number] Diagnosing      AIsaias CowmanMD Phys:  Sonographer Comments: No parasternal window. Image acquisition challenging due to patient body habitus. IMPRESSIONS  1. Left ventricular ejection fraction, by estimation, is 50 to 55%. The left ventricle has low normal function. The left ventricle has no regional wall motion abnormalities. Left ventricular diastolic parameters are consistent with Grade I diastolic dysfunction (impaired relaxation).  2. Right ventricular systolic function is normal. The right ventricular size is normal.  3. The mitral valve is normal in structure. Mild mitral valve regurgitation. No evidence of mitral stenosis.  4. The aortic valve is normal in structure. Aortic valve regurgitation is not visualized. No aortic stenosis is present.  5. The inferior vena cava is normal in size with greater than 50% respiratory variability, suggesting right atrial pressure of 3 mmHg. FINDINGS  Left Ventricle: Left ventricular ejection fraction, by estimation, is 50 to 55%. The left ventricle has low normal function. The  left ventricle has no regional wall motion abnormalities. The left ventricular internal cavity size was normal in size. There is no left ventricular hypertrophy. Left ventricular diastolic parameters are consistent with Grade I diastolic dysfunction (impaired relaxation). Right Ventricle: The right ventricular size is normal. No increase in right ventricular wall thickness. Right ventricular systolic function is normal. Left Atrium: Left atrial size was normal in size. Right Atrium: Right atrial size was normal in size. Pericardium: There is no evidence of pericardial effusion. Mitral Valve: The mitral valve is normal in structure. Mild mitral valve  regurgitation. No evidence of mitral valve stenosis. Tricuspid Valve: The tricuspid valve is normal in structure. Tricuspid valve regurgitation is mild . No evidence of tricuspid stenosis. Aortic Valve: The aortic valve is normal in structure. Aortic valve regurgitation is not visualized. No aortic stenosis is present. Aortic valve mean gradient measures 4.0 mmHg. Aortic valve peak gradient measures 7.0 mmHg. Aortic valve area, by VTI measures 1.41 cm. Pulmonic Valve: The pulmonic valve was normal in structure. Pulmonic valve regurgitation is not visualized. No evidence of pulmonic stenosis. Aorta: The aortic root is normal in size and structure. Venous: The inferior vena cava is normal in size with greater than 50% respiratory variability, suggesting right atrial pressure of 3 mmHg. IAS/Shunts: No atrial level shunt detected by color flow Doppler.  LEFT VENTRICLE PLAX 2D LVIDd:         4.08 cm  Diastology LVIDs:         3.02 cm  LV e' medial:    2.83 cm/s LV PW:         1.05 cm  LV E/e' medial:  20.1 LV IVS:        1.02 cm  LV e' lateral:   8.92 cm/s LVOT diam:     2.00 cm  LV E/e' lateral: 6.4 LV SV:         35 LV SV Index:   20 LVOT Area:     3.14 cm  RIGHT VENTRICLE TAPSE (M-mode): 3.6 cm LEFT ATRIUM           Index       RIGHT ATRIUM           Index LA diam:      1.90 cm 1.08 cm/m  RA Area:     17.20 cm LA Vol (A2C): 91.8 ml 52.11 ml/m RA Volume:   44.10 ml  25.03 ml/m LA Vol (A4C): 37.7 ml 21.40 ml/m  AORTIC VALVE AV Area (Vmax):    1.24 cm AV Area (Vmean):   1.32 cm AV Area (VTI):     1.41 cm AV Vmax:           132.00 cm/s AV Vmean:          88.300 cm/s AV VTI:            0.245 m AV Peak Grad:      7.0 mmHg AV Mean Grad:      4.0 mmHg LVOT Vmax:         52.20 cm/s LVOT Vmean:        37.200 cm/s LVOT VTI:          0.110 m LVOT/AV VTI ratio: 0.45  AORTA Ao Root diam: 2.40 cm MITRAL VALVE               TRICUSPID VALVE MV Area (PHT): 2.91 cm    TR Peak grad:   7.6 mmHg MV Decel Time: 261 msec  TR  Vmax:        138.00 cm/s MV E velocity: 57.00 cm/s MV A velocity: 89.10 cm/s  SHUNTS MV E/A ratio:  0.64        Systemic VTI:  0.11 m                            Systemic Diam: 2.00 cm Isaias Cowman MD Electronically signed by Isaias Cowman MD Signature Date/Time: 07/30/2021/2:19:15 PM    Final      Assessment and plan- Patient is a 87 y.o. 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   He is s/p rocephin and zithromax for bilateral pneumonia and copd exacerbation. He has persistent weakness and rehab was recommended but not approved by insurance. I feel he needs home health but due to bed bugs they are unable to initiate services. Today we assisted him with scheduling exterminator to come out and notified home health of date that would be performed. We've given him nutritional supplements for home use.   His PSA is rising and he is increasingly weak. However, he is a poor candidate for oral medications. Discussed with Dr. Janese Banks who recommends continuing lupron at this time. Patient verbalizes understanding and agrees. He is interested in going to rehab/facility in the future if needed. He would benefit from home health and may be able to reconsider oral treatment in the future if living situation improves. May also consider home palliative care.   He will follow up with Dr. Janese Banks as scheduled for further evaluation and consideration of lupron.   Visit Diagnosis 1. Prostate cancer ([place])   2. Goals of care, counseling/discussion   3. Weakness   4. COPD with acute exacerbation ([place])    Beckey Rutter, [place], AGNP-C [place] at [facility] [phone] (clinic) 08/17/2021

## 2021-08-20 ENCOUNTER — Encounter: Payer: Self-pay | Admitting: Oncology

## 2021-08-30 ENCOUNTER — Telehealth: Payer: Self-pay | Admitting: Student

## 2021-08-30 NOTE — Telephone Encounter (Signed)
Spoke with patient and have rescheduled the 07/15/21 (no show) Palliative f/u visit for 08/31/21 @ 2:30 PM.  Confirmed with patient that he was going to be home at this time.

## 2021-08-31 ENCOUNTER — Other Ambulatory Visit: Payer: Medicare Other | Admitting: Student

## 2021-08-31 ENCOUNTER — Other Ambulatory Visit: Payer: Self-pay

## 2021-08-31 DIAGNOSIS — Z515 Encounter for palliative care: Secondary | ICD-10-CM

## 2021-08-31 DIAGNOSIS — R52 Pain, unspecified: Secondary | ICD-10-CM

## 2021-08-31 DIAGNOSIS — E43 Unspecified severe protein-calorie malnutrition: Secondary | ICD-10-CM

## 2021-08-31 DIAGNOSIS — R531 Weakness: Secondary | ICD-10-CM

## 2021-08-31 DIAGNOSIS — C61 Malignant neoplasm of prostate: Secondary | ICD-10-CM

## 2021-08-31 NOTE — Progress Notes (Signed)
[place] Consult Note Telephone: [phone]  Fax: [phone]    Date of encounter: 08/31/21 4:35 PM PATIENT NAME: Jacob [name] [place] [address]   [phone] (home)  DOB: [birth year] MRN: [id number] PRIMARY CARE PROVIDER:      REFERRING PROVIDER:   Dr. Billey Gosling  RESPONSIBLE PARTY:    Contact Information     Name Relation Home Work Mobile   Jacob [name], Jacob [name] Son   [phone]   Jacob [name], Jacob [name] Spouse   [phone]        I met face to face with patient  in the home. Palliative Care was asked to follow this patient by consultation request of  Dr. Billey Gosling to address advance care planning and complex medical decision making. This is a follow up visit.                                   ASSESSMENT AND PLAN / RECOMMENDATIONS:   Advance Care Planning/Goals of Care: Goals include to maximize quality of life and symptom management. Our advance care planning conversation included a discussion about:     CODE STATUS: Full Code  Symptom Management/Plan:  Metastatic prostate cancer-patient not currently receiving any treatment. He is to follow up with oncology as scheduled. PSA continues to rise; PSA 201.00 on 07/29/21.  Pain-patient c/o lower back pain. He takes PO medications PRN and states he does not like too many medications. Start lidocaine 4% patch; apply to lower back, on 12 hours/off 12 hours.  Weakness-patient with worsening weakness and fatigue. He states he is open to going to an AL or higher level of care. Will refer to Palliative SW to see if she can assist with this process. Patient is requiring more assistance in the home, bed bugs in the home, unfortunate living conditions.  Protein calorie malnutrition-continue Ensure BID, foods patient enjoys are recommended. Patient declines need for support such as Meals on Wheels.  Palliative has made multiple attempts to see patient in the past. He  does state it is okay to stop by if appointment is scheduled and he does not answer his phone.   Follow up Palliative Care Visit: Palliative care will continue to follow for complex medical decision making, advance care planning, and clarification of goals. Return 4 weeks or prn.  I spent 40 minutes providing this consultation. More than 50% of the time in this consultation was spent in counseling and care coordination.   PPS: 50%  HOSPICE ELIGIBILITY/DIAGNOSIS: TBD  Chief Complaint: Palliative medicine follow-up visit.  HISTORY OF PRESENT ILLNESS:  Jacob [name] is a 87 y.o. year old male  with prostate cancer with bone metastasis, BPH, COPD, chronic bronchitis, essential hypertension, CKD stage II, tremor, protein calorie malnutrition. Patient recently hospitalized 8/17-8/23/22 due to CAP, acute respiratory failure with hypoxia.  Patient endorses fatigue, weakness.  He states that home health has not been able to come out due to bedbugs.  Patient states he did have 1-2 rooms treated, but the house needs to be cleaned before they will return to treat further. He states someone is supposed to clean, but they haven't come yet. Patient endorses that he is needing more assistance and would like to move to assisted living. He states he would like to stay at home, but acknowledges he is needing more assistance. Has 4 sons; one son currently checks on him 1-2 times  a week.  Patient drives himself to appointments.  He states he is able to prepare small meals and get groceries for himself.  He reports lower back pain. He is taking acetaminophen or ibuprofen PRN. He endorses having a fall about 5 months ago. Occasional shortness of breath with exertion. He reports a poor to fair appetite. He is drinking 1-2 supplements a day. He states his healthy weight was around 170 pounds. Endorses a 5 pound loss in the past month. Patient states his wife is currently at [facility].   Patient sitting in  his car once he arrived home from getting lunch. He had difficulty getting out of his car; due to weakness. Majority of visit was performed outside. Patient had difficulty getting out of his car, states due to weakness. NP had to assist patient into his home.  Attempted to review medications on hand patient states that his medications are throughout his home.   History obtained from review of EMR, discussion with primary team, and interview with family, facility staff/caregiver and/or Jacob [name].  I reviewed available labs, medications, imaging, studies and related documents from the EMR.  Records reviewed and summarized above.   ROS  General: NAD EYES: denies vision changes ENMT: denies dysphagia Cardiovascular: denies chest pain Pulmonary: denies cough, SOB with exertion Abdomen: occasional constipation, endorses continence of bowel GU: denies dysuria MSK: + weakness,  no recent falls reported Skin: denies rashes or wounds Neurological: denies pain, denies insomnia Psych: Endorses stable mood Heme/lymph/immuno: denies bruises, abnormal bleeding  Physical Exam: Pulse 64, resp 18, b/p 122/72, sats 97% on room air Constitutional: NAD General: frail appearing, thin EYES: anicteric sclera, lids intact, no discharge  ENMT: intact hearing, oral mucous membranes moist, dentition intact CV: S1S2, RRR, no LE edema Pulmonary: lungs clear, right base slightly diminished,  no increased work of breathing, no cough Abdomen: normo-active BS + 4 quadrants, soft and non tender GU: deferred MSK: sarcopenia, moves all extremities, ambulatory with cane Skin: warm and dry, no rashes or wounds on visible skin Neuro: generalized weakness, tremor, A & O x 3 Psych: non-anxious affect Hem/lymph/immuno: no widespread bruising   Thank you for the opportunity to participate in the care of Jacob [name].  The palliative care team will continue to follow. Please call our office at [phone] if we can be of  additional assistance.   Ezekiel Slocumb, NP   COVID-19 PATIENT SCREENING TOOL Asked and negative response unless otherwise noted:   Have you had symptoms of covid, tested positive or been in contact with someone with symptoms/positive test in the past 5-10 days? No

## 2021-09-05 ENCOUNTER — Inpatient Hospital Stay
Admission: EM | Admit: 2021-09-05 | Discharge: 2021-09-10 | DRG: 542 | Disposition: A | Discharge status: Skilled Nursing Facility | Payer: Medicare Other | Attending: Internal Medicine | Admitting: Internal Medicine

## 2021-09-05 ENCOUNTER — Emergency Department: Payer: Medicare Other

## 2021-09-05 ENCOUNTER — Observation Stay: Payer: Medicare Other

## 2021-09-05 ENCOUNTER — Other Ambulatory Visit: Payer: Self-pay

## 2021-09-05 DIAGNOSIS — Z820 Family history of epilepsy and other diseases of the nervous system: Secondary | ICD-10-CM

## 2021-09-05 DIAGNOSIS — R5381 Other malaise: Secondary | ICD-10-CM | POA: Diagnosis present

## 2021-09-05 DIAGNOSIS — Z789 Other specified health status: Secondary | ICD-10-CM

## 2021-09-05 DIAGNOSIS — E869 Volume depletion, unspecified: Secondary | ICD-10-CM | POA: Diagnosis present

## 2021-09-05 DIAGNOSIS — K5909 Other constipation: Secondary | ICD-10-CM | POA: Diagnosis present

## 2021-09-05 DIAGNOSIS — N400 Enlarged prostate without lower urinary tract symptoms: Secondary | ICD-10-CM | POA: Diagnosis present

## 2021-09-05 DIAGNOSIS — M545 Low back pain, unspecified: Secondary | ICD-10-CM | POA: Diagnosis not present

## 2021-09-05 DIAGNOSIS — J432 Centrilobular emphysema: Secondary | ICD-10-CM | POA: Diagnosis present

## 2021-09-05 DIAGNOSIS — G893 Neoplasm related pain (acute) (chronic): Secondary | ICD-10-CM | POA: Diagnosis present

## 2021-09-05 DIAGNOSIS — I248 Other forms of acute ischemic heart disease: Secondary | ICD-10-CM | POA: Diagnosis present

## 2021-09-05 DIAGNOSIS — Z20822 Contact with and (suspected) exposure to covid-19: Secondary | ICD-10-CM | POA: Diagnosis present

## 2021-09-05 DIAGNOSIS — G20 Parkinson's disease: Secondary | ICD-10-CM | POA: Diagnosis present

## 2021-09-05 DIAGNOSIS — Z681 Body mass index (BMI) 19 or less, adult: Secondary | ICD-10-CM

## 2021-09-05 DIAGNOSIS — C7951 Secondary malignant neoplasm of bone: Secondary | ICD-10-CM | POA: Diagnosis not present

## 2021-09-05 DIAGNOSIS — Z79899 Other long term (current) drug therapy: Secondary | ICD-10-CM

## 2021-09-05 DIAGNOSIS — E785 Hyperlipidemia, unspecified: Secondary | ICD-10-CM | POA: Diagnosis present

## 2021-09-05 DIAGNOSIS — R627 Adult failure to thrive: Secondary | ICD-10-CM | POA: Diagnosis not present

## 2021-09-05 DIAGNOSIS — R008 Other abnormalities of heart beat: Secondary | ICD-10-CM | POA: Diagnosis present

## 2021-09-05 DIAGNOSIS — F419 Anxiety disorder, unspecified: Secondary | ICD-10-CM | POA: Diagnosis present

## 2021-09-05 DIAGNOSIS — Z923 Personal history of irradiation: Secondary | ICD-10-CM

## 2021-09-05 DIAGNOSIS — M4856XA Collapsed vertebra, not elsewhere classified, lumbar region, initial encounter for fracture: Secondary | ICD-10-CM | POA: Diagnosis present

## 2021-09-05 DIAGNOSIS — M4854XA Collapsed vertebra, not elsewhere classified, thoracic region, initial encounter for fracture: Secondary | ICD-10-CM | POA: Diagnosis present

## 2021-09-05 DIAGNOSIS — F32A Depression, unspecified: Secondary | ICD-10-CM | POA: Diagnosis present

## 2021-09-05 DIAGNOSIS — J441 Chronic obstructive pulmonary disease with (acute) exacerbation: Secondary | ICD-10-CM | POA: Diagnosis present

## 2021-09-05 DIAGNOSIS — Z23 Encounter for immunization: Secondary | ICD-10-CM

## 2021-09-05 DIAGNOSIS — Z8249 Family history of ischemic heart disease and other diseases of the circulatory system: Secondary | ICD-10-CM

## 2021-09-05 DIAGNOSIS — N179 Acute kidney failure, unspecified: Secondary | ICD-10-CM | POA: Diagnosis present

## 2021-09-05 DIAGNOSIS — E538 Deficiency of other specified B group vitamins: Secondary | ICD-10-CM | POA: Diagnosis present

## 2021-09-05 DIAGNOSIS — I10 Essential (primary) hypertension: Secondary | ICD-10-CM | POA: Diagnosis present

## 2021-09-05 DIAGNOSIS — M549 Dorsalgia, unspecified: Secondary | ICD-10-CM | POA: Diagnosis present

## 2021-09-05 DIAGNOSIS — G629 Polyneuropathy, unspecified: Secondary | ICD-10-CM | POA: Diagnosis present

## 2021-09-05 DIAGNOSIS — R531 Weakness: Secondary | ICD-10-CM

## 2021-09-05 DIAGNOSIS — F17210 Nicotine dependence, cigarettes, uncomplicated: Secondary | ICD-10-CM | POA: Diagnosis present

## 2021-09-05 DIAGNOSIS — Z8701 Personal history of pneumonia (recurrent): Secondary | ICD-10-CM

## 2021-09-05 DIAGNOSIS — I493 Ventricular premature depolarization: Secondary | ICD-10-CM | POA: Diagnosis present

## 2021-09-05 DIAGNOSIS — E43 Unspecified severe protein-calorie malnutrition: Secondary | ICD-10-CM | POA: Diagnosis present

## 2021-09-05 DIAGNOSIS — Z7982 Long term (current) use of aspirin: Secondary | ICD-10-CM

## 2021-09-05 DIAGNOSIS — C61 Malignant neoplasm of prostate: Secondary | ICD-10-CM | POA: Diagnosis present

## 2021-09-05 DIAGNOSIS — Z8042 Family history of malignant neoplasm of prostate: Secondary | ICD-10-CM

## 2021-09-05 DIAGNOSIS — Z972 Presence of dental prosthetic device (complete) (partial): Secondary | ICD-10-CM

## 2021-09-05 DIAGNOSIS — Z515 Encounter for palliative care: Secondary | ICD-10-CM

## 2021-09-05 LAB — CBC
HCT: 48.6 % (ref 39.0–52.0)
Hemoglobin: 15.6 g/dL (ref 13.0–17.0)
MCH: 27.3 pg (ref 26.0–34.0)
MCHC: 32.1 g/dL (ref 30.0–36.0)
MCV: 85.1 fL (ref 80.0–100.0)
Platelets: 393 10*3/uL (ref 150–400)
RBC: 5.71 MIL/uL (ref 4.22–5.81)
RDW: 14.8 % (ref 11.5–15.5)
WBC: 9.5 10*3/uL (ref 4.0–10.5)
nRBC: 0 % (ref 0.0–0.2)

## 2021-09-05 LAB — COMPREHENSIVE METABOLIC PANEL
ALT: 8 U/L (ref 0–44)
AST: 26 U/L (ref 15–41)
Albumin: 3.3 g/dL — ABNORMAL LOW (ref 3.5–5.0)
Alkaline Phosphatase: 124 U/L (ref 38–126)
Anion gap: 14 (ref 5–15)
BUN: 36 mg/dL — ABNORMAL HIGH (ref 8–23)
CO2: 32 mmol/L (ref 22–32)
Calcium: 9.9 mg/dL (ref 8.9–10.3)
Chloride: 94 mmol/L — ABNORMAL LOW (ref 98–111)
Creatinine, Ser: 1.36 mg/dL — ABNORMAL HIGH (ref 0.61–1.24)
GFR, Estimated: 51 mL/min — ABNORMAL LOW (ref >60)
Glucose, Bld: 146 mg/dL — ABNORMAL HIGH (ref 70–99)
Potassium: 4.4 mmol/L (ref 3.5–5.1)
Sodium: 140 mmol/L (ref 135–145)
Total Bilirubin: 1.4 mg/dL — ABNORMAL HIGH (ref 0.3–1.2)
Total Protein: 7.6 g/dL (ref 6.5–8.1)

## 2021-09-05 LAB — RESP PANEL BY RT-PCR (FLU A&B, COVID) ARPGX2
Influenza A by PCR: NEGATIVE
Influenza B by PCR: NEGATIVE
SARS Coronavirus 2 by RT PCR: NEGATIVE

## 2021-09-05 LAB — URINALYSIS, COMPLETE (UACMP) WITH MICROSCOPIC
Bacteria, UA: NONE SEEN
Bilirubin Urine: NEGATIVE
Glucose, UA: NEGATIVE mg/dL
Hgb urine dipstick: NEGATIVE
Ketones, ur: NEGATIVE mg/dL
Leukocytes,Ua: NEGATIVE
Nitrite: NEGATIVE
Protein, ur: 30 mg/dL — AB
Specific Gravity, Urine: 1.044 — ABNORMAL HIGH (ref 1.005–1.030)
Squamous Epithelial / HPF: NONE SEEN (ref 0–5)
pH: 7 (ref 5.0–8.0)

## 2021-09-05 LAB — TSH: TSH: 1.454 u[IU]/mL (ref 0.350–4.500)

## 2021-09-05 LAB — TROPONIN I (HIGH SENSITIVITY): Troponin I (High Sensitivity): 58 ng/L — ABNORMAL HIGH (ref <18)

## 2021-09-05 LAB — T4, FREE: Free T4: 1.29 ng/dL — ABNORMAL HIGH (ref 0.61–1.12)

## 2021-09-05 MED ORDER — ACETAMINOPHEN 650 MG RE SUPP: 650.0000 mg | Freq: Four times a day (QID) | RECTAL | Status: AC | PRN

## 2021-09-05 MED ORDER — FENTANYL CITRATE PF 50 MCG/ML IJ SOSY
25.0000 ug | PREFILLED_SYRINGE | Freq: Once | INTRAMUSCULAR | Status: AC
  Administered 2021-09-05: 25 ug via INTRAVENOUS
  Filled 2021-09-05: qty 1

## 2021-09-05 MED ORDER — LACTATED RINGERS IV SOLN
INTRAVENOUS | Status: DC
Start: 2021-09-05 — End: 2021-09-06

## 2021-09-05 MED ORDER — MORPHINE SULFATE (PF) 4 MG/ML IV SOLN
4.0000 mg | INTRAVENOUS | Status: AC | PRN
Start: 2021-09-05 — End: 2021-09-06

## 2021-09-05 MED ORDER — TAMSULOSIN HCL 0.4 MG PO CAPS
0.4000 mg | ORAL_CAPSULE | Freq: Every day | ORAL | Status: DC
  Administered 2021-09-06 – 2021-09-10 (×5): 0.4 mg via ORAL
  Filled 2021-09-05 (×5): qty 1

## 2021-09-05 MED ORDER — ONDANSETRON HCL 4 MG/2ML IJ SOLN
4.0000 mg | Freq: Once | INTRAMUSCULAR | Status: AC
  Administered 2021-09-05: 4 mg via INTRAVENOUS
  Filled 2021-09-05: qty 2

## 2021-09-05 MED ORDER — INFLUENZA VAC A&B SA ADJ QUAD 0.5 ML IM PRSY
0.5000 mL | PREFILLED_SYRINGE | INTRAMUSCULAR | Status: AC
  Administered 2021-09-07: 0.5 mL via INTRAMUSCULAR
  Filled 2021-09-05: qty 0.5

## 2021-09-05 MED ORDER — IOHEXOL 350 MG/ML SOLN
75.0000 mL | Freq: Once | INTRAVENOUS | Status: AC | PRN
  Administered 2021-09-05: 75 mL via INTRAVENOUS

## 2021-09-05 MED ORDER — ACETAMINOPHEN 325 MG PO TABS
650.0000 mg | ORAL_TABLET | Freq: Four times a day (QID) | ORAL | Status: AC | PRN
  Administered 2021-09-07: 20:00:00 650 mg via ORAL
  Filled 2021-09-05: qty 2

## 2021-09-05 MED ORDER — FINASTERIDE 5 MG PO TABS
5.0000 mg | ORAL_TABLET | Freq: Every day | ORAL | Status: DC
  Administered 2021-09-06 – 2021-09-10 (×5): 5 mg via ORAL
  Filled 2021-09-05 (×5): qty 1

## 2021-09-05 MED ORDER — ENOXAPARIN SODIUM 40 MG/0.4ML IJ SOSY
40.0000 mg | PREFILLED_SYRINGE | Freq: Every day | INTRAMUSCULAR | Status: DC
  Administered 2021-09-05 – 2021-09-09 (×5): 40 mg via SUBCUTANEOUS
  Filled 2021-09-05 (×5): qty 0.4

## 2021-09-05 MED ORDER — MIRTAZAPINE 15 MG PO TABS
7.5000 mg | ORAL_TABLET | Freq: Every day | ORAL | Status: DC
  Administered 2021-09-05 – 2021-09-09 (×5): 7.5 mg via ORAL
  Filled 2021-09-05 (×5): qty 1

## 2021-09-05 MED ORDER — LIDOCAINE 5 % EX PTCH
2.0000 | MEDICATED_PATCH | CUTANEOUS | Status: DC
  Administered 2021-09-05 – 2021-09-09 (×5): 2 via TRANSDERMAL
  Filled 2021-09-05 (×6): qty 2

## 2021-09-05 MED ORDER — GABAPENTIN 100 MG PO CAPS
100.0000 mg | ORAL_CAPSULE | Freq: Two times a day (BID) | ORAL | Status: DC
  Administered 2021-09-05 – 2021-09-10 (×10): 100 mg via ORAL
  Filled 2021-09-05 (×10): qty 1

## 2021-09-05 MED ORDER — FENTANYL CITRATE PF 50 MCG/ML IJ SOSY: 25.0000 ug | PREFILLED_SYRINGE | INTRAMUSCULAR | Status: DC | PRN

## 2021-09-05 MED ORDER — CARBIDOPA-LEVODOPA 25-100 MG PO TABS
1.0000 | ORAL_TABLET | Freq: Three times a day (TID) | ORAL | Status: DC
  Administered 2021-09-05 – 2021-09-10 (×14): 1 via ORAL
  Filled 2021-09-05 (×14): qty 1

## 2021-09-05 MED ORDER — ONDANSETRON HCL 4 MG PO TABS
4.0000 mg | ORAL_TABLET | Freq: Four times a day (QID) | ORAL | Status: DC | PRN
Start: 2021-09-05 — End: 2021-09-10

## 2021-09-05 MED ORDER — ENSURE ENLIVE PO LIQD
237.0000 mL | Freq: Three times a day (TID) | ORAL | Status: DC
  Administered 2021-09-06 – 2021-09-09 (×7): 237 mL via ORAL

## 2021-09-05 MED ORDER — ASPIRIN EC 81 MG PO TBEC
81.0000 mg | DELAYED_RELEASE_TABLET | Freq: Every day | ORAL | Status: DC
  Administered 2021-09-06 – 2021-09-10 (×5): 81 mg via ORAL
  Filled 2021-09-05 (×5): qty 1

## 2021-09-05 MED ORDER — SODIUM CHLORIDE 0.9 % IV BOLUS
1000.0000 mL | Freq: Once | INTRAVENOUS | Status: AC
  Administered 2021-09-05: 1000 mL via INTRAVENOUS

## 2021-09-05 MED ORDER — AMITRIPTYLINE HCL 10 MG PO TABS
10.0000 mg | ORAL_TABLET | Freq: Every day | ORAL | Status: DC
  Administered 2021-09-05 – 2021-09-09 (×5): 10 mg via ORAL
  Filled 2021-09-05 (×6): qty 1

## 2021-09-05 MED ORDER — POLYETHYLENE GLYCOL 3350 17 G PO PACK
17.0000 g | PACK | Freq: Every day | ORAL | Status: DC | PRN
  Administered 2021-09-06: 09:00:00 17 g via ORAL
  Filled 2021-09-05: qty 1

## 2021-09-05 MED ORDER — ONDANSETRON HCL 4 MG/2ML IJ SOLN: 4.0000 mg | Freq: Four times a day (QID) | INTRAMUSCULAR | Status: DC | PRN

## 2021-09-05 MED ORDER — CYANOCOBALAMIN 500 MCG PO TABS
500.0000 ug | ORAL_TABLET | Freq: Every day | ORAL | Status: DC
  Administered 2021-09-06 – 2021-09-09 (×4): 500 ug via ORAL
  Filled 2021-09-05 (×5): qty 1

## 2021-09-05 NOTE — ED Triage Notes (Addendum)
Pt to ER via POV with complaints of decreased appetite/ generalized back pain that started 2-3 weeks ago. Reports when he takes a deep breath his whole back hurts. Has been trying to drink ensures but they make him nauseous. Has been taking OTC pain medications with some relief in back pain, denies injury.   Reports having difficulty with ADL's. Son in triage w/ patient reports pt  lives alone and is having difficulty caring for himself.

## 2021-09-05 NOTE — H&P (Signed)
History and Physical   Jacob [name] VFI:[id number] DOB: 08/27/1934 DOA: 09/05/2021  PCP: [name], MD (Inactive)  Outpatient Specialists: Dr. Janese Banks Patient coming from: Home  I have personally briefly reviewed patient's old medical records in [place].  Chief Concern: Back pain  HPI: Jacob [name] is a 87 y.o. male with medical history significant for metastatic prostate cancer, hypertension, hyperlipidemia, neuropathy, Parkinson's, BPH, presents the emergency department for chief concerns of back pain.  He reports the pain is present with breathing, movement. He denies trauma. The pain is aching, 10/10 and relieved with IV pain medications given. He denies chest pain, shortness of breath, abdominal pain, dysuria, fever. He endorses nausea and poor p.o. intake. He denies vomiting, diarrhea. He denies hematuria.  Social history: He lives at home by himself.  Vaccination history: Unknown  ROS: Constitutional: + weight change, no fever ENT/Mouth: no sore throat, no rhinorrhea Eyes: no eye pain, no vision changes Cardiovascular: no chest pain, no dyspnea,  no edema, no palpitations Respiratory: no cough, no sputum, no wheezing Gastrointestinal: + nausea, no vomiting, no diarrhea, no constipation Genitourinary: no urinary incontinence, no dysuria, no hematuria Musculoskeletal: no arthralgias, no myalgias Skin: no skin lesions, no pruritus, Neuro: + weakness, no loss of consciousness, no syncope Psych: no anxiety, no depression, + decrease appetite Heme/Lymph: no bruising, no bleeding  ED Course: Discussed with emergency medicine provider, patient requiring hospitalization for chief concerns of failure to thrive and acute kidney injury.  Vitals in the emergency department was remarkable for temperature of 98.4, respiration rate of 19, heart rate of 50 and increased to 91, blood pressure 129/70, SPO2 of 95% on room air.  Labs in the emergency department was remarkable for  sodium 140, potassium 4.4, chloride 94, bicarb 32, BUN 36, serum creatinine of 1.36, nonfasting blood glucose 146.  eGFR was 51.  High sensitive troponin was 58.  In the emergency department patient was given fentanyl 25 mcg IV, ondansetron 4 mg IV, sodium chloride 1 L bolus.  Assessment/Plan  Principal Problem:   Failure to thrive in adult Active Problems:   Essential hypertension   Benign prostatic hyperplasia   Back pain   B12 deficiency   Prostate cancer metastatic to bone [facility])   Family history of prostate cancer   Parkinson disease ([place])   Depression   COPD with acute exacerbation ([place])   Weakness   Protein-calorie malnutrition, severe   AKI (acute kidney injury) ([place])   # Failure to thrive - Palliative care has been consulted via [place] feeding supplements with Ensure Enlive, p.o. 3 times daily between meals - Fall precautions, aspiration precautions  # Back pain-I suspect this is secondary to metastasis from prostate cancer - Morphine 4 mg IV every 4 hours as needed for moderate pain, 1 day ordered; fentanyl 25 mcg every 2 hours as needed for severe pain, 3 doses ordered  # Acute kidney injury-presumed prerenal secondary to poor p.o. intake and failure to thrive - Status post sodium chloride 1 L bolus - Lactated ringer at 125 mL/h, 1 day ordered - BMP in the a.m.  # Elevated troponin-presumed multifactorial in setting of acute kidney injury causing demand ischemia  # History of hypertension-patient currently normotensive at this time and given presentation of failure to thrive - I am holding amlodipine 5 mg daily, metoprolol succinate 50 mg daily  # Parkinson's - patient takes carbidopa-levodopa 25-100 mg p.o. 3 times daily  # Depression/anxiety-resumed amitriptyline 10 mg nightly resumed   #  Neuropathy-gabapentin 100 mg p.o. twice daily  Chart reviewed.   Hospitalization from 07/28/2021 to 08/03/2021: Patient was admitted for community-acquired  pneumonia was treated with 5 days of ceftriaxone and azithromycin IV.  He was also treated for COPD exacerbation with Solu-Medrol and complete course of prednisone while hospitalized.  He had profound weakness and PT recommended rehab however patient was able to walk and insurance company declined rehabilitation.  Peer-to-peer was done by attending provider.  Patient was discharged with home health.  DVT prophylaxis: Enoxaparin 40 mg subcutaneous nightly Code Status: Full code Diet: Regular diet, with feeding Ensure between meals 3 times a day Family Communication: No Disposition Plan: Poor prognosis, anticipate difficult discharge due to social/access to care difficulties/social support Consults called: Palliative Admission status: MedSurg, observation, no telemetry at this time  Past Medical History:  Diagnosis Date   BPH (benign prostatic hyperplasia)    Cancer ([place])    Chronic constipation 06/25/2015   COPD (chronic obstructive pulmonary disease) ([place])    pt denies.   Family history of breast cancer    Family history of prostate cancer    Hypertension    Swelling of lower extremity    bilateral   Wears dentures    full upper and lower - do not fit well.   Past Surgical History:  Procedure Laterality Date   CATARACT EXTRACTION W/PHACO Right 09/30/2015   Procedure: CATARACT EXTRACTION PHACO AND INTRAOCULAR LENS PLACEMENT (IOC);  Surgeon: Leandrew Koyanagi, MD;  Location: [facility];  Service: Ophthalmology;  Laterality: Right;   COLONOSCOPY  2009?   COLONOSCOPY WITH PROPOFOL N/A 01/06/2021   Procedure: COLONOSCOPY WITH PROPOFOL;  Surgeon: Lin Landsman, MD;  Location: [facility] ENDOSCOPY;  Service: Gastroenterology;  Laterality: N/A;   HERNIA REPAIR Right    x2    Social History:  reports that he has been smoking cigarettes. He has a 11.25 pack-year smoking history. He has never used smokeless tobacco. He reports current alcohol use. He reports that he does not use  drugs.  No Known Allergies Family History  Problem Relation Age of Onset   Prostate cancer Brother    Alzheimer's disease Mother    Breast cancer Mother        dx 8 or 64   Stroke Father    Heart attack Daughter    Kidney cancer Neg Hx    Kidney disease Neg Hx    Bladder Cancer Neg Hx    Family history: Family history reviewed and pertinent for prostate cancer in brother.  Prior to Admission medications   Medication Sig Start Date End Date Taking? Authorizing Provider  acetaminophen (TYLENOL) 500 MG tablet Take 500 mg by mouth every 4 (four) hours as needed.    [provider]  amitriptyline (ELAVIL) 10 MG tablet 10 mg at bedtime. 09/11/20   [provider]  amLODipine (NORVASC) 10 MG tablet Take 5 mg by mouth daily. 04/28/21   [provider]  aspirin 81 MG tablet Take 81 mg by mouth daily. PM    [provider]  carbidopa-levodopa (SINEMET IR) 25-100 MG tablet Take 1 tablet by mouth 3 (three) times daily. 06/11/21   [provider]  cetirizine (ZYRTEC) 10 MG tablet Take 10 mg by mouth daily as needed.    [provider]  cyanocobalamin 500 MCG tablet Take 500 mcg by mouth daily. PM    [provider]  feeding supplement (ENSURE ENLIVE / ENSURE PLUS) LIQD Take 237 mLs by mouth 3 (three) times  daily between meals. 08/03/21   Loletha Grayer, MD  finasteride (PROSCAR) 5 MG tablet TAKE 1 TABLET BY MOUTH EVERY DAY 06/11/21   Stoioff, Ronda Fairly, MD  gabapentin (NEURONTIN) 100 MG capsule Take 100 mg by mouth 2 (two) times daily. 06/02/21   [provider]  metoprolol succinate (TOPROL-XL) 50 MG 24 hr tablet Take 50 mg by mouth daily. 08/03/20   [provider]  mirtazapine (REMERON) 7.5 MG tablet Take 7.5 mg by mouth at bedtime. 04/06/21   [provider]  polyethylene glycol (MIRALAX / GLYCOLAX) 17 g packet Take 17 g by mouth daily as needed for severe constipation. 08/03/21   Loletha Grayer, MD  Propylene  Glycol (SYSTANE BALANCE) 0.6 % SOLN INSTILL 3 5 TIMES A DAY IN [facility] AS NEEDED FOR DRYNESS. 06/03/19   [provider]  tamsulosin (FLOMAX) 0.4 MG CAPS capsule TAKE 1 CAPSULE BY MOUTH EVERY DAY 09/30/20   McGowan, Larene Beach A, PA-C  torsemide (DEMADEX) 10 MG tablet Take 1 tablet by mouth daily. 05/07/21 05/07/22  [provider]   Physical Exam: Vitals:   09/05/21 1433 09/05/21 1434 09/05/21 1544  BP: 129/70  125/74  Pulse: (!) 50  (!) 51  Resp: 19  17  Temp: 98.4 F (36.9 C)    TempSrc: Oral    SpO2: 95%  91%  Weight:  58 kg    Constitutional: appears age-appropriate, cachectic, frail, NAD, calm, comfortable Eyes: PERRL, lids and conjunctivae normal ENMT: Mucous membranes are moist. Posterior pharynx clear of any exudate or lesions. Age-appropriate dentition. Hearing appropriate Neck: normal, supple, no masses, no thyromegaly Respiratory: clear to auscultation bilaterally, no wheezing, no crackles. Normal respiratory effort. No accessory muscle use.  Cardiovascular: Regular rate and rhythm, no murmurs / rubs / gallops. No extremity edema. 2+ pedal pulses. No carotid bruits.  Abdomen: Scaphoid abdomen, no tenderness, no masses palpated, no hepatosplenomegaly. Bowel sounds positive.  Musculoskeletal: no clubbing / cyanosis. No joint deformity upper and lower extremities. Good ROM, no contractures, no atrophy. Normal muscle tone.  Skin: no rashes, lesions, ulcers. No induration Neurologic: Sensation intact. Strength 5/5 in all 4.  Psychiatric: Lack insight on his medical disease and its progression.  Alert and oriented x 3.  Flat affect.    EKG: independently reviewed, showing sinus rhythm with rate of 88, QTc 464  Imaging on Admission: I personally reviewed and I agree with radiologist reading as below.  CT HEAD WO CONTRAST (5MM)  Result Date: 09/05/2021 CLINICAL DATA:  Dizziness. EXAM: CT HEAD WITHOUT CONTRAST TECHNIQUE: Contiguous axial images were obtained from the  base of the skull through the vertex without intravenous contrast. COMPARISON:  November 17, 2012. FINDINGS: Brain: No evidence of acute infarction, hemorrhage, hydrocephalus, extra-axial collection or mass lesion/mass effect. Vascular: No hyperdense vessel or unexpected calcification. Skull: Normal. Negative for fracture or focal lesion. Sinuses/Orbits: No acute finding. Other: None. IMPRESSION: No acute intracranial abnormality seen. Electronically Signed   By: Marijo Conception M.D.   On: 09/05/2021 16:15   CT Angio Chest PE W and/or Wo Contrast  Result Date: 09/05/2021 CLINICAL DATA:  Pt to ER via POV with complaints of decreased appetite/ generalized back pain that started 2-3 weeks ago. Reports when he takes a deep breath his whole back hurts. Has been trying to drink ensures but they make him nauseous. Has been taking OTC pain medications with some relief in back pain, denies injury. History of prostate carcinoma. EXAM: CT ANGIOGRAPHY CHEST CT ABDOMEN AND PELVIS WITH  CONTRAST TECHNIQUE: Multidetector CT imaging of the chest was performed using the standard protocol during bolus administration of intravenous contrast. Multiplanar CT image reconstructions and MIPs were obtained to evaluate the vascular anatomy. Multidetector CT imaging of the abdomen and pelvis was performed using the standard protocol during bolus administration of intravenous contrast. CONTRAST:  42m OMNIPAQUE IOHEXOL 350 MG/ML SOLN COMPARISON:  CT a chest, 07/28/2021. CT abdomen and pelvis, 06/10/2021. FINDINGS: CTA CHEST FINDINGS Cardiovascular: Pulmonary arteries are well opacified. Mild limitation due to respiratory motion affecting assessment of the smaller segmental branches to the lower lungs. Allowing for this mild limitation, there is no evidence of a pulmonary embolism. Heart is normal in size. No pericardial effusion. Left coronary artery calcifications. Great vessels are normal in caliber. Aorta only minimally opacified. No  dissection. Mild atherosclerosis. Mediastinum/Nodes: Oval mass at the left neck base, measuring 4.5 x 3.4 x 3.2 cm, consistent with an enlarged lymph node, similar to the prior CT. No mediastinal or hilar masses or enlarged lymph nodes. Mild distension of the esophagus. Trachea is unremarkable. Lungs/Pleura: Linear opacities, both lower lobes, right middle lobe and left upper lobe lingula, consistent with atelectasis. Moderate centrilobular emphysema. No evidence of pneumonia or pulmonary edema. No mass or suspicious nodule. No pleural effusion or pneumothorax. Musculoskeletal: Multiple sclerotic bone lesions consistent with metastatic disease, without significant change from the prior CT. Mild compression fracture of T9, new since the prior exam. No other fractures. Review of the MIP images confirms the above findings. CT ABDOMEN and PELVIS FINDINGS Hepatobiliary: Liver normal in size and overall attenuation. Several small low-attenuation liver masses, stable consistent with cysts. Two dependent gallstones. No evidence of acute cholecystitis. No bile duct dilation. Pancreas: Unremarkable. No pancreatic ductal dilatation or surrounding inflammatory changes. Spleen: Normal in size without focal abnormality. Adrenals/Urinary Tract: Adrenal glands not well-defined due to adjacent mass effect from adenopathy and the cystic kidneys. Both kidneys are notably enlarged with numerous cysts, unchanged compared to the prior CT. No solid renal masses. No hydronephrosis. Bladder only mildly distended.  Prostate indents the bladder base. Stomach/Bowel: Rectal wall thickening, 1 cm in thickness. Colon mildly distended with moderate increase in the colonic stool burden. No convincing colonic wall thickening or inflammation. Small bowel is normal in caliber. No convincing wall thickening or inflammation. Bowel assessment is somewhat limited by lack of contrast in lack of peritoneal fat. Stomach is unremarkable. Vascular/Lymphatic:  Multiple enlarged retroperitoneal lymph nodes which extend to the aortic hiatus. There is a low-attenuation enlarged lymph node at the aortic hiatus measuring 2.7 cm in short axis. Left periaortic node at the level of the left renal vein measures 3 cm in short axis. Nodes posterior to the infrarenal abdominal aorta measures 2.4 cm in short axis. Adenopathy has markedly increased when compared to the prior CT. Aortic atherosclerosis.  Dilated left common iliac artery to 1.8 cm. Reproductive: Heterogeneous prostate with ill-defined margins, indenting the bladder base, similar to the prior CT. Other: No ascites or abdominal wall hernia. Musculoskeletal: Multiple sclerotic metastatic lesions similar to the prior CT. Mild compression fracture of L4 with depression of the upper endplate new/increased from the prior CT. No other fractures. Review of the MIP images confirms the above findings. IMPRESSION: CTA CHEST 1. Mildly limited due to respiratory motion. Allowing for this, no evidence of a pulmonary embolism. 2. No acute findings. 3. Linear areas of atelectasis/scarring in the lungs. 4. Emphysema. 5. Aortic atherosclerosis. 6. Multiple sclerotic bone lesions reflecting metastatic disease from prostate carcinoma, stable from  the recent prior chest CTA. ABDOMEN AND PELVIS CT 1. No convincing acute abnormality. 2. Bulky retroperitoneal adenopathy which has markedly increased when compared to the prior CT consistent with metastatic disease from prostate carcinoma. 3. Moderate increase in the colonic stool burden. No evidence of bowel obstruction or inflammation. 4. Multiple sclerotic bone lesions consistent with metastatic disease, stable from the prior abdomen and pelvis CT. 5. Multiple renal cysts consistent with polycystic kidneys, unchanged from the prior CT. 6. Aortic atherosclerosis. Electronically Signed   By: Lajean Manes M.D.   On: 09/05/2021 16:32   CT ABDOMEN PELVIS W CONTRAST  Result Date:  09/05/2021 CLINICAL DATA:  Pt to ER via POV with complaints of decreased appetite/ generalized back pain that started 2-3 weeks ago. Reports when he takes a deep breath his whole back hurts. Has been trying to drink ensures but they make him nauseous. Has been taking OTC pain medications with some relief in back pain, denies injury. History of prostate carcinoma. EXAM: CT ANGIOGRAPHY CHEST CT ABDOMEN AND PELVIS WITH CONTRAST TECHNIQUE: Multidetector CT imaging of the chest was performed using the standard protocol during bolus administration of intravenous contrast. Multiplanar CT image reconstructions and MIPs were obtained to evaluate the vascular anatomy. Multidetector CT imaging of the abdomen and pelvis was performed using the standard protocol during bolus administration of intravenous contrast. CONTRAST:  100m OMNIPAQUE IOHEXOL 350 MG/ML SOLN COMPARISON:  CT a chest, 07/28/2021. CT abdomen and pelvis, 06/10/2021. FINDINGS: CTA CHEST FINDINGS Cardiovascular: Pulmonary arteries are well opacified. Mild limitation due to respiratory motion affecting assessment of the smaller segmental branches to the lower lungs. Allowing for this mild limitation, there is no evidence of a pulmonary embolism. Heart is normal in size. No pericardial effusion. Left coronary artery calcifications. Great vessels are normal in caliber. Aorta only minimally opacified. No dissection. Mild atherosclerosis. Mediastinum/Nodes: Oval mass at the left neck base, measuring 4.5 x 3.4 x 3.2 cm, consistent with an enlarged lymph node, similar to the prior CT. No mediastinal or hilar masses or enlarged lymph nodes. Mild distension of the esophagus. Trachea is unremarkable. Lungs/Pleura: Linear opacities, both lower lobes, right middle lobe and left upper lobe lingula, consistent with atelectasis. Moderate centrilobular emphysema. No evidence of pneumonia or pulmonary edema. No mass or suspicious nodule. No pleural effusion or pneumothorax.  Musculoskeletal: Multiple sclerotic bone lesions consistent with metastatic disease, without significant change from the prior CT. Mild compression fracture of T9, new since the prior exam. No other fractures. Review of the MIP images confirms the above findings. CT ABDOMEN and PELVIS FINDINGS Hepatobiliary: Liver normal in size and overall attenuation. Several small low-attenuation liver masses, stable consistent with cysts. Two dependent gallstones. No evidence of acute cholecystitis. No bile duct dilation. Pancreas: Unremarkable. No pancreatic ductal dilatation or surrounding inflammatory changes. Spleen: Normal in size without focal abnormality. Adrenals/Urinary Tract: Adrenal glands not well-defined due to adjacent mass effect from adenopathy and the cystic kidneys. Both kidneys are notably enlarged with numerous cysts, unchanged compared to the prior CT. No solid renal masses. No hydronephrosis. Bladder only mildly distended.  Prostate indents the bladder base. Stomach/Bowel: Rectal wall thickening, 1 cm in thickness. Colon mildly distended with moderate increase in the colonic stool burden. No convincing colonic wall thickening or inflammation. Small bowel is normal in caliber. No convincing wall thickening or inflammation. Bowel assessment is somewhat limited by lack of contrast in lack of peritoneal fat. Stomach is unremarkable. Vascular/Lymphatic: Multiple enlarged retroperitoneal lymph nodes which extend to the aortic  hiatus. There is a low-attenuation enlarged lymph node at the aortic hiatus measuring 2.7 cm in short axis. Left periaortic node at the level of the left renal vein measures 3 cm in short axis. Nodes posterior to the infrarenal abdominal aorta measures 2.4 cm in short axis. Adenopathy has markedly increased when compared to the prior CT. Aortic atherosclerosis.  Dilated left common iliac artery to 1.8 cm. Reproductive: Heterogeneous prostate with ill-defined margins, indenting the bladder  base, similar to the prior CT. Other: No ascites or abdominal wall hernia. Musculoskeletal: Multiple sclerotic metastatic lesions similar to the prior CT. Mild compression fracture of L4 with depression of the upper endplate new/increased from the prior CT. No other fractures. Review of the MIP images confirms the above findings. IMPRESSION: CTA CHEST 1. Mildly limited due to respiratory motion. Allowing for this, no evidence of a pulmonary embolism. 2. No acute findings. 3. Linear areas of atelectasis/scarring in the lungs. 4. Emphysema. 5. Aortic atherosclerosis. 6. Multiple sclerotic bone lesions reflecting metastatic disease from prostate carcinoma, stable from the recent prior chest CTA. ABDOMEN AND PELVIS CT 1. No convincing acute abnormality. 2. Bulky retroperitoneal adenopathy which has markedly increased when compared to the prior CT consistent with metastatic disease from prostate carcinoma. 3. Moderate increase in the colonic stool burden. No evidence of bowel obstruction or inflammation. 4. Multiple sclerotic bone lesions consistent with metastatic disease, stable from the prior abdomen and pelvis CT. 5. Multiple renal cysts consistent with polycystic kidneys, unchanged from the prior CT. 6. Aortic atherosclerosis. Electronically Signed   By: Lajean Manes M.D.   On: 09/05/2021 16:32    Labs on Admission: I have personally reviewed following labs  CBC: Recent Labs  Lab 09/05/21 1437  WBC 9.5  HGB 15.6  HCT 48.6  MCV 85.1  PLT 443   Basic Metabolic Panel: Recent Labs  Lab 09/05/21 1437  NA 140  K 4.4  CL 94*  CO2 32  GLUCOSE 146*  BUN 36*  CREATININE 1.36*  CALCIUM 9.9   GFR: Estimated Creatinine Clearance: 32.6 mL/min (A) (by C-G formula based on SCr of 1.36 mg/dL (H)).  Liver Function Tests: Recent Labs  Lab 09/05/21 1437  AST 26  ALT 8  ALKPHOS 124  BILITOT 1.4*  PROT 7.6  ALBUMIN 3.3*   Thyroid Function Tests: Recent Labs    09/05/21 1437  TSH 1.454   FREET4 1.29*   Urine analysis:    Component Value Date/Time   COLORURINE YELLOW 12/30/2016 1525   APPEARANCEUR Clear 09/25/2020 1314   LABSPEC 1.020 12/30/2016 1525   LABSPEC 1.011 11/17/2012 1216   PHURINE 7.0 12/30/2016 1525   GLUCOSEU Negative 09/25/2020 1314   GLUCOSEU Negative 11/17/2012 1216   HGBUR TRACE (A) 12/30/2016 1525   BILIRUBINUR Negative 09/25/2020 1314   BILIRUBINUR Negative 11/17/2012 1216   KETONESUR NEGATIVE 12/30/2016 1525   PROTEINUR Negative 09/25/2020 1314   PROTEINUR TRACE (A) 12/30/2016 1525   NITRITE Negative 09/25/2020 1314   NITRITE NEGATIVE 12/30/2016 1525   LEUKOCYTESUR Negative 09/25/2020 1314   LEUKOCYTESUR Negative 11/17/2012 1216   Dr. Tobie Poet [facility]  If 7PM-7AM, please contact overnight-coverage provider If 7AM-7PM, please contact day coverage provider [email or web address]  09/05/2021, 5:08 PM

## 2021-09-05 NOTE — ED Provider Notes (Signed)
[facility] Emergency Department Provider Note  ____________________________________________   Event Date/Time   First MD Initiated Contact with Patient 09/05/21 1445     (approximate)  I have reviewed the triage vital signs and the nursing notes.   HISTORY  Chief Complaint Nausea and Back Pain    HPI Jacob [name] is a 87 y.o. male with history of COPD, metastatic prostate cancer to the bone admission back in August for pneumonia who comes in today with concerns for nausea and back pain.  Patient reports has had progressive decline over the past few weeks after being discharged from the hospital.  PT had recommended SNF placement but his insurance would not cover it due to him being able to ambulate therefore he went home with a plan to get home health.  Unfortunately they were not able to get home health because there was concern for bedbugs.  His family cleaned everything and make sure there were no bedbugs but for some reason they could still not get any home health in.  He has had progressively declined.  He is able to ambulate slightly but he is extremely weak and unable to put on his close not able to feed himself.  He reports severe back pain across his entire lower back, worse with taking a deep breath.  He reports every time he tries to eat he vomits and has a lot of nausea.  His symptoms are severe, constant, progressively worsening over the past few days, nothing makes them better or worse.  On review of records patient had when he was admitted had bilateral pneumonia on CT scan got 5 days of antibiotics.  Patient was sent home with home health          Past Medical History:  Diagnosis Date   BPH (benign prostatic hyperplasia)    Cancer ([place])    Chronic constipation 06/25/2015   COPD (chronic obstructive pulmonary disease) ([place])    pt denies.   Family history of breast cancer    Family history of prostate cancer    Hypertension    Swelling of  lower extremity    bilateral   Wears dentures    full upper and lower - do not fit well.    Patient Active Problem List   Diagnosis Date Noted   Palliative care encounter    Bedbug bite    Protein-calorie malnutrition, severe 07/29/2021   Acute respiratory failure with hypoxia (HCC)    Lobar pneumonia ([place])    COPD with acute exacerbation ([place])    Weakness    CAP (community acquired pneumonia) 07/28/2021   Parkinson disease ([place]) 07/28/2021   Depression 07/28/2021   Genetic testing 04/12/2021   Family history of prostate cancer    Family history of breast cancer    Goals of care, counseling/discussion 01/18/2021   Prostate cancer metastatic to bone ([place]) 01/14/2021   Vitiligo 10/04/2019   Vitamin D deficiency 12/28/2018   Right inguinal hernia 06/21/2018   Chronic kidney insufficiency, stage 2 (mild) 02/02/2018   Chronic bronchitis, simple ([place]) 12/28/2016   High risk medication use 12/24/2015   Cigarette smoker 12/24/2015   Essential hypertension 06/24/2014   Benign prostatic hyperplasia 06/24/2014   Back pain 06/24/2014   B12 deficiency 06/24/2014    Past Surgical History:  Procedure Laterality Date   CATARACT EXTRACTION W/PHACO Right 09/30/2015   Procedure: CATARACT EXTRACTION PHACO AND INTRAOCULAR LENS PLACEMENT ([place]);  Surgeon: Leandrew Koyanagi, MD;  Location: [place];  Service: Ophthalmology;  Laterality: Right;   COLONOSCOPY  2009?   COLONOSCOPY WITH PROPOFOL N/A 01/06/2021   Procedure: COLONOSCOPY WITH PROPOFOL;  Surgeon: Lin Landsman, MD;  Location[facility] ENDOSCOPY;  Service: Gastroenterology;  Laterality: N/A;   HERNIA REPAIR Right    x2     Prior to Admission medications   Medication Sig Start Date End Date Taking? Authorizing Provider  acetaminophen (TYLENOL) 500 MG tablet Take 500 mg by mouth every 4 (four) hours as needed.    [provider]  amitriptyline (ELAVIL) 10 MG tablet 10 mg at bedtime. 09/11/20   [provider]  amLODipine (NORVASC) 10 MG tablet Take 5 mg by mouth daily. 04/28/21   [provider]  aspirin 81 MG tablet Take 81 mg by mouth daily. PM    [provider]  carbidopa-levodopa (SINEMET IR) 25-100 MG tablet Take 1 tablet by mouth 3 (three) times daily. 06/11/21   [provider]  cetirizine (ZYRTEC) 10 MG tablet Take 10 mg by mouth daily as needed.    [provider]  cyanocobalamin 500 MCG tablet Take 500 mcg by mouth daily. PM    [provider]  feeding supplement (ENSURE ENLIVE / ENSURE PLUS) LIQD Take 237 mLs by mouth 3 (three) times daily between meals. 08/03/21   Loletha Grayer, MD  finasteride (PROSCAR) 5 MG tablet TAKE 1 TABLET BY MOUTH EVERY DAY 06/11/21   Stoioff, Ronda Fairly, MD  gabapentin (NEURONTIN) 100 MG capsule Take 100 mg by mouth 2 (two) times daily. 06/02/21   [provider]  metoprolol succinate (TOPROL-XL) 50 MG 24 hr tablet Take 50 mg by mouth daily. 08/03/20   [provider]  mirtazapine (REMERON) 7.5 MG tablet Take 7.5 mg by mouth at bedtime. 04/06/21   [provider]  polyethylene glycol (MIRALAX / GLYCOLAX) 17 g packet Take 17 g by mouth daily as needed for severe constipation. 08/03/21   Loletha Grayer, MD  Propylene Glycol (SYSTANE BALANCE) 0.6 % SOLN INSTILL 3 5 TIMES A DAY IN [facility] AS NEEDED FOR DRYNESS. 06/03/19   [provider]  tamsulosin (FLOMAX) 0.4 MG CAPS capsule TAKE 1 CAPSULE BY MOUTH EVERY DAY 09/30/20   McGowan, Larene Beach A, PA-C  torsemide (DEMADEX) 10 MG tablet Take 1 tablet by mouth daily. 05/07/21 05/07/22  [provider]    Allergies Patient has no known allergies.  Family History  Problem Relation Age of Onset   Prostate cancer Brother    Alzheimer's disease Mother    Breast cancer Mother        dx 94 or 14   Stroke Father    Heart attack Daughter    Kidney cancer Neg Hx    Kidney disease Neg Hx    Bladder Cancer Neg Hx     Social  History Social History   Tobacco Use   Smoking status: Every Day    Packs/day: 0.25    Years: 45.00    Pack years: 11.25    Types: Cigarettes   Smokeless tobacco: Never  Vaping Use   Vaping Use: Never used  Substance Use Topics   Alcohol use: Yes    Comment: drink beer maybe 2 - 3 weeks   Drug use: No      Review of Systems Constitutional: No fever/chills, weakness Eyes: No visual changes. ENT: No sore throat. Cardiovascular: Denies chest pain. Respiratory: Shortness of breath Gastrointestinal: No abdominal pain.  Positive nausea and vomiting Genitourinary: Negative for dysuria. Musculoskeletal:  Positive back pain  skin: Negative for rash. Neurological: Negative for headaches, focal weakness or numbness. All other ROS negative ____________________________________________   PHYSICAL EXAM:  VITAL SIGNS: ED Triage Vitals  Enc Vitals Group     BP 09/05/21 1433 129/70     Pulse Rate 09/05/21 1433 (!) 50     Resp 09/05/21 1433 19     Temp 09/05/21 1433 98.4 F (36.9 C)     Temp Source 09/05/21 1433 Oral     SpO2 09/05/21 1433 95 %     Weight 09/05/21 1434 127 lb 13.9 oz (58 kg)     Height --      Head Circumference --      Peak Flow --      Pain Score 09/05/21 1433 9     Pain Loc --      Pain Edu? --      Excl. in [place]? --     Constitutional: Alert and oriented.  Elderly male, cachectic Eyes: Conjunctivae are normal. EOMI. Head: Atraumatic. Nose: No congestion/rhinnorhea. Mouth/Throat: Mucous membranes are moist.   Neck: No stridor. Trachea Midline. FROM Cardiovascular: Bradycardic, regular rhythm. Grossly normal heart sounds.  Good peripheral circulation. Respiratory: Normal respiratory effort.  No retractions. Lungs CTAB. Gastrointestinal: Soft and nontender. No distention. No abdominal bruits.  Musculoskeletal: No lower extremity tenderness nor edema.  No joint effusions. Neurologic:  Normal speech and language. No gross focal neurologic deficits are  appreciated.  Unable to straight leg lift either of his legs but when they are bent he is able to move them.  Equal sensation in his legs and his arms. Skin:  Skin is warm, dry and intact. No rash noted. Psychiatric: Mood and affect are normal. Speech and behavior are normal. GU: Deferred  Back: Difficult to appreciate where his back pain is at.  Patient continues to state hurts all over. ____________________________________________   LABS (all labs ordered are listed, but only abnormal results are displayed)  Labs Reviewed  COMPREHENSIVE METABOLIC PANEL - Abnormal; Notable for the following components:      Result Value   Chloride 94 (*)    Glucose, Bld 146 (*)    BUN 36 (*)    Creatinine, Ser 1.36 (*)    Albumin 3.3 (*)    Total Bilirubin 1.4 (*)    GFR, Estimated 51 (*)    All other components within normal limits  RESP PANEL BY RT-PCR (FLU A&B, COVID) ARPGX2  CBC  URINALYSIS, COMPLETE (UACMP) WITH MICROSCOPIC  TROPONIN I (HIGH SENSITIVITY)   ____________________________________________   ED ECG REPORT I, Vanessa Elliston, the attending physician, personally viewed and interpreted this ECG.  Normal sinus rate of 88, no ST elevations, no T wave inversions, occasional PVCs ____________________________________________  RADIOLOGY   Official radiology report(s): CT HEAD WO CONTRAST (5MM)  Result Date: 09/05/2021 CLINICAL DATA:  Dizziness. EXAM: CT HEAD WITHOUT CONTRAST TECHNIQUE: Contiguous axial images were obtained from the base of the skull through the vertex without intravenous contrast. COMPARISON:  November 17, 2012. FINDINGS: Brain: No evidence of acute infarction, hemorrhage, hydrocephalus, extra-axial collection or mass lesion/mass effect. Vascular: No hyperdense vessel or unexpected calcification. Skull: Normal. Negative for fracture or focal lesion. Sinuses/Orbits: No acute finding. Other: None. IMPRESSION: No acute intracranial abnormality seen. Electronically Signed    By: Marijo Conception M.D.   On: 09/05/2021 16:15   CT Angio Chest PE W and/or Wo Contrast  Result Date: 09/05/2021 CLINICAL DATA:  Pt to ER via POV  with complaints of decreased appetite/ generalized back pain that started 2-3 weeks ago. Reports when he takes a deep breath his whole back hurts. Has been trying to drink ensures but they make him nauseous. Has been taking OTC pain medications with some relief in back pain, denies injury. History of prostate carcinoma. EXAM: CT ANGIOGRAPHY CHEST CT ABDOMEN AND PELVIS WITH CONTRAST TECHNIQUE: Multidetector CT imaging of the chest was performed using the standard protocol during bolus administration of intravenous contrast. Multiplanar CT image reconstructions and MIPs were obtained to evaluate the vascular anatomy. Multidetector CT imaging of the abdomen and pelvis was performed using the standard protocol during bolus administration of intravenous contrast. CONTRAST:  54mL OMNIPAQUE IOHEXOL 350 MG/ML SOLN COMPARISON:  CT a chest, 07/28/2021. CT abdomen and pelvis, 06/10/2021. FINDINGS: CTA CHEST FINDINGS Cardiovascular: Pulmonary arteries are well opacified. Mild limitation due to respiratory motion affecting assessment of the smaller segmental branches to the lower lungs. Allowing for this mild limitation, there is no evidence of a pulmonary embolism. Heart is normal in size. No pericardial effusion. Left coronary artery calcifications. Great vessels are normal in caliber. Aorta only minimally opacified. No dissection. Mild atherosclerosis. Mediastinum/Nodes: Oval mass at the left neck base, measuring 4.5 x 3.4 x 3.2 cm, consistent with an enlarged lymph node, similar to the prior CT. No mediastinal or hilar masses or enlarged lymph nodes. Mild distension of the esophagus. Trachea is unremarkable. Lungs/Pleura: Linear opacities, both lower lobes, right middle lobe and left upper lobe lingula, consistent with atelectasis. Moderate centrilobular emphysema. No evidence  of pneumonia or pulmonary edema. No mass or suspicious nodule. No pleural effusion or pneumothorax. Musculoskeletal: Multiple sclerotic bone lesions consistent with metastatic disease, without significant change from the prior CT. Mild compression fracture of T9, new since the prior exam. No other fractures. Review of the MIP images confirms the above findings. CT ABDOMEN and PELVIS FINDINGS Hepatobiliary: Liver normal in size and overall attenuation. Several small low-attenuation liver masses, stable consistent with cysts. Two dependent gallstones. No evidence of acute cholecystitis. No bile duct dilation. Pancreas: Unremarkable. No pancreatic ductal dilatation or surrounding inflammatory changes. Spleen: Normal in size without focal abnormality. Adrenals/Urinary Tract: Adrenal glands not well-defined due to adjacent mass effect from adenopathy and the cystic kidneys. Both kidneys are notably enlarged with numerous cysts, unchanged compared to the prior CT. No solid renal masses. No hydronephrosis. Bladder only mildly distended.  Prostate indents the bladder base. Stomach/Bowel: Rectal wall thickening, 1 cm in thickness. Colon mildly distended with moderate increase in the colonic stool burden. No convincing colonic wall thickening or inflammation. Small bowel is normal in caliber. No convincing wall thickening or inflammation. Bowel assessment is somewhat limited by lack of contrast in lack of peritoneal fat. Stomach is unremarkable. Vascular/Lymphatic: Multiple enlarged retroperitoneal lymph nodes which extend to the aortic hiatus. There is a low-attenuation enlarged lymph node at the aortic hiatus measuring 2.7 cm in short axis. Left periaortic node at the level of the left renal vein measures 3 cm in short axis. Nodes posterior to the infrarenal abdominal aorta measures 2.4 cm in short axis. Adenopathy has markedly increased when compared to the prior CT. Aortic atherosclerosis.  Dilated left common iliac  artery to 1.8 cm. Reproductive: Heterogeneous prostate with ill-defined margins, indenting the bladder base, similar to the prior CT. Other: No ascites or abdominal wall hernia. Musculoskeletal: Multiple sclerotic metastatic lesions similar to the prior CT. Mild compression fracture of L4 with depression of the upper endplate new/increased from the prior  CT. No other fractures. Review of the MIP images confirms the above findings. IMPRESSION: CTA CHEST 1. Mildly limited due to respiratory motion. Allowing for this, no evidence of a pulmonary embolism. 2. No acute findings. 3. Linear areas of atelectasis/scarring in the lungs. 4. Emphysema. 5. Aortic atherosclerosis. 6. Multiple sclerotic bone lesions reflecting metastatic disease from prostate carcinoma, stable from the recent prior chest CTA. ABDOMEN AND PELVIS CT 1. No convincing acute abnormality. 2. Bulky retroperitoneal adenopathy which has markedly increased when compared to the prior CT consistent with metastatic disease from prostate carcinoma. 3. Moderate increase in the colonic stool burden. No evidence of bowel obstruction or inflammation. 4. Multiple sclerotic bone lesions consistent with metastatic disease, stable from the prior abdomen and pelvis CT. 5. Multiple renal cysts consistent with polycystic kidneys, unchanged from the prior CT. 6. Aortic atherosclerosis. Electronically Signed   By: Lajean Manes M.D.   On: 09/05/2021 16:32   CT ABDOMEN PELVIS W CONTRAST  Result Date: 09/05/2021 CLINICAL DATA:  Pt to ER via POV with complaints of decreased appetite/ generalized back pain that started 2-3 weeks ago. Reports when he takes a deep breath his whole back hurts. Has been trying to drink ensures but they make him nauseous. Has been taking OTC pain medications with some relief in back pain, denies injury. History of prostate carcinoma. EXAM: CT ANGIOGRAPHY CHEST CT ABDOMEN AND PELVIS WITH CONTRAST TECHNIQUE: Multidetector CT imaging of the chest  was performed using the standard protocol during bolus administration of intravenous contrast. Multiplanar CT image reconstructions and MIPs were obtained to evaluate the vascular anatomy. Multidetector CT imaging of the abdomen and pelvis was performed using the standard protocol during bolus administration of intravenous contrast. CONTRAST:  22mL OMNIPAQUE IOHEXOL 350 MG/ML SOLN COMPARISON:  CT a chest, 07/28/2021. CT abdomen and pelvis, 06/10/2021. FINDINGS: CTA CHEST FINDINGS Cardiovascular: Pulmonary arteries are well opacified. Mild limitation due to respiratory motion affecting assessment of the smaller segmental branches to the lower lungs. Allowing for this mild limitation, there is no evidence of a pulmonary embolism. Heart is normal in size. No pericardial effusion. Left coronary artery calcifications. Great vessels are normal in caliber. Aorta only minimally opacified. No dissection. Mild atherosclerosis. Mediastinum/Nodes: Oval mass at the left neck base, measuring 4.5 x 3.4 x 3.2 cm, consistent with an enlarged lymph node, similar to the prior CT. No mediastinal or hilar masses or enlarged lymph nodes. Mild distension of the esophagus. Trachea is unremarkable. Lungs/Pleura: Linear opacities, both lower lobes, right middle lobe and left upper lobe lingula, consistent with atelectasis. Moderate centrilobular emphysema. No evidence of pneumonia or pulmonary edema. No mass or suspicious nodule. No pleural effusion or pneumothorax. Musculoskeletal: Multiple sclerotic bone lesions consistent with metastatic disease, without significant change from the prior CT. Mild compression fracture of T9, new since the prior exam. No other fractures. Review of the MIP images confirms the above findings. CT ABDOMEN and PELVIS FINDINGS Hepatobiliary: Liver normal in size and overall attenuation. Several small low-attenuation liver masses, stable consistent with cysts. Two dependent gallstones. No evidence of acute  cholecystitis. No bile duct dilation. Pancreas: Unremarkable. No pancreatic ductal dilatation or surrounding inflammatory changes. Spleen: Normal in size without focal abnormality. Adrenals/Urinary Tract: Adrenal glands not well-defined due to adjacent mass effect from adenopathy and the cystic kidneys. Both kidneys are notably enlarged with numerous cysts, unchanged compared to the prior CT. No solid renal masses. No hydronephrosis. Bladder only mildly distended.  Prostate indents the bladder base. Stomach/Bowel: Rectal wall thickening,  1 cm in thickness. Colon mildly distended with moderate increase in the colonic stool burden. No convincing colonic wall thickening or inflammation. Small bowel is normal in caliber. No convincing wall thickening or inflammation. Bowel assessment is somewhat limited by lack of contrast in lack of peritoneal fat. Stomach is unremarkable. Vascular/Lymphatic: Multiple enlarged retroperitoneal lymph nodes which extend to the aortic hiatus. There is a low-attenuation enlarged lymph node at the aortic hiatus measuring 2.7 cm in short axis. Left periaortic node at the level of the left renal vein measures 3 cm in short axis. Nodes posterior to the infrarenal abdominal aorta measures 2.4 cm in short axis. Adenopathy has markedly increased when compared to the prior CT. Aortic atherosclerosis.  Dilated left common iliac artery to 1.8 cm. Reproductive: Heterogeneous prostate with ill-defined margins, indenting the bladder base, similar to the prior CT. Other: No ascites or abdominal wall hernia. Musculoskeletal: Multiple sclerotic metastatic lesions similar to the prior CT. Mild compression fracture of L4 with depression of the upper endplate new/increased from the prior CT. No other fractures. Review of the MIP images confirms the above findings. IMPRESSION: CTA CHEST 1. Mildly limited due to respiratory motion. Allowing for this, no evidence of a pulmonary embolism. 2. No acute findings. 3.  Linear areas of atelectasis/scarring in the lungs. 4. Emphysema. 5. Aortic atherosclerosis. 6. Multiple sclerotic bone lesions reflecting metastatic disease from prostate carcinoma, stable from the recent prior chest CTA. ABDOMEN AND PELVIS CT 1. No convincing acute abnormality. 2. Bulky retroperitoneal adenopathy which has markedly increased when compared to the prior CT consistent with metastatic disease from prostate carcinoma. 3. Moderate increase in the colonic stool burden. No evidence of bowel obstruction or inflammation. 4. Multiple sclerotic bone lesions consistent with metastatic disease, stable from the prior abdomen and pelvis CT. 5. Multiple renal cysts consistent with polycystic kidneys, unchanged from the prior CT. 6. Aortic atherosclerosis. Electronically Signed   By: Lajean Manes M.D.   On: 09/05/2021 16:32    ____________________________________________   PROCEDURES  Procedure(s) performed (including Critical Care):  .1-3 Lead EKG Interpretation Performed by: Vanessa Menan, MD Authorized by: Vanessa [place], MD     Interpretation: abnormal     ECG rate:  80s   ECG rate assessment: normal     Rhythm: sinus rhythm     Ectopy: bigeminy and PVCs     Conduction: normal     ____________________________________________   INITIAL IMPRESSION / ASSESSMENT AND PLAN / ED COURSE  Jacob [name] was evaluated in Emergency Department on 09/05/2021 for the symptoms described in the history of present illness. He was evaluated in the context of the global COVID-19 pandemic, which necessitated consideration that the patient might be at risk for infection with the SARS-CoV-2 virus that causes COVID-19. Institutional protocols and algorithms that pertain to the evaluation of patients at risk for COVID-19 are in a state of rapid change based on information released by regulatory bodies including the CDC and federal and state organizations. These policies and algorithms were followed during the  patient's care in the ED.     Exam with increasing weakness in the setting of known prostate cancer with mets to the bones.  Labs ordered evaluate for any electrolyte abnormalities, AKI, ACS.  Patient was kept on the cardiac monitor given frequent PVCs and occasionally has a ventricular bigeminy.  Given increasing weakness and not able to take care of himself I am concerned that patient is having failure to thrive.  Will get CT  head to make sure no large brain mass that is causing him his increasing weakness that could be amenable to radiation.  We will also do a CT PE to make sure no evidence of pulmonary embolism given the shortness of breath and pain with breathing as well as a CT abdomen to make sure no evidence of obstruction given his inability to tolerate p.o.  Patient be given 1 L of fluids and some IV Zofran and pain meds.  Labs show new AKI.  Patient CT scans do not show any evidence of PE but does show some evidence of constipation worsening metastatic prostate.  I have low suspicion for cord compression but given his concern for lower back pain we will get MRIs T and L-spine just to make sure no evidence of cord compression.  Will discuss with hospital team for admission due to failure to thrive, increasing weakness secondary to his known metastatic cancer, aki.      ____________________________________________   FINAL CLINICAL IMPRESSION(S) / ED DIAGNOSES   Final diagnoses:  AKI (acute kidney injury) ([place])  Failure to thrive in adult  Prostate cancer metastatic to bone [facility] [place])      MEDICATIONS GIVEN DURING THIS VISIT:  Medications  sodium chloride 0.9 % bolus 1,000 mL (1,000 mLs Intravenous New Bag/Given 09/05/21 1514)  ondansetron (ZOFRAN) injection 4 mg (4 mg Intravenous Given 09/05/21 1514)  fentaNYL (SUBLIMAZE) injection 25 mcg (25 mcg Intravenous Given 09/05/21 1515)  iohexol (OMNIPAQUE) 350 MG/ML injection 75 mL (75 mLs Intravenous Contrast Given 09/05/21 1550)     ED  Discharge Orders     None        Note:  This document was prepared using Dragon voice recognition software and may include unintentional dictation errors.    Vanessa Noonan, MD 09/05/21 [phone]

## 2021-09-06 DIAGNOSIS — E538 Deficiency of other specified B group vitamins: Secondary | ICD-10-CM | POA: Diagnosis not present

## 2021-09-06 DIAGNOSIS — I10 Essential (primary) hypertension: Secondary | ICD-10-CM

## 2021-09-06 DIAGNOSIS — R627 Adult failure to thrive: Secondary | ICD-10-CM

## 2021-09-06 DIAGNOSIS — F17210 Nicotine dependence, cigarettes, uncomplicated: Secondary | ICD-10-CM

## 2021-09-06 DIAGNOSIS — C7951 Secondary malignant neoplasm of bone: Secondary | ICD-10-CM | POA: Diagnosis not present

## 2021-09-06 DIAGNOSIS — J441 Chronic obstructive pulmonary disease with (acute) exacerbation: Secondary | ICD-10-CM

## 2021-09-06 DIAGNOSIS — R634 Abnormal weight loss: Secondary | ICD-10-CM

## 2021-09-06 DIAGNOSIS — C61 Malignant neoplasm of prostate: Secondary | ICD-10-CM | POA: Diagnosis not present

## 2021-09-06 DIAGNOSIS — M545 Low back pain, unspecified: Secondary | ICD-10-CM | POA: Diagnosis not present

## 2021-09-06 DIAGNOSIS — N179 Acute kidney failure, unspecified: Secondary | ICD-10-CM | POA: Diagnosis not present

## 2021-09-06 LAB — BASIC METABOLIC PANEL
Anion gap: 8 (ref 5–15)
BUN: 29 mg/dL — ABNORMAL HIGH (ref 8–23)
CO2: 30 mmol/L (ref 22–32)
Calcium: 8.8 mg/dL — ABNORMAL LOW (ref 8.9–10.3)
Chloride: 101 mmol/L (ref 98–111)
Creatinine, Ser: 0.99 mg/dL (ref 0.61–1.24)
GFR, Estimated: >60 mL/min (ref >60)
Glucose, Bld: 87 mg/dL (ref 70–99)
Potassium: 3.8 mmol/L (ref 3.5–5.1)
Sodium: 139 mmol/L (ref 135–145)

## 2021-09-06 LAB — CBC
HCT: 43.8 % (ref 39.0–52.0)
Hemoglobin: 13.9 g/dL (ref 13.0–17.0)
MCH: 27.1 pg (ref 26.0–34.0)
MCHC: 31.7 g/dL (ref 30.0–36.0)
MCV: 85.4 fL (ref 80.0–100.0)
Platelets: 368 10*3/uL (ref 150–400)
RBC: 5.13 MIL/uL (ref 4.22–5.81)
RDW: 14.5 % (ref 11.5–15.5)
WBC: 6.9 10*3/uL (ref 4.0–10.5)
nRBC: 0 % (ref 0.0–0.2)

## 2021-09-06 LAB — VITAMIN B12: Vitamin B-12: 3552 pg/mL — ABNORMAL HIGH (ref 180–914)

## 2021-09-06 MED ORDER — ADULT MULTIVITAMIN W/MINERALS CH
1.0000 | ORAL_TABLET | Freq: Every day | ORAL | Status: DC
  Administered 2021-09-06 – 2021-09-10 (×5): 1 via ORAL
  Filled 2021-09-06 (×5): qty 1

## 2021-09-06 MED ORDER — LACTATED RINGERS IV SOLN: INTRAVENOUS | Status: AC

## 2021-09-06 MED ORDER — OXYCODONE-ACETAMINOPHEN 5-325 MG PO TABS
1.0000 | ORAL_TABLET | Freq: Four times a day (QID) | ORAL | Status: DC | PRN
  Administered 2021-09-10: 1 via ORAL
  Filled 2021-09-06: qty 1

## 2021-09-06 MED ORDER — POLYETHYLENE GLYCOL 3350 17 G PO PACK
17.0000 g | PACK | Freq: Two times a day (BID) | ORAL | Status: DC
  Filled 2021-09-06 (×3): qty 1

## 2021-09-06 NOTE — Progress Notes (Signed)
Initial Nutrition Assessment  DOCUMENTATION CODES:  Underweight  INTERVENTION:  Continue regular diet.  Continue Ensure TID.  Add Magic cup TID with meals, each supplement provides 290 kcal and 9 grams of protein.  Add MVI with minerals daily.  Encourage PO and supplement intake.  NUTRITION DIAGNOSIS:  Increased nutrient needs related to cancer and cancer related treatments as evidenced by estimated needs.  GOAL:  Patient will meet greater than or equal to 90% of their needs  MONITOR:  PO intake, Supplement acceptance, Labs, Weight trends, I & O's  REASON FOR ASSESSMENT:  Consult Assessment of nutrition requirement/status, Poor PO  ASSESSMENT:  87 yo male with a PMH of metastatic prostate cancer, HTN, HLD, neuropathy, Parkinson's, and BPH who is admitted with failure to thrive.  RD working remotely. Attempted to call patient's room phone. Pt did not answer.  RD familiar with this patient, as RD saw him during his last admission. During previous admission, pt stated he was working on gain weight at home.  Give that patient's weight has continued to drop, RD suspects patient has not been eating well. Also noted that pt does not have edema this admission.  RD suspects patient is still severely malnourished, however, RD cannot diagnose definitively at this time.  Per Epic, pt has lost 2 lbs (1.7%) in the last ~3 weeks, which is not necessarily significant for the time frame.  Recommend continuing Ensure TID, and adding Magic Cup TID, as well as MVI with minerals daily.  Supplements: Ensure TID  Medications: reviewed; Sinemet TID, Remeron, miralax BID, Vitamin B12, LR @ 75 ml/hr  Labs: reviewed  NUTRITION - FOCUSED PHYSICAL EXAM: Unable to perform - defer to in-person assessment  Diet Order:   Diet Order             Diet regular Room service appropriate? Yes; Fluid consistency: Thin  Diet effective now                  EDUCATION NEEDS:  No education needs  have been identified at this time  Skin:  Skin Assessment: Reviewed RN Assessment  Last BM:  09/06/21 - Type 7, large  Height:  Ht Readings from Last 1 Encounters:  09/05/21 6' (1.829 m)   Weight:  Wt Readings from Last 1 Encounters:  09/05/21 57.9 kg   BMI:  Body mass index is 17.31 kg/m.  Estimated Nutritional Needs:  Kcal:  2100-2300 Protein:  85-100 grams Fluid:  >2.1 L  Derrel Nip, RD, LDN (she/her/hers) Registered Dietitian I After-Hours/Weekend Pager # in [place]

## 2021-09-06 NOTE — Progress Notes (Addendum)
Did have some PROGRESS NOTE  Jacob [name]:[id number] DOB: October 07, 1934 DOA: 09/05/2021 PCP: [name], MD (Inactive)   LOS: 0 days   Brief narrative: Jacob [name] is a 87 y.o. male with medical history significant for metastatic prostate cancer, hypertension, hyperlipidemia, neuropathy, Parkinson's, BPH, presented to the hospital with complaints of generalized weakness and back pain and failure to thrive.  In the ED, patient was noted to be slightly bradycardic.  Labs were significant for creatinine of 1.36.  Patient was given fentanyl IV fluids and patient was admitted to hospital for further evaluation treatment.  Assessment/Plan:  Principal Problem:   Failure to thrive in adult Active Problems:   Essential hypertension   Benign prostatic hyperplasia   Back pain   B12 deficiency   Prostate cancer metastatic to bone [facility])   Family history of prostate cancer   Parkinson disease ([place])   Depression   COPD with acute exacerbation ([place])   Weakness   Protein-calorie malnutrition, severe   AKI (acute kidney injury) ([place])   Failure to thrive Secondary to metastatic metastatic prostate cancer.  On Ensure supplements.  Palliative care has been consulted for goals of care.  States that he has lost around 15 to 20 pounds.  Patient used to be on Lupron in the past and was supposed to follow-up with Dr. Janese Banks as outpatient.  Patient was recently admitted to hospital and was discharged home.  Communicated with Dr. Janese Banks oncology for consultation.  Back pain likely metastatic from prostate cancer.  On IV morphine.  We will continue to monitor.  Still complains of back pain.  MRI of the thoracic and lumbar spine showed T9 superior endplate vertebral compression fractures T10 endplate deformity L4 superior endplate compression fracture, diffuse osseous metastatic disease in the lumbar and lower thoracic spine and sacrum and pelvis.  Add oral narcotics as well.  Spoke with oncology for  consultation.  Acute kidney injury with volume depletion-  Received IV fluid bolus.  We will monitor BMP.  Continue IV fluids for 1 more day.   Elevated troponin Mild elevated troponin but flat.  Likely secondary to acute kidney injury.  No chest pain or shortness of breath.   Essential hypertension Amlodipine and metoprolol on hold.    Parkinson's disease-  Continue carbidopa-levodopa    Depression/anxiety-continue amitriptyline.  Peripheral neuropathy-continue Neurontin.  Severe protein calorie malnutrition.  Present on admission.  Consult dietitian.  On Ensure.  We will continue.  Debility, deconditioning back pain.  Patient from home.  He lives by himself.  Patient's son states that he would like his father to be at rehabilitation place.  Ethics/goals of care.  Spoke with the patient/son about metastatic prostate cancer.  He wishes him to go to skilled nursing facility.  Personally patient would benefit from palliative care hospice level of care.  He lives by himself in poor housing conditions.  Board at home.  DVT prophylaxis: enoxaparin (LOVENOX) injection 40 mg Start: 09/05/21 2200 Place TED hose Start: 09/05/21 1704   Code Status: Full code  Family Communication:  Spoke with patient's son on the phone and updated him about the clinical condition of the patient.  He would like to see if the patient would by for skilled nursing facility.  We will consult TOC.  Status is: Observation  The patient should be moved to inpatient due to ambulatory dysfunction and back pain failure to thrive and possible need for palliative care/placement, unsafe discharge.  Dispo: The patient is from: Home  Anticipated d/c is to: SNF/ hospice              Patient currently is not medically stable to d/c.   Difficult to place patient No   Consultants: Palliative care  Procedures: None  Anti-infectives:  None  Anti-infectives (From admission, onward)    None       Subjective: Today, patient was seen and examined at bedside.  Patient still complains of back pain.  Denies any shortness of breath, cough, fever, chills.  Gibberish talk but able to comprehend.  Complains of constipation.  Objective: Vitals:   09/06/21 0800 09/06/21 1158  BP: 115/78 112/65  Pulse: 60 (!) 45  Resp: 18 20  Temp:  97.7 F (36.5 C)  SpO2: 90% 90%    Intake/Output Summary (Last 24 hours) at 09/06/2021 1222 Last data filed at 09/06/2021 1219 Gross per 24 hour  Intake 2385.49 ml  Output 375 ml  Net 2010.49 ml   Filed Weights   09/05/21 1434 09/05/21 1944  Weight: 58 kg 57.9 kg   Body mass index is 17.31 kg/m.   Physical Exam:  GENERAL: Patient is alert awake and communicative,. Not in obvious distress.  Communicative.  Cachectic, male frail appearing HENT: No scleral pallor or icterus. Pupils equally reactive to light. Oral mucosa is dry NECK: is supple, no gross swelling noted. CHEST: Clear to auscultation. No crackles or wheezes.  Diminished breath sounds bilaterally.  Lower back tenderness on palpation. CVS: S1 and S2 heard, no murmur. Regular rate and rhythm.  ABDOMEN: Soft, non-tender, bowel sounds are present. EXTREMITIES: No edema.  Muscle atrophy. CNS: Cranial nerves are intact.  Moves all extremities.  Lack of insight.  Oriented to place. SKIN: warm and dry without rashes.  Skin turgor diminished.  Data Review: I have personally reviewed the following laboratory data and studies,  CBC: Recent Labs  Lab 09/05/21 1437 09/06/21 0503  WBC 9.5 6.9  HGB 15.6 13.9  HCT 48.6 43.8  MCV 85.1 85.4  PLT 393 573   Basic Metabolic Panel: Recent Labs  Lab 09/05/21 1437 09/06/21 0503  NA 140 139  K 4.4 3.8  CL 94* 101  CO2 32 30  GLUCOSE 146* 87  BUN 36* 29*  CREATININE 1.36* 0.99  CALCIUM 9.9 8.8*   Liver Function Tests: Recent Labs  Lab 09/05/21 1437  AST 26  ALT 8  ALKPHOS 124  BILITOT 1.4*  PROT 7.6  ALBUMIN 3.3*   No results for  input(s): LIPASE, AMYLASE in the last 168 hours. No results for input(s): AMMONIA in the last 168 hours. Cardiac Enzymes: No results for input(s): CKTOTAL, CKMB, CKMBINDEX, TROPONINI in the last 168 hours. BNP (last 3 results) Recent Labs    07/28/21 1221  BNP 123.8*    ProBNP (last 3 results) No results for input(s): PROBNP in the last 8760 hours.  CBG: No results for input(s): GLUCAP in the last 168 hours. Recent Results (from the past 240 hour(s))  Resp Panel by RT-PCR (Flu A&B, Covid) Nasopharyngeal Swab     Status: None   Collection Time: 09/05/21  3:07 PM   Specimen: Nasopharyngeal Swab; Nasopharyngeal(NP) swabs in vial transport medium  Result Value Ref Range Status   SARS Coronavirus 2 by RT PCR NEGATIVE NEGATIVE Final    Comment: (NOTE) SARS-CoV-2 target nucleic acids are NOT DETECTED.  The SARS-CoV-2 RNA is generally detectable in upper respiratory specimens during the acute phase of infection. The lowest concentration of SARS-CoV-2 viral copies this assay can  detect is 138 copies/mL. A negative result does not preclude SARS-Cov-2 infection and should not be used as the sole basis for treatment or other patient management decisions. A negative result may occur with  improper specimen collection/handling, submission of specimen other than nasopharyngeal swab, presence of viral mutation(s) within the areas targeted by this assay, and inadequate number of viral copies(<138 copies/mL). A negative result must be combined with clinical observations, patient history, and epidemiological information. The expected result is Negative.  Fact Sheet for Patients:  EntrepreneurPulse.com.au  Fact Sheet for Healthcare Providers:  IncredibleEmployment.be  This test is no t yet approved or cleared by the [place] FDA and  has been authorized for detection and/or diagnosis of SARS-CoV-2 by FDA under an Emergency Use Authorization (EUA).  This EUA will remain  in effect (meaning this test can be used) for the duration of the COVID-19 declaration under Section 564(b)(1) of the Act, 21 U.S.C.section 360bbb-3(b)(1), unless the authorization is terminated  or revoked sooner.       Influenza A by PCR NEGATIVE NEGATIVE Final   Influenza B by PCR NEGATIVE NEGATIVE Final    Comment: (NOTE) The Xpert Xpress SARS-CoV-2/FLU/RSV plus assay is intended as an aid in the diagnosis of influenza from Nasopharyngeal swab specimens and should not be used as a sole basis for treatment. Nasal washings and aspirates are unacceptable for Xpert Xpress SARS-CoV-2/FLU/RSV testing.  Fact Sheet for Patients: EntrepreneurPulse.com.au  Fact Sheet for Healthcare Providers: IncredibleEmployment.be  This test is not yet approved or cleared by the [place] FDA and has been authorized for detection and/or diagnosis of SARS-CoV-2 by FDA under an Emergency Use Authorization (EUA). This EUA will remain in effect (meaning this test can be used) for the duration of the COVID-19 declaration under Section 564(b)(1) of the Act, 21 U.S.C. section 360bbb-3(b)(1), unless the authorization is terminated or revoked.  Performed at [facility], [address] [place], [place] [address]      Studies: CT HEAD WO CONTRAST (5MM)  Result Date: 09/05/2021 CLINICAL DATA:  Dizziness. EXAM: CT HEAD WITHOUT CONTRAST TECHNIQUE: Contiguous axial images were obtained from the base of the skull through the vertex without intravenous contrast. COMPARISON:  November 17, 2012. FINDINGS: Brain: No evidence of acute infarction, hemorrhage, hydrocephalus, extra-axial collection or mass lesion/mass effect. Vascular: No hyperdense vessel or unexpected calcification. Skull: Normal. Negative for fracture or focal lesion. Sinuses/Orbits: No acute finding. Other: None. IMPRESSION: No acute intracranial abnormality seen. Electronically  Signed   By: Marijo Conception M.D.   On: 09/05/2021 16:15   CT Angio Chest PE W and/or Wo Contrast  Result Date: 09/05/2021 CLINICAL DATA:  Pt to ER via POV with complaints of decreased appetite/ generalized back pain that started 2-3 weeks ago. Reports when he takes a deep breath his whole back hurts. Has been trying to drink ensures but they make him nauseous. Has been taking OTC pain medications with some relief in back pain, denies injury. History of prostate carcinoma. EXAM: CT ANGIOGRAPHY CHEST CT ABDOMEN AND PELVIS WITH CONTRAST TECHNIQUE: Multidetector CT imaging of the chest was performed using the standard protocol during bolus administration of intravenous contrast. Multiplanar CT image reconstructions and MIPs were obtained to evaluate the vascular anatomy. Multidetector CT imaging of the abdomen and pelvis was performed using the standard protocol during bolus administration of intravenous contrast. CONTRAST:  11mL OMNIPAQUE IOHEXOL 350 MG/ML SOLN COMPARISON:  CT a chest, 07/28/2021. CT abdomen and pelvis, 06/10/2021. FINDINGS: CTA CHEST FINDINGS Cardiovascular: Pulmonary  arteries are well opacified. Mild limitation due to respiratory motion affecting assessment of the smaller segmental branches to the lower lungs. Allowing for this mild limitation, there is no evidence of a pulmonary embolism. Heart is normal in size. No pericardial effusion. Left coronary artery calcifications. Great vessels are normal in caliber. Aorta only minimally opacified. No dissection. Mild atherosclerosis. Mediastinum/Nodes: Oval mass at the left neck base, measuring 4.5 x 3.4 x 3.2 cm, consistent with an enlarged lymph node, similar to the prior CT. No mediastinal or hilar masses or enlarged lymph nodes. Mild distension of the esophagus. Trachea is unremarkable. Lungs/Pleura: Linear opacities, both lower lobes, right middle lobe and left upper lobe lingula, consistent with atelectasis. Moderate centrilobular emphysema. No  evidence of pneumonia or pulmonary edema. No mass or suspicious nodule. No pleural effusion or pneumothorax. Musculoskeletal: Multiple sclerotic bone lesions consistent with metastatic disease, without significant change from the prior CT. Mild compression fracture of T9, new since the prior exam. No other fractures. Review of the MIP images confirms the above findings. CT ABDOMEN and PELVIS FINDINGS Hepatobiliary: Liver normal in size and overall attenuation. Several small low-attenuation liver masses, stable consistent with cysts. Two dependent gallstones. No evidence of acute cholecystitis. No bile duct dilation. Pancreas: Unremarkable. No pancreatic ductal dilatation or surrounding inflammatory changes. Spleen: Normal in size without focal abnormality. Adrenals/Urinary Tract: Adrenal glands not well-defined due to adjacent mass effect from adenopathy and the cystic kidneys. Both kidneys are notably enlarged with numerous cysts, unchanged compared to the prior CT. No solid renal masses. No hydronephrosis. Bladder only mildly distended.  Prostate indents the bladder base. Stomach/Bowel: Rectal wall thickening, 1 cm in thickness. Colon mildly distended with moderate increase in the colonic stool burden. No convincing colonic wall thickening or inflammation. Small bowel is normal in caliber. No convincing wall thickening or inflammation. Bowel assessment is somewhat limited by lack of contrast in lack of peritoneal fat. Stomach is unremarkable. Vascular/Lymphatic: Multiple enlarged retroperitoneal lymph nodes which extend to the aortic hiatus. There is a low-attenuation enlarged lymph node at the aortic hiatus measuring 2.7 cm in short axis. Left periaortic node at the level of the left renal vein measures 3 cm in short axis. Nodes posterior to the infrarenal abdominal aorta measures 2.4 cm in short axis. Adenopathy has markedly increased when compared to the prior CT. Aortic atherosclerosis.  Dilated left common  iliac artery to 1.8 cm. Reproductive: Heterogeneous prostate with ill-defined margins, indenting the bladder base, similar to the prior CT. Other: No ascites or abdominal wall hernia. Musculoskeletal: Multiple sclerotic metastatic lesions similar to the prior CT. Mild compression fracture of L4 with depression of the upper endplate new/increased from the prior CT. No other fractures. Review of the MIP images confirms the above findings. IMPRESSION: CTA CHEST 1. Mildly limited due to respiratory motion. Allowing for this, no evidence of a pulmonary embolism. 2. No acute findings. 3. Linear areas of atelectasis/scarring in the lungs. 4. Emphysema. 5. Aortic atherosclerosis. 6. Multiple sclerotic bone lesions reflecting metastatic disease from prostate carcinoma, stable from the recent prior chest CTA. ABDOMEN AND PELVIS CT 1. No convincing acute abnormality. 2. Bulky retroperitoneal adenopathy which has markedly increased when compared to the prior CT consistent with metastatic disease from prostate carcinoma. 3. Moderate increase in the colonic stool burden. No evidence of bowel obstruction or inflammation. 4. Multiple sclerotic bone lesions consistent with metastatic disease, stable from the prior abdomen and pelvis CT. 5. Multiple renal cysts consistent with polycystic kidneys, unchanged from the  prior CT. 6. Aortic atherosclerosis. Electronically Signed   By: Lajean Manes M.D.   On: 09/05/2021 16:32   MR THORACIC SPINE WO CONTRAST  Result Date: 09/05/2021 CLINICAL DATA:  Mid back pain. Three weeks of severe back pain, history of metastatic prostate cancer. EXAM: MRI THORACIC SPINE WITHOUT CONTRAST TECHNIQUE: Multiplanar, multisequence MR imaging of the thoracic spine was performed. No intravenous contrast was administered. COMPARISON:  CT angiogram chest 09/05/2021. FINDINGS: Mild-to-moderate intermittent motion degradation. Alignment: No significant spondylolisthesis. Thoracic dextrocurvature. Vertebrae:  There is multifocal marrow signal abnormality throughout the thoracic spine compatible with diffuse osseous metastatic disease. Mild T9 vertebral compression fracture, (approximately 20% height loss), unchanged as compared to the same-day chest CT of 09/05/2021. Subtle T10 superior endplate deformity with minimal height loss. Vertebral body height is otherwise maintained. Cord: Within the limitations of motion degradation, no spinal cord signal abnormality is identified. Paraspinal and other soft tissues: Trace bilateral pleural effusions. Redemonstrated hepatic cyst. Polycystic kidneys, incompletely imaged. Paraspinal soft tissues unremarkable. Disc levels: Multilevel mild-to-moderate disc degeneration. There are multilevel disc bulges. However, there is no significant focal disc herniation or spinal canal stenosis. No compressive bony neural foraminal narrowing. IMPRESSION: Motion degraded exam. Diffuse osseous metastatic disease throughout the thoracic spine. Mild T9 superior endplate vertebral compression fracture (20% height loss), unchanged as compared to the chest CT performed earlier today. This may reflect a pathologic fracture. Subtle T10 superior endplate deformity with minimal height loss. Thoracic spondylosis, as described. No significant spinal canal stenosis or compressive foraminal narrowing. Electronically Signed   By: Kellie Simmering D.O.   On: 09/05/2021 18:36   MR LUMBAR SPINE WO CONTRAST  Result Date: 09/05/2021 CLINICAL DATA:  Low back pain, cancer suspected. Three week history of severe back pain with history of metastatic prostate cancer. EXAM: MRI LUMBAR SPINE WITHOUT CONTRAST TECHNIQUE: Multiplanar, multisequence MR imaging of the lumbar spine was performed. No intravenous contrast was administered. COMPARISON:  CT abdomen/pelvis 09/05/2021. FINDINGS: Segmentation: 5 lumbar vertebrae. The caudal most well-formed intervertebral disc space is designated L5-S1. Alignment: Straightening of the  expected lumbar lordosis. Trace L3-L4 grade 1 anterolisthesis. Lower lumbar dextrocurvature. Vertebrae: There is multifocal signal abnormality within the lumbar spine, visualized lower thoracic spine, sacrum and pelvis compatible with diffuse osseous metastatic disease. L4 superior endplate compression deformity (30% height loss) unchanged as compared to the CT abdomen/pelvis of 09/05/2021. Vertebral body height is otherwise maintained. Conus medullaris and cauda equina: Conus extends to the T12-L1 level. No signal abnormality within the visualized distal spinal cord. Paraspinal and other soft tissues: Polycystic kidneys and bulky retroperitoneal lymphadenopathy more fully characterized on the prior CT abdomen/pelvis of 09/05/2021. Disc levels: Multilevel disc degeneration. Most notably, there is moderate to moderately advanced disc degeneration at T12-L1, L4-L5 and L5-S1. T12-L1: Disc bulge with endplate spurring. No significant spinal canal or foraminal stenosis. L1-L2: Disc bulge with endplate spurring. No significant spinal canal or foraminal stenosis. L2-L3: Trace grade 1 anterolisthesis. Disc bulge with endplate spurring. Mild facet arthrosis. No significant spinal canal or foraminal stenosis. L3-L4: Trace bony retropulsion at the level of the L4 superior endplate. Disc bulge with endplate spurring. Facet arthrosis. Bilateral subarticular stenosis with potential to affect either descending L4 nerve root. Moderate to moderately severe central canal stenosis. Mild bilateral neural foraminal narrowing (greater on the right). L4-L5: Disc bulge with endplate spurring. Superimposed broad-based right center to right foraminal disc protrusion. Facet arthrosis. The disc protrusion contributes to right subarticular stenosis, encroaching upon the descending right L5 nerve root. Mild central  canal stenosis. No significant foraminal narrowing. L5-S1: Disc bulge with endplate spurring. Facet arthrosis with ligamentum flavum  hypertrophy. Mild bilateral subarticular narrowing with slight crowding of the descending S1 nerve roots. Central canal patent. No significant foraminal stenosis. IMPRESSION: Diffuse osseous metastatic disease within the lumbar spine and visualized lower thoracic spine, sacrum and pelvis. L4 superior endplate compression fracture (30% height loss), unchanged as compared to the CT abdomen/pelvis of 09/05/2021. This may reflect a pathologic fracture. Lumbar spondylosis, as outlined and with findings most notably as follows. At L3-L4, there is multifactorial bilateral subarticular stenosis with potential to affect either descending L4 nerve root. Moderate to moderately advanced central canal stenosis. Mild bilateral neural foraminal narrowing. At L4-L5, there is a broad-based right center to right foraminal disc protrusion contributing to right subarticular stenosis and encroaching upon the descending right L5 nerve root. Mild relative narrowing of the central canal. At L5-S1, there is multifactorial mild bilateral subarticular narrowing with slight crowding of the descending S1 nerve roots. Lower lumbar dextrocurvature. Trace grade 1 anterolisthesis at L3-L4. Polycystic kidneys and bulky retroperitoneal lymphadenopathy more fully characterized on the prior CT abdomen/pelvis. Electronically Signed   By: Kellie Simmering D.O.   On: 09/05/2021 18:29   CT ABDOMEN PELVIS W CONTRAST  Result Date: 09/05/2021 CLINICAL DATA:  Pt to ER via POV with complaints of decreased appetite/ generalized back pain that started 2-3 weeks ago. Reports when he takes a deep breath his whole back hurts. Has been trying to drink ensures but they make him nauseous. Has been taking OTC pain medications with some relief in back pain, denies injury. History of prostate carcinoma. EXAM: CT ANGIOGRAPHY CHEST CT ABDOMEN AND PELVIS WITH CONTRAST TECHNIQUE: Multidetector CT imaging of the chest was performed using the standard protocol during bolus  administration of intravenous contrast. Multiplanar CT image reconstructions and MIPs were obtained to evaluate the vascular anatomy. Multidetector CT imaging of the abdomen and pelvis was performed using the standard protocol during bolus administration of intravenous contrast. CONTRAST:  63mL OMNIPAQUE IOHEXOL 350 MG/ML SOLN COMPARISON:  CT a chest, 07/28/2021. CT abdomen and pelvis, 06/10/2021. FINDINGS: CTA CHEST FINDINGS Cardiovascular: Pulmonary arteries are well opacified. Mild limitation due to respiratory motion affecting assessment of the smaller segmental branches to the lower lungs. Allowing for this mild limitation, there is no evidence of a pulmonary embolism. Heart is normal in size. No pericardial effusion. Left coronary artery calcifications. Great vessels are normal in caliber. Aorta only minimally opacified. No dissection. Mild atherosclerosis. Mediastinum/Nodes: Oval mass at the left neck base, measuring 4.5 x 3.4 x 3.2 cm, consistent with an enlarged lymph node, similar to the prior CT. No mediastinal or hilar masses or enlarged lymph nodes. Mild distension of the esophagus. Trachea is unremarkable. Lungs/Pleura: Linear opacities, both lower lobes, right middle lobe and left upper lobe lingula, consistent with atelectasis. Moderate centrilobular emphysema. No evidence of pneumonia or pulmonary edema. No mass or suspicious nodule. No pleural effusion or pneumothorax. Musculoskeletal: Multiple sclerotic bone lesions consistent with metastatic disease, without significant change from the prior CT. Mild compression fracture of T9, new since the prior exam. No other fractures. Review of the MIP images confirms the above findings. CT ABDOMEN and PELVIS FINDINGS Hepatobiliary: Liver normal in size and overall attenuation. Several small low-attenuation liver masses, stable consistent with cysts. Two dependent gallstones. No evidence of acute cholecystitis. No bile duct dilation. Pancreas: Unremarkable.  No pancreatic ductal dilatation or surrounding inflammatory changes. Spleen: Normal in size without focal abnormality. Adrenals/Urinary Tract:  Adrenal glands not well-defined due to adjacent mass effect from adenopathy and the cystic kidneys. Both kidneys are notably enlarged with numerous cysts, unchanged compared to the prior CT. No solid renal masses. No hydronephrosis. Bladder only mildly distended.  Prostate indents the bladder base. Stomach/Bowel: Rectal wall thickening, 1 cm in thickness. Colon mildly distended with moderate increase in the colonic stool burden. No convincing colonic wall thickening or inflammation. Small bowel is normal in caliber. No convincing wall thickening or inflammation. Bowel assessment is somewhat limited by lack of contrast in lack of peritoneal fat. Stomach is unremarkable. Vascular/Lymphatic: Multiple enlarged retroperitoneal lymph nodes which extend to the aortic hiatus. There is a low-attenuation enlarged lymph node at the aortic hiatus measuring 2.7 cm in short axis. Left periaortic node at the level of the left renal vein measures 3 cm in short axis. Nodes posterior to the infrarenal abdominal aorta measures 2.4 cm in short axis. Adenopathy has markedly increased when compared to the prior CT. Aortic atherosclerosis.  Dilated left common iliac artery to 1.8 cm. Reproductive: Heterogeneous prostate with ill-defined margins, indenting the bladder base, similar to the prior CT. Other: No ascites or abdominal wall hernia. Musculoskeletal: Multiple sclerotic metastatic lesions similar to the prior CT. Mild compression fracture of L4 with depression of the upper endplate new/increased from the prior CT. No other fractures. Review of the MIP images confirms the above findings. IMPRESSION: CTA CHEST 1. Mildly limited due to respiratory motion. Allowing for this, no evidence of a pulmonary embolism. 2. No acute findings. 3. Linear areas of atelectasis/scarring in the lungs. 4.  Emphysema. 5. Aortic atherosclerosis. 6. Multiple sclerotic bone lesions reflecting metastatic disease from prostate carcinoma, stable from the recent prior chest CTA. ABDOMEN AND PELVIS CT 1. No convincing acute abnormality. 2. Bulky retroperitoneal adenopathy which has markedly increased when compared to the prior CT consistent with metastatic disease from prostate carcinoma. 3. Moderate increase in the colonic stool burden. No evidence of bowel obstruction or inflammation. 4. Multiple sclerotic bone lesions consistent with metastatic disease, stable from the prior abdomen and pelvis CT. 5. Multiple renal cysts consistent with polycystic kidneys, unchanged from the prior CT. 6. Aortic atherosclerosis. Electronically Signed   By: Lajean Manes M.D.   On: 09/05/2021 16:32      Flora Lipps, MD  [facility] 09/06/2021  If 7PM-7AM, please contact night-coverage

## 2021-09-06 NOTE — Consult Note (Signed)
Hematology/Oncology Consult note [facility] [phone] [phone]  Patient Care Team: [name], MD (Inactive) as PCP - General (Internal Medicine) Clent Jacks, RN as Oncology Nurse Navigator   Name of the patient: Jacob [name]  [id number]  1934/08/23    Reason for consult: Metastatic prostate cancer   Requesting physician: Dr. 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  He had initially presented to me back in January 2022 with what appeared to be rectal mass but turned out to be locally advanced prostate cancer involving the rectum.  He underwent radiation treatment for his prostate as well as has been on Lupron.  More recently his PSA has been steadily increasing.  It went down to 16 in May 2022 but in August went up to 201.  Scans at that time had shown worsening bony metastatic disease.  Patient lives alone and was being followed by home palliative care.  There have been instances when he has come to get his CT scan and had bedbugs.  There are times when he goes for a couple of days without eating proper food.  Due to these reasons I had concerns about starting him on second line oral antiandrogen drugs given the potential side effects associated with it.  Patient's son was planning to work with the patient to improve his housing conditions and I was planning to reevaluate all this in 1 month's time before deciding about future treatments.  He presented to the ER with symptoms of failure to thrive and worsening fatigue.  He had repeat CT angio chest as well as CT abdomen and pelvis which shows enlarged left neck base mass measuring 4.5 x 3.4 x 3.2 cm.  Worsening retroperitoneal adenopathy markedly increased as compared to prior scan in August 2022.  Multiple areas of bony  metastatic disease which look overall stable as compared to prior scans.  Currently patient reports ongoing fatigue.  Appetite is poor.  He has lost about 15 to 20 pounds of weight in the last 3 to 4 months.  ECOG PS- 3  Pain scale- 5   Review of systems- ROS  No Known Allergies  Patient Active Problem List   Diagnosis Date Noted   Failure to thrive in adult 09/05/2021   AKI (acute kidney injury) ([place]) 09/05/2021   Palliative care encounter    Bedbug bite    Protein-calorie malnutrition, severe 07/29/2021   Acute respiratory failure with hypoxia (HCC)    Lobar pneumonia ([place])    COPD with acute exacerbation ([place])    Weakness    CAP (community acquired pneumonia) 07/28/2021   Parkinson disease ([place]) 07/28/2021   Depression 07/28/2021   Genetic testing 04/12/2021   Family history of prostate cancer    Family history of breast cancer    Goals of care, counseling/discussion 01/18/2021   Prostate cancer metastatic to bone ([place]) 01/14/2021   Vitiligo 10/04/2019   Vitamin D deficiency 12/28/2018   Right inguinal hernia 06/21/2018   Chronic kidney insufficiency, stage 2 (mild) 02/02/2018   Chronic bronchitis, simple ([place]) 12/28/2016   High risk medication use 12/24/2015   Cigarette smoker 12/24/2015   Essential hypertension 06/24/2014   Benign prostatic hyperplasia 06/24/2014   Back pain 06/24/2014   B12 deficiency 06/24/2014     Past Medical History:  Diagnosis Date   BPH (benign prostatic hyperplasia)  Cancer [facility])    Chronic constipation 06/25/2015   COPD (chronic obstructive pulmonary disease) ([place])    pt denies.   Family history of breast cancer    Family history of prostate cancer    Hypertension    Swelling of lower extremity    bilateral   Wears dentures    full upper and lower - do not fit well.     Past Surgical History:  Procedure Laterality Date   CATARACT EXTRACTION W/PHACO Right 09/30/2015   Procedure: CATARACT EXTRACTION PHACO AND INTRAOCULAR  LENS PLACEMENT (IOC);  Surgeon: Leandrew Koyanagi, MD;  Location: [facility];  Service: Ophthalmology;  Laterality: Right;   COLONOSCOPY  2009?   COLONOSCOPY WITH PROPOFOL N/A 01/06/2021   Procedure: COLONOSCOPY WITH PROPOFOL;  Surgeon: Lin Landsman, MD;  Location: [facility] ENDOSCOPY;  Service: Gastroenterology;  Laterality: N/A;   HERNIA REPAIR Right    x2     Social History   Socioeconomic History   Marital status: Married    Spouse name: Not on file   Number of children: Not on file   Years of education: Not on file   Highest education level: Not on file  Occupational History   Not on file  Tobacco Use   Smoking status: Every Day    Packs/day: 0.25    Years: 45.00    Pack years: 11.25    Types: Cigarettes   Smokeless tobacco: Never  Vaping Use   Vaping Use: Never used  Substance and Sexual Activity   Alcohol use: Yes    Comment: drink beer maybe 2 - 3 weeks   Drug use: No   Sexual activity: Not on file  Other Topics Concern   Not on file  Social History Narrative   Not on file   Social Determinants of Health   Financial Resource Strain: Not on file  Food Insecurity: Not on file  Transportation Needs: Not on file  Physical Activity: Not on file  Stress: Not on file  Social Connections: Not on file  Intimate Partner Violence: Not on file     Family History  Problem Relation Age of Onset   Prostate cancer Brother    Alzheimer's disease Mother    Breast cancer Mother        dx 20 or 17   Stroke Father    Heart attack Daughter    Kidney cancer Neg Hx    Kidney disease Neg Hx    Bladder Cancer Neg Hx      Current Facility-Administered Medications:    acetaminophen (TYLENOL) tablet 650 mg, 650 mg, Oral, Q6H PRN **OR** acetaminophen (TYLENOL) suppository 650 mg, 650 mg, Rectal, Q6H PRN, Cox, Amy N, DO   amitriptyline (ELAVIL) tablet 10 mg, 10 mg, Oral, QHS, Cox, Amy N, DO, 10 mg at 09/05/21 2154   aspirin EC tablet 81 mg, 81 mg, Oral, Daily,  Cox, Amy N, DO, 81 mg at 09/06/21 0911   carbidopa-levodopa (SINEMET IR) 25-100 MG per tablet immediate release 1 tablet, 1 tablet, Oral, TID, Cox, Amy N, DO, 1 tablet at 09/06/21 0911   enoxaparin (LOVENOX) injection 40 mg, 40 mg, Subcutaneous, QHS, Cox, Amy N, DO, 40 mg at 09/05/21 2154   feeding supplement (ENSURE ENLIVE / ENSURE PLUS) liquid 237 mL, 237 mL, Oral, TID BM, Cox, Amy N, DO, 237 mL at 09/06/21 0912   fentaNYL (SUBLIMAZE) injection 25 mcg, 25 mcg, Intravenous, Q2H PRN, Cox, Amy N, DO   finasteride (PROSCAR) tablet 5 mg, 5  mg, Oral, Daily, Cox, Amy N, DO, 5 mg at 09/06/21 0911   gabapentin (NEURONTIN) capsule 100 mg, 100 mg, Oral, BID, Cox, Amy N, DO, 100 mg at 09/06/21 0911   influenza vaccine adjuvanted (FLUAD) injection 0.5 mL, 0.5 mL, Intramuscular, Tomorrow-1000, Cox, Amy N, DO   lidocaine (LIDODERM) 5 % 2 patch, 2 patch, Transdermal, Q24H, Cox, Amy N, DO, 2 patch at 09/05/21 2206   mirtazapine (REMERON) tablet 7.5 mg, 7.5 mg, Oral, QHS, Cox, Amy N, DO, 7.5 mg at 09/05/21 2155   morphine 4 MG/ML injection 4 mg, 4 mg, Intravenous, Q4H PRN, Cox, Amy N, DO   multivitamin with minerals tablet 1 tablet, 1 tablet, Oral, Daily, Pokhrel, Laxman, MD   ondansetron (ZOFRAN) tablet 4 mg, 4 mg, Oral, Q6H PRN **OR** ondansetron (ZOFRAN) injection 4 mg, 4 mg, Intravenous, Q6H PRN, Cox, Amy N, DO   oxyCODONE-acetaminophen (PERCOCET/ROXICET) 5-325 MG per tablet 1-2 tablet, 1-2 tablet, Oral, Q6H PRN, Pokhrel, Laxman, MD   polyethylene glycol (MIRALAX / GLYCOLAX) packet 17 g, 17 g, Oral, BID, Pokhrel, Laxman, MD   tamsulosin (FLOMAX) capsule 0.4 mg, 0.4 mg, Oral, Daily, Cox, Amy N, DO, 0.4 mg at 09/06/21 0912   vitamin B-12 (CYANOCOBALAMIN) tablet 500 mcg, 500 mcg, Oral, Daily, Cox, Amy N, DO, 500 mcg at 09/06/21 0911   Physical exam:  Vitals:   09/06/21 0037 09/06/21 0618 09/06/21 0800 09/06/21 1158  BP: 126/66 121/74 115/78 112/65  Pulse: (!) 59 60 60 (!) 45  Resp: 17 16 18 20   Temp: 98 F  (36.7 C) 98.2 F (36.8 C)  97.7 F (36.5 C)  TempSrc:  Oral Oral Oral  SpO2:  92% 90% 90%  Weight:      Height:       Physical Exam     CMP Latest Ref Rng & Units 09/06/2021  Glucose 70 - 99 mg/dL 87  BUN 8 - 23 mg/dL 29(H)  Creatinine 0.61 - 1.24 mg/dL 0.99  Sodium 135 - 145 mmol/L 139  Potassium 3.5 - 5.1 mmol/L 3.8  Chloride 98 - 111 mmol/L 101  CO2 22 - 32 mmol/L 30  Calcium 8.9 - 10.3 mg/dL 8.8(L)  Total Protein 6.5 - 8.1 g/dL -  Total Bilirubin 0.3 - 1.2 mg/dL -  Alkaline Phos 38 - 126 U/L -  AST 15 - 41 U/L -  ALT 0 - 44 U/L -   CBC Latest Ref Rng & Units 09/06/2021  WBC 4.0 - 10.5 K/uL 6.9  Hemoglobin 13.0 - 17.0 g/dL 13.9  Hematocrit 39.0 - 52.0 % 43.8  Platelets 150 - 400 K/uL 368    @IMAGES @  CT HEAD WO CONTRAST (5MM)  Result Date: 09/05/2021 CLINICAL DATA:  Dizziness. EXAM: CT HEAD WITHOUT CONTRAST TECHNIQUE: Contiguous axial images were obtained from the base of the skull through the vertex without intravenous contrast. COMPARISON:  November 17, 2012. FINDINGS: Brain: No evidence of acute infarction, hemorrhage, hydrocephalus, extra-axial collection or mass lesion/mass effect. Vascular: No hyperdense vessel or unexpected calcification. Skull: Normal. Negative for fracture or focal lesion. Sinuses/Orbits: No acute finding. Other: None. IMPRESSION: No acute intracranial abnormality seen. Electronically Signed   By: Marijo Conception M.D.   On: 09/05/2021 16:15   CT Angio Chest PE W and/or Wo Contrast  Result Date: 09/05/2021 CLINICAL DATA:  Pt to ER via POV with complaints of decreased appetite/ generalized back pain that started 2-3 weeks ago. Reports when he takes a deep breath his whole back hurts. Has been trying to  drink ensures but they make him nauseous. Has been taking OTC pain medications with some relief in back pain, denies injury. History of prostate carcinoma. EXAM: CT ANGIOGRAPHY CHEST CT ABDOMEN AND PELVIS WITH CONTRAST TECHNIQUE: Multidetector CT  imaging of the chest was performed using the standard protocol during bolus administration of intravenous contrast. Multiplanar CT image reconstructions and MIPs were obtained to evaluate the vascular anatomy. Multidetector CT imaging of the abdomen and pelvis was performed using the standard protocol during bolus administration of intravenous contrast. CONTRAST:  87mL OMNIPAQUE IOHEXOL 350 MG/ML SOLN COMPARISON:  CT a chest, 07/28/2021. CT abdomen and pelvis, 06/10/2021. FINDINGS: CTA CHEST FINDINGS Cardiovascular: Pulmonary arteries are well opacified. Mild limitation due to respiratory motion affecting assessment of the smaller segmental branches to the lower lungs. Allowing for this mild limitation, there is no evidence of a pulmonary embolism. Heart is normal in size. No pericardial effusion. Left coronary artery calcifications. Great vessels are normal in caliber. Aorta only minimally opacified. No dissection. Mild atherosclerosis. Mediastinum/Nodes: Oval mass at the left neck base, measuring 4.5 x 3.4 x 3.2 cm, consistent with an enlarged lymph node, similar to the prior CT. No mediastinal or hilar masses or enlarged lymph nodes. Mild distension of the esophagus. Trachea is unremarkable. Lungs/Pleura: Linear opacities, both lower lobes, right middle lobe and left upper lobe lingula, consistent with atelectasis. Moderate centrilobular emphysema. No evidence of pneumonia or pulmonary edema. No mass or suspicious nodule. No pleural effusion or pneumothorax. Musculoskeletal: Multiple sclerotic bone lesions consistent with metastatic disease, without significant change from the prior CT. Mild compression fracture of T9, new since the prior exam. No other fractures. Review of the MIP images confirms the above findings. CT ABDOMEN and PELVIS FINDINGS Hepatobiliary: Liver normal in size and overall attenuation. Several small low-attenuation liver masses, stable consistent with cysts. Two dependent gallstones. No  evidence of acute cholecystitis. No bile duct dilation. Pancreas: Unremarkable. No pancreatic ductal dilatation or surrounding inflammatory changes. Spleen: Normal in size without focal abnormality. Adrenals/Urinary Tract: Adrenal glands not well-defined due to adjacent mass effect from adenopathy and the cystic kidneys. Both kidneys are notably enlarged with numerous cysts, unchanged compared to the prior CT. No solid renal masses. No hydronephrosis. Bladder only mildly distended.  Prostate indents the bladder base. Stomach/Bowel: Rectal wall thickening, 1 cm in thickness. Colon mildly distended with moderate increase in the colonic stool burden. No convincing colonic wall thickening or inflammation. Small bowel is normal in caliber. No convincing wall thickening or inflammation. Bowel assessment is somewhat limited by lack of contrast in lack of peritoneal fat. Stomach is unremarkable. Vascular/Lymphatic: Multiple enlarged retroperitoneal lymph nodes which extend to the aortic hiatus. There is a low-attenuation enlarged lymph node at the aortic hiatus measuring 2.7 cm in short axis. Left periaortic node at the level of the left renal vein measures 3 cm in short axis. Nodes posterior to the infrarenal abdominal aorta measures 2.4 cm in short axis. Adenopathy has markedly increased when compared to the prior CT. Aortic atherosclerosis.  Dilated left common iliac artery to 1.8 cm. Reproductive: Heterogeneous prostate with ill-defined margins, indenting the bladder base, similar to the prior CT. Other: No ascites or abdominal wall hernia. Musculoskeletal: Multiple sclerotic metastatic lesions similar to the prior CT. Mild compression fracture of L4 with depression of the upper endplate new/increased from the prior CT. No other fractures. Review of the MIP images confirms the above findings. IMPRESSION: CTA CHEST 1. Mildly limited due to respiratory motion. Allowing for this, no evidence  of a pulmonary embolism. 2. No  acute findings. 3. Linear areas of atelectasis/scarring in the lungs. 4. Emphysema. 5. Aortic atherosclerosis. 6. Multiple sclerotic bone lesions reflecting metastatic disease from prostate carcinoma, stable from the recent prior chest CTA. ABDOMEN AND PELVIS CT 1. No convincing acute abnormality. 2. Bulky retroperitoneal adenopathy which has markedly increased when compared to the prior CT consistent with metastatic disease from prostate carcinoma. 3. Moderate increase in the colonic stool burden. No evidence of bowel obstruction or inflammation. 4. Multiple sclerotic bone lesions consistent with metastatic disease, stable from the prior abdomen and pelvis CT. 5. Multiple renal cysts consistent with polycystic kidneys, unchanged from the prior CT. 6. Aortic atherosclerosis. Electronically Signed   By: Lajean Manes M.D.   On: 09/05/2021 16:32   MR THORACIC SPINE WO CONTRAST  Result Date: 09/05/2021 CLINICAL DATA:  Mid back pain. Three weeks of severe back pain, history of metastatic prostate cancer. EXAM: MRI THORACIC SPINE WITHOUT CONTRAST TECHNIQUE: Multiplanar, multisequence MR imaging of the thoracic spine was performed. No intravenous contrast was administered. COMPARISON:  CT angiogram chest 09/05/2021. FINDINGS: Mild-to-moderate intermittent motion degradation. Alignment: No significant spondylolisthesis. Thoracic dextrocurvature. Vertebrae: There is multifocal marrow signal abnormality throughout the thoracic spine compatible with diffuse osseous metastatic disease. Mild T9 vertebral compression fracture, (approximately 20% height loss), unchanged as compared to the same-day chest CT of 09/05/2021. Subtle T10 superior endplate deformity with minimal height loss. Vertebral body height is otherwise maintained. Cord: Within the limitations of motion degradation, no spinal cord signal abnormality is identified. Paraspinal and other soft tissues: Trace bilateral pleural effusions. Redemonstrated hepatic  cyst. Polycystic kidneys, incompletely imaged. Paraspinal soft tissues unremarkable. Disc levels: Multilevel mild-to-moderate disc degeneration. There are multilevel disc bulges. However, there is no significant focal disc herniation or spinal canal stenosis. No compressive bony neural foraminal narrowing. IMPRESSION: Motion degraded exam. Diffuse osseous metastatic disease throughout the thoracic spine. Mild T9 superior endplate vertebral compression fracture (20% height loss), unchanged as compared to the chest CT performed earlier today. This may reflect a pathologic fracture. Subtle T10 superior endplate deformity with minimal height loss. Thoracic spondylosis, as described. No significant spinal canal stenosis or compressive foraminal narrowing. Electronically Signed   By: Kellie Simmering D.O.   On: 09/05/2021 18:36   MR LUMBAR SPINE WO CONTRAST  Result Date: 09/05/2021 CLINICAL DATA:  Low back pain, cancer suspected. Three week history of severe back pain with history of metastatic prostate cancer. EXAM: MRI LUMBAR SPINE WITHOUT CONTRAST TECHNIQUE: Multiplanar, multisequence MR imaging of the lumbar spine was performed. No intravenous contrast was administered. COMPARISON:  CT abdomen/pelvis 09/05/2021. FINDINGS: Segmentation: 5 lumbar vertebrae. The caudal most well-formed intervertebral disc space is designated L5-S1. Alignment: Straightening of the expected lumbar lordosis. Trace L3-L4 grade 1 anterolisthesis. Lower lumbar dextrocurvature. Vertebrae: There is multifocal signal abnormality within the lumbar spine, visualized lower thoracic spine, sacrum and pelvis compatible with diffuse osseous metastatic disease. L4 superior endplate compression deformity (30% height loss) unchanged as compared to the CT abdomen/pelvis of 09/05/2021. Vertebral body height is otherwise maintained. Conus medullaris and cauda equina: Conus extends to the T12-L1 level. No signal abnormality within the visualized distal spinal  cord. Paraspinal and other soft tissues: Polycystic kidneys and bulky retroperitoneal lymphadenopathy more fully characterized on the prior CT abdomen/pelvis of 09/05/2021. Disc levels: Multilevel disc degeneration. Most notably, there is moderate to moderately advanced disc degeneration at T12-L1, L4-L5 and L5-S1. T12-L1: Disc bulge with endplate spurring. No significant spinal canal or foraminal stenosis. L1-L2: Disc  bulge with endplate spurring. No significant spinal canal or foraminal stenosis. L2-L3: Trace grade 1 anterolisthesis. Disc bulge with endplate spurring. Mild facet arthrosis. No significant spinal canal or foraminal stenosis. L3-L4: Trace bony retropulsion at the level of the L4 superior endplate. Disc bulge with endplate spurring. Facet arthrosis. Bilateral subarticular stenosis with potential to affect either descending L4 nerve root. Moderate to moderately severe central canal stenosis. Mild bilateral neural foraminal narrowing (greater on the right). L4-L5: Disc bulge with endplate spurring. Superimposed broad-based right center to right foraminal disc protrusion. Facet arthrosis. The disc protrusion contributes to right subarticular stenosis, encroaching upon the descending right L5 nerve root. Mild central canal stenosis. No significant foraminal narrowing. L5-S1: Disc bulge with endplate spurring. Facet arthrosis with ligamentum flavum hypertrophy. Mild bilateral subarticular narrowing with slight crowding of the descending S1 nerve roots. Central canal patent. No significant foraminal stenosis. IMPRESSION: Diffuse osseous metastatic disease within the lumbar spine and visualized lower thoracic spine, sacrum and pelvis. L4 superior endplate compression fracture (30% height loss), unchanged as compared to the CT abdomen/pelvis of 09/05/2021. This may reflect a pathologic fracture. Lumbar spondylosis, as outlined and with findings most notably as follows. At L3-L4, there is multifactorial  bilateral subarticular stenosis with potential to affect either descending L4 nerve root. Moderate to moderately advanced central canal stenosis. Mild bilateral neural foraminal narrowing. At L4-L5, there is a broad-based right center to right foraminal disc protrusion contributing to right subarticular stenosis and encroaching upon the descending right L5 nerve root. Mild relative narrowing of the central canal. At L5-S1, there is multifactorial mild bilateral subarticular narrowing with slight crowding of the descending S1 nerve roots. Lower lumbar dextrocurvature. Trace grade 1 anterolisthesis at L3-L4. Polycystic kidneys and bulky retroperitoneal lymphadenopathy more fully characterized on the prior CT abdomen/pelvis. Electronically Signed   By: Kellie Simmering D.O.   On: 09/05/2021 18:29   CT ABDOMEN PELVIS W CONTRAST  Result Date: 09/05/2021 CLINICAL DATA:  Pt to ER via POV with complaints of decreased appetite/ generalized back pain that started 2-3 weeks ago. Reports when he takes a deep breath his whole back hurts. Has been trying to drink ensures but they make him nauseous. Has been taking OTC pain medications with some relief in back pain, denies injury. History of prostate carcinoma. EXAM: CT ANGIOGRAPHY CHEST CT ABDOMEN AND PELVIS WITH CONTRAST TECHNIQUE: Multidetector CT imaging of the chest was performed using the standard protocol during bolus administration of intravenous contrast. Multiplanar CT image reconstructions and MIPs were obtained to evaluate the vascular anatomy. Multidetector CT imaging of the abdomen and pelvis was performed using the standard protocol during bolus administration of intravenous contrast. CONTRAST:  52mL OMNIPAQUE IOHEXOL 350 MG/ML SOLN COMPARISON:  CT a chest, 07/28/2021. CT abdomen and pelvis, 06/10/2021. FINDINGS: CTA CHEST FINDINGS Cardiovascular: Pulmonary arteries are well opacified. Mild limitation due to respiratory motion affecting assessment of the smaller  segmental branches to the lower lungs. Allowing for this mild limitation, there is no evidence of a pulmonary embolism. Heart is normal in size. No pericardial effusion. Left coronary artery calcifications. Great vessels are normal in caliber. Aorta only minimally opacified. No dissection. Mild atherosclerosis. Mediastinum/Nodes: Oval mass at the left neck base, measuring 4.5 x 3.4 x 3.2 cm, consistent with an enlarged lymph node, similar to the prior CT. No mediastinal or hilar masses or enlarged lymph nodes. Mild distension of the esophagus. Trachea is unremarkable. Lungs/Pleura: Linear opacities, both lower lobes, right middle lobe and left upper lobe lingula, consistent with  atelectasis. Moderate centrilobular emphysema. No evidence of pneumonia or pulmonary edema. No mass or suspicious nodule. No pleural effusion or pneumothorax. Musculoskeletal: Multiple sclerotic bone lesions consistent with metastatic disease, without significant change from the prior CT. Mild compression fracture of T9, new since the prior exam. No other fractures. Review of the MIP images confirms the above findings. CT ABDOMEN and PELVIS FINDINGS Hepatobiliary: Liver normal in size and overall attenuation. Several small low-attenuation liver masses, stable consistent with cysts. Two dependent gallstones. No evidence of acute cholecystitis. No bile duct dilation. Pancreas: Unremarkable. No pancreatic ductal dilatation or surrounding inflammatory changes. Spleen: Normal in size without focal abnormality. Adrenals/Urinary Tract: Adrenal glands not well-defined due to adjacent mass effect from adenopathy and the cystic kidneys. Both kidneys are notably enlarged with numerous cysts, unchanged compared to the prior CT. No solid renal masses. No hydronephrosis. Bladder only mildly distended.  Prostate indents the bladder base. Stomach/Bowel: Rectal wall thickening, 1 cm in thickness. Colon mildly distended with moderate increase in the colonic  stool burden. No convincing colonic wall thickening or inflammation. Small bowel is normal in caliber. No convincing wall thickening or inflammation. Bowel assessment is somewhat limited by lack of contrast in lack of peritoneal fat. Stomach is unremarkable. Vascular/Lymphatic: Multiple enlarged retroperitoneal lymph nodes which extend to the aortic hiatus. There is a low-attenuation enlarged lymph node at the aortic hiatus measuring 2.7 cm in short axis. Left periaortic node at the level of the left renal vein measures 3 cm in short axis. Nodes posterior to the infrarenal abdominal aorta measures 2.4 cm in short axis. Adenopathy has markedly increased when compared to the prior CT. Aortic atherosclerosis.  Dilated left common iliac artery to 1.8 cm. Reproductive: Heterogeneous prostate with ill-defined margins, indenting the bladder base, similar to the prior CT. Other: No ascites or abdominal wall hernia. Musculoskeletal: Multiple sclerotic metastatic lesions similar to the prior CT. Mild compression fracture of L4 with depression of the upper endplate new/increased from the prior CT. No other fractures. Review of the MIP images confirms the above findings. IMPRESSION: CTA CHEST 1. Mildly limited due to respiratory motion. Allowing for this, no evidence of a pulmonary embolism. 2. No acute findings. 3. Linear areas of atelectasis/scarring in the lungs. 4. Emphysema. 5. Aortic atherosclerosis. 6. Multiple sclerotic bone lesions reflecting metastatic disease from prostate carcinoma, stable from the recent prior chest CTA. ABDOMEN AND PELVIS CT 1. No convincing acute abnormality. 2. Bulky retroperitoneal adenopathy which has markedly increased when compared to the prior CT consistent with metastatic disease from prostate carcinoma. 3. Moderate increase in the colonic stool burden. No evidence of bowel obstruction or inflammation. 4. Multiple sclerotic bone lesions consistent with metastatic disease, stable from the  prior abdomen and pelvis CT. 5. Multiple renal cysts consistent with polycystic kidneys, unchanged from the prior CT. 6. Aortic atherosclerosis. Electronically Signed   By: Lajean Manes M.D.   On: 09/05/2021 16:32    Assessment and plan- Patient is a 87 y.o. 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  Oncology consulted for further goals of care  Overall patient's condition appears to be declining.  He is severely cachectic and has had considerable weight loss.  He lives alone at his home and home palliative care has been following him.There are days sometimes when he goes without eating proper meals.  There have been instances of bedbugs in the past.  Most recent palliative care outpatient note mentions that his medications are all over the house.  He  last received Lupron in July 2022 when was supposed to get his next dose in October 2022 as an outpatient.  I will get in touch with patient's son today to address his overall goals of care.  Patient is agreeable to go to a long-term facility if need be.  It would be difficult to treat him with any oral antiandrogen drugs unless he is in a supervised setting given that these drugs are associated with side effects such asElectrolyte disturbances, abnormal LFTs myopathies.  Drug such as Zytiga need to be taken on an empty stomach and their absorption can sometimes be increased tenfold if taken with a meal.  Based on patient's present living conditions it does not appear likely that patient can take his medications in a consistent and correct manner.  If we are unable to provide a safe supervised environment for the patient-it would not be safe to administer oral antiandrogen medications to this patient.  He is not a candidate for systemic chemotherapy and therefore hospice would be appropriate.  If however patient can go to a supervised environment such as nursing home-we could perhaps consider oral antiandrogen therapy such as  Zytiga or Xtandi which can lead to symptomatic improvement and potentially improve his survival as well.  I would have palliative care assessed the patient and I will have a conversation with the palliative care team as well as patient's son to see how we can care for this patient moving forward in a safe environment.  Back pain/bone metastases: I will reach out to Dr. Donella Stade from radiation oncology to seeIf he would be able to offer any palliative radiation especially to the T9 vertebral area where there may be a component of pathological fracture.  Continue pain medications with as needed Percocet at this time.  I will continue to assess him as an outpatient and decide about long-acting pain medications.  He would also benefit from 1 dose of bisphosphonate such as Zometa that can help him with his pain from bone metastases.  Also okay to initiate a short course of prednisone 10 mg daily for the next 5 to 6 days to assist with bone pain.   Thank you for this kind referral and the opportunity to participate in the care of this patient   Visit Diagnosis 1. AKI (acute kidney injury) ([place])   2. Failure to thrive in adult   3. Prostate cancer metastatic to bone [facility])     Dr. Randa Evens, MD, MPH [facility] at [facility] [phone] 09/06/2021

## 2021-09-07 DIAGNOSIS — C7951 Secondary malignant neoplasm of bone: Secondary | ICD-10-CM | POA: Diagnosis present

## 2021-09-07 DIAGNOSIS — I493 Ventricular premature depolarization: Secondary | ICD-10-CM | POA: Diagnosis present

## 2021-09-07 DIAGNOSIS — E785 Hyperlipidemia, unspecified: Secondary | ICD-10-CM | POA: Diagnosis present

## 2021-09-07 DIAGNOSIS — R531 Weakness: Secondary | ICD-10-CM

## 2021-09-07 DIAGNOSIS — J432 Centrilobular emphysema: Secondary | ICD-10-CM | POA: Diagnosis present

## 2021-09-07 DIAGNOSIS — I10 Essential (primary) hypertension: Secondary | ICD-10-CM | POA: Diagnosis present

## 2021-09-07 DIAGNOSIS — Z789 Other specified health status: Secondary | ICD-10-CM

## 2021-09-07 DIAGNOSIS — G20 Parkinson's disease: Secondary | ICD-10-CM | POA: Diagnosis present

## 2021-09-07 DIAGNOSIS — F419 Anxiety disorder, unspecified: Secondary | ICD-10-CM | POA: Diagnosis present

## 2021-09-07 DIAGNOSIS — M545 Low back pain, unspecified: Secondary | ICD-10-CM | POA: Diagnosis present

## 2021-09-07 DIAGNOSIS — F32A Depression, unspecified: Secondary | ICD-10-CM | POA: Diagnosis present

## 2021-09-07 DIAGNOSIS — F17210 Nicotine dependence, cigarettes, uncomplicated: Secondary | ICD-10-CM | POA: Diagnosis present

## 2021-09-07 DIAGNOSIS — Z20822 Contact with and (suspected) exposure to covid-19: Secondary | ICD-10-CM | POA: Diagnosis present

## 2021-09-07 DIAGNOSIS — J441 Chronic obstructive pulmonary disease with (acute) exacerbation: Secondary | ICD-10-CM | POA: Diagnosis not present

## 2021-09-07 DIAGNOSIS — G629 Polyneuropathy, unspecified: Secondary | ICD-10-CM | POA: Diagnosis present

## 2021-09-07 DIAGNOSIS — I248 Other forms of acute ischemic heart disease: Secondary | ICD-10-CM | POA: Diagnosis present

## 2021-09-07 DIAGNOSIS — E43 Unspecified severe protein-calorie malnutrition: Secondary | ICD-10-CM | POA: Diagnosis present

## 2021-09-07 DIAGNOSIS — G893 Neoplasm related pain (acute) (chronic): Secondary | ICD-10-CM | POA: Diagnosis present

## 2021-09-07 DIAGNOSIS — M4854XA Collapsed vertebra, not elsewhere classified, thoracic region, initial encounter for fracture: Secondary | ICD-10-CM | POA: Diagnosis present

## 2021-09-07 DIAGNOSIS — Z681 Body mass index (BMI) 19 or less, adult: Secondary | ICD-10-CM | POA: Diagnosis not present

## 2021-09-07 DIAGNOSIS — N179 Acute kidney failure, unspecified: Secondary | ICD-10-CM

## 2021-09-07 DIAGNOSIS — M4856XA Collapsed vertebra, not elsewhere classified, lumbar region, initial encounter for fracture: Secondary | ICD-10-CM | POA: Diagnosis present

## 2021-09-07 DIAGNOSIS — R627 Adult failure to thrive: Secondary | ICD-10-CM | POA: Diagnosis not present

## 2021-09-07 DIAGNOSIS — Z23 Encounter for immunization: Secondary | ICD-10-CM | POA: Diagnosis present

## 2021-09-07 DIAGNOSIS — E869 Volume depletion, unspecified: Secondary | ICD-10-CM | POA: Diagnosis present

## 2021-09-07 DIAGNOSIS — Z515 Encounter for palliative care: Secondary | ICD-10-CM

## 2021-09-07 DIAGNOSIS — Z8701 Personal history of pneumonia (recurrent): Secondary | ICD-10-CM | POA: Diagnosis not present

## 2021-09-07 DIAGNOSIS — K5909 Other constipation: Secondary | ICD-10-CM | POA: Diagnosis present

## 2021-09-07 DIAGNOSIS — N400 Enlarged prostate without lower urinary tract symptoms: Secondary | ICD-10-CM | POA: Diagnosis not present

## 2021-09-07 DIAGNOSIS — E538 Deficiency of other specified B group vitamins: Secondary | ICD-10-CM | POA: Diagnosis present

## 2021-09-07 DIAGNOSIS — C61 Malignant neoplasm of prostate: Secondary | ICD-10-CM | POA: Diagnosis present

## 2021-09-07 LAB — CBC
HCT: 45.6 % (ref 39.0–52.0)
Hemoglobin: 14.3 g/dL (ref 13.0–17.0)
MCH: 26.8 pg (ref 26.0–34.0)
MCHC: 31.4 g/dL (ref 30.0–36.0)
MCV: 85.4 fL (ref 80.0–100.0)
Platelets: 414 10*3/uL — ABNORMAL HIGH (ref 150–400)
RBC: 5.34 MIL/uL (ref 4.22–5.81)
RDW: 14.5 % (ref 11.5–15.5)
WBC: 5.6 10*3/uL (ref 4.0–10.5)
nRBC: 0 % (ref 0.0–0.2)

## 2021-09-07 LAB — BASIC METABOLIC PANEL
Anion gap: 9 (ref 5–15)
BUN: 22 mg/dL (ref 8–23)
CO2: 30 mmol/L (ref 22–32)
Calcium: 8.9 mg/dL (ref 8.9–10.3)
Chloride: 101 mmol/L (ref 98–111)
Creatinine, Ser: 0.95 mg/dL (ref 0.61–1.24)
GFR, Estimated: >60 mL/min (ref >60)
Glucose, Bld: 96 mg/dL (ref 70–99)
Potassium: 3.7 mmol/L (ref 3.5–5.1)
Sodium: 140 mmol/L (ref 135–145)

## 2021-09-07 LAB — MAGNESIUM: Magnesium: 1.6 mg/dL — ABNORMAL LOW (ref 1.7–2.4)

## 2021-09-07 MED ORDER — SODIUM CHLORIDE 0.9 % IV SOLN: INTRAVENOUS | Status: DC

## 2021-09-07 MED ORDER — POLYETHYLENE GLYCOL 3350 17 G PO PACK: 17.0000 g | PACK | Freq: Every day | ORAL | Status: DC

## 2021-09-07 MED ORDER — METOPROLOL SUCCINATE ER 50 MG PO TB24
50.0000 mg | ORAL_TABLET | Freq: Every day | ORAL | Status: DC
  Administered 2021-09-10: 50 mg via ORAL
  Filled 2021-09-07 (×3): qty 1

## 2021-09-07 MED ORDER — AMLODIPINE BESYLATE 5 MG PO TABS
5.0000 mg | ORAL_TABLET | Freq: Every day | ORAL | Status: DC
  Administered 2021-09-07 – 2021-09-10 (×4): 5 mg via ORAL
  Filled 2021-09-07 (×4): qty 1

## 2021-09-07 MED ORDER — POLYETHYLENE GLYCOL 3350 17 G PO PACK: 17.0000 g | PACK | Freq: Every day | ORAL | Status: DC | PRN

## 2021-09-07 MED ORDER — MORPHINE SULFATE (PF) 4 MG/ML IV SOLN
4.0000 mg | INTRAVENOUS | Status: DC | PRN
Start: 2021-09-07 — End: 2021-09-10

## 2021-09-07 MED ORDER — MAGNESIUM OXIDE -MG SUPPLEMENT 400 (240 MG) MG PO TABS
400.0000 mg | ORAL_TABLET | Freq: Two times a day (BID) | ORAL | Status: DC
  Administered 2021-09-07 – 2021-09-09 (×6): 400 mg via ORAL
  Filled 2021-09-07 (×7): qty 1

## 2021-09-07 NOTE — TOC Initial Note (Signed)
Transition of Care [facility]) - Initial/Assessment Note    Patient Details  Name: Jacob [name] MRN: [id number] Date of Birth: [birth year]  Transition of Care [facility]) CM/SW Contact:    Pete Pelt, RN Phone Number: 09/07/2021, 4:30 PM  Clinical Narrative:     Patient lives at home alone, wife is in a facility, and son belives she will be a long term placement.  Son and patient would like SNF placement, but would like to discuss with palliative and possibly hospice depending on further conversations.  Will regroup tomorrow and discuss with son.  Left message for palliative to contact patient's son re: assessment and disposition.         Expected Discharge Plan:  (TBD) Barriers to Discharge: Continued Medical Work up   Patient Goals and CMS Choice Patient states their goals for this hospitalization and ongoing recovery are:: To be in a facility      Expected Discharge Plan and Services Expected Discharge Plan:  (TBD) In-house Referral: Hospice / Palliative Care Discharge Planning Services: CM Consult   Living arrangements for the past 2 months: Single Family Home                                      Prior Living Arrangements/Services Living arrangements for the past 2 months: Single Family Home Lives with:: Self (Wife is in a rehab at present, patient lives alone.) Patient language and need for interpreter reviewed:: Yes (No need for interpreter) Do you feel safe going back to the place where you live?: No   Spouse is currently in a facilty, patient lives alone and cannot care for himself, per patient and son      Current home services:  (Patient has no home services)    Activities of Daily Living Home Assistive Devices/Equipment: Cane (specify quad or straight), Walker (specify type) ADL Screening (condition at time of admission) Patient's cognitive ability adequate to safely complete daily activities?: Yes Is the patient deaf or have difficulty hearing?: Yes Does  the patient have difficulty seeing, even when wearing glasses/contacts?: No Does the patient have difficulty concentrating, remembering, or making decisions?: No Patient able to express need for assistance with ADLs?: Yes Does the patient have difficulty dressing or bathing?: Yes Independently performs ADLs?: Yes (appropriate for developmental age) Does the patient have difficulty walking or climbing stairs?: Yes Weakness of Legs: Both Weakness of Arms/Hands: Both  Permission Sought/Granted Permission sought to share information with : Customer service manager, Other (comment)                Emotional Assessment Appearance:: Appears stated age Attitude/Demeanor/Rapport: Lethargic Affect (typically observed): Pleasant Orientation: : Oriented to Self Alcohol / Substance Use: Not Applicable Psych Involvement: No (comment)  Admission diagnosis:  Failure to thrive in adult [R62.7] AKI (acute kidney injury) ([place]) [N17.9] Prostate cancer metastatic to bone ([place]) [C61, C79.51] Patient Active Problem List   Diagnosis Date Noted   Palliative care by specialist    Full code status    Failure to thrive in adult 09/05/2021   AKI (acute kidney injury) ([place]) 09/05/2021   Palliative care encounter    Bedbug bite    Protein-calorie malnutrition, severe 07/29/2021   Acute respiratory failure with hypoxia ([place])    Lobar pneumonia ([place])    COPD with acute exacerbation ([place])    Weakness    CAP (community acquired pneumonia) 07/28/2021   Parkinson  disease ([place]) 07/28/2021   Depression 07/28/2021   Genetic testing 04/12/2021   Family history of prostate cancer    Family history of breast cancer    Goals of care, counseling/discussion 01/18/2021   Prostate cancer metastatic to bone ([place]) 01/14/2021   Vitiligo 10/04/2019   Vitamin D deficiency 12/28/2018   Right inguinal hernia 06/21/2018   Chronic kidney insufficiency, stage 2 (mild) 02/02/2018   Chronic bronchitis, simple ([place])  12/28/2016   High risk medication use 12/24/2015   Cigarette smoker 12/24/2015   Essential hypertension 06/24/2014   Benign prostatic hyperplasia 06/24/2014   Back pain 06/24/2014   B12 deficiency 06/24/2014   PCP:  [name], MD (Inactive) Pharmacy:   [id number] [address], [place] [address] Phone: [phone] Fax: [phone]     Social Determinants of Health (SDOH) Interventions    Readmission Risk Interventions No flowsheet data found.

## 2021-09-07 NOTE — Consult Note (Addendum)
Consultation Note Date: 09/07/2021   Patient Name: Jacob [name]  DOB: 09-12-1934  MRN: [id number]  Age / Sex: 87 y.o., male  PCP: [name], MD (Inactive) Referring Physician: Flora Lipps, MD  Reason for Consultation: Establishing goals of care  HPI/Patient Profile: 87 y.o. male  with past medical history significant for castrate resistant metastatic prostate cancer (s/p radiation treatments and Lupron), hypertension, hyperlipidemia, neuropathy, Parkinson's, PNA (Aug 2022) and BPH, who was admitted on 09/05/2021 with generalized weakness, back pain, and failure to thrive .   CT of neck reveals left neck base mass. CT of abdomen reveals worsening retroperitoneal adenopathy (increases since last scan August 2022) and multiple sclerotic lesions on bones.   Palliative Medicine was consulted to discuss [place]. Pt is a full code.  Clinical Assessment and Goals of Care: I have reviewed medical records including EPIC notes, labs and imaging, assessed the patient and then met with the patient bedside to discuss diagnosis prognosis, GOC, EOL wishes, disposition and options.  I introduced Palliative Medicine as specialized medical care for people living with serious illness. It focuses on providing relief from the symptoms and stress of a serious illness. The goal is to improve quality of life for both the patient and the family.  We discussed a brief life review of the patient.  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  He also did tree work in the past.  He has been married twice.  His first wife of 9 years died of cancer, the kind of which he cannot recall.  However, his second, wife who is still living, is suffering from the same cancer that his first wife died.  Again he cannot remember what specific type of cancer it is.  He reports he has not lived with his wife for about 6 years now.  He has had 5 children.  He had  a daughter passed from a heart issue at age 89.  He has 4 living sons.  [name] is one of his sons and also his healthcare power of attorney.  As far as functional and nutritional status, the patient reports he was having a hard time at home.  He did not remember what medications he took.  He said he was having a hard time getting around and that he had a health aide who sometimes will come to his house.  He endorses his appetite is not what it used to be but he reports that he does like to eat.  He says he [place] drinks juices instead of eating food.  We discussed patient's current illness and what it means in the larger context of patient's on-going co-morbidities.  He can recall that he has prostate cancer that spread to the rest.  He believes that he sees a cancer doctor [place] treatment to get rid of it.  He also endorses that he knows he needs to go to a skilled nursing facility for rehab habilitation.  He says he is willing to do what he needs to do to make  himself stronger.    Advance directives, concepts specific to code status, artificial feeding and hydration, and rehospitalization were considered and discussed.  I asked if he knew what his CODE STATUS was and he did not.  Described that he is a full code meaning that should his heart stop and his lung ceased to work that we will call a CODE BLUE, attempt to restart his heart with electrical shocks and manual chest compressions, and likely place a breathing tube and connect him to the ventilator.  I shared that Knowing what we know about his current health condition -his metastatic prostate cancer, and Parkinson's-both chronic terminal diseases-I am concerned that CPR would cause him more trauma and harm than good.  I shared that research reflects that few patients with his given comorbidities and age do well or have a meaningful recovery to a high quality of life if intubated.  The patient shared that he enjoys living wants to live as long as he can.   I again reiterated that if he remains a full code is intubated that it may be very challenging to remove him from the ventilator.  I detailed how he was to need long-term ventilation that he have to have a tracheotomy which is converting to from his mouth to his throat and then moving to live skilled nursing facility indefinitely.  He will not be able to live at home or have independence if he has a tracheostomy in place.  Using the teach back patient confirmed understanding of intubation and ventilation.  After discussion of CODE STATUS with his thoughts more.  He said he would like to live as long as possible and would like to have everything done to keep him alive.  He also asked me how much time I thought he had to live with with kind of cancer that he has.  We discussed human mortality.  I shared that no one knows how much time he has but that he has a lot against him.  He says he did not expect to make it to his 54s.  He said he lost most of his friends when they were in their 17s.  He says he is grateful that he is still here and wants to keep fighting and stay alive as long as possible.  I asked him what is important to him. He shared like to get as much physical therapy so that he can move on to a skilled nursing facility and hopefully back home independently.  While the patient states that he has prostate cancer that has spread everywhere I do not think that he has a full understanding of the extent of his disease.  He said he would like to speak with his cancer doctor.  I highlighted that both his cancer doctor, primary doctor, and myself are concerned about making a safe plan for him to be able to receive cancer treatments - if they are available.  He said he had not received any treatment for anything over the last several months.  I commented that he had received antibiotics for pneumonia.  He asked me when he had pneumonia and reminded him of his hospitalization in August of this year.  This is  the first part of our discussion when I became concerned about his ability to recall the past and whether or not he is a valid historian.  He quickly changed the subject and asked if I would get the nurse to bring him a bedpan.  I spoke  with patient's son and reviewed patient's wishes and GOC moving forward. I outlined the difference between Hospice and Palliative services. I recommend that if the patient is being discharged to a SNF then he be followed by Palliative Outpatient services.   We reviewed that we are awaiting PT and oncology's recommendations.    Discussed with patient/family the importance of continued conversation with family and the medical providers regarding overall plan of care and treatment options, ensuring decisions are within the context of the patient's values and GOCs.  I shared that I am off service tomorrow but I will return on Thursday.  Questions and concerns were addressed. The patient was encouraged to call with questions or concerns.   Primary Decision Maker PATIENT  Code Status/Advance Care Planning: Full code  Prognosis:   Unable to determine  Discharge Planning: [place] for rehab with Palliative care service follow-up  Primary Diagnoses: Present on Admission:  Failure to thrive in adult  B12 deficiency  Back pain  Benign prostatic hyperplasia  COPD with acute exacerbation ([place])  Essential hypertension  Depression  Parkinson disease ([place])  Prostate cancer metastatic to bone ([place])  Protein-calorie malnutrition, severe  AKI (acute kidney injury) ([place])   Physical Exam Vitals and nursing note reviewed.  Constitutional:      Appearance: Normal appearance.  HENT:     Head: Normocephalic and atraumatic.     Mouth/Throat:     Mouth: Mucous membranes are dry.  Cardiovascular:     Rate and Rhythm: Normal rate.  Pulmonary:     Effort: Pulmonary effort is normal.  Abdominal:     Palpations: Abdomen is soft.  Skin:    General:  Skin is warm and dry.  Neurological:     Mental Status: He is alert. Mental status is at baseline.  Psychiatric:        Thought Content: Thought content normal.    Vital Signs: BP (!) 131/56 (BP Location: Right Arm)   Pulse (!) 45   Temp 97.9 F (36.6 C) (Oral)   Resp 18   Ht 6' (1.829 m)   Wt 57.9 kg   SpO2 95%   BMI 17.31 kg/m  Pain Scale: 0-10   Pain Score: 0-No pain SpO2: SpO2: 95 % O2 Device:SpO2: 95 % O2 Flow Rate: .   Palliative Assessment/Data: 60%     I discussed this patient's plan of care with patient, patient's nurse.  Thank you for this consult. Palliative medicine will continue to follow and assist holistically.   Time Total: 70 minutes Greater than 50%  of this time was spent counseling and coordinating care related to the above assessment and plan.  Signed by: Jordan Hawks, DNP, FNP-BC Palliative Medicine    Please contact Palliative Medicine Team phone at [phone] for questions and concerns.  For individual provider: See Shea Evans

## 2021-09-07 NOTE — Progress Notes (Addendum)
Did have some PROGRESS NOTE  Jacob [name] BJY:[id number] DOB: 10-29-1934 DOA: 09/05/2021 PCP: [name], MD (Inactive)   LOS: 0 days   Brief narrative: Jacob [name] is a 87 y.o. male with medical history significant for metastatic prostate cancer, hypertension, hyperlipidemia, neuropathy, Parkinson's, BPH, presented to the hospital with complaints of generalized weakness and back pain and failure to thrive.  In the ED, patient was noted to be slightly bradycardic.  Labs were significant for creatinine of 1.36.  Patient was given fentanyl IV fluids and patient was admitted to hospital for further evaluation treatment.  Assessment/Plan:  Principal Problem:   Failure to thrive in adult Active Problems:   Essential hypertension   Benign prostatic hyperplasia   Back pain   B12 deficiency   Prostate cancer metastatic to bone [facility])   Family history of prostate cancer   Parkinson disease ([place])   Depression   COPD with acute exacerbation ([place])   Weakness   Protein-calorie malnutrition, severe   AKI (acute kidney injury) ([place])   Failure to thrive Secondary to metastatic metastatic prostate cancer.  On Ensure supplements.  Palliative care has been consulted for goals of care.  States that he has lost around 15 to 20 pounds.  Patient used to be on Lupron in the past.  Patient was recently admitted to hospital and was discharged home.  Patient was seen by oncology during hospitalization.  Patient is not a good candidate for further treatment as per oncology he does not have a supervised environment.  Patient lives by himself at home and does not have good family support..  Palliative care has been consulted.  Back pain likely metastatic from prostate cancer.  On Percocet and IV morphine.  Discontinue fentanyl.  We will continue to monitor.    MRI of the thoracic and lumbar spine showed T9 superior endplate vertebral compression fractures T10 endplate deformity L4 superior endplate compression  fracture, diffuse osseous metastatic disease in the lumbar and lower thoracic spine and sacrum and pelvis.  Oncology to reach out to radiation oncology for palliative radiation.  Acute kidney injury with volume depletion-  Improved after IV fluid hydration.  Will continue IV fluids for 1 more day.  Creatinine of 0.9.   Elevated troponin Mild elevated troponin but flat.  Likely secondary to acute kidney injury.  No chest pain or shortness of breath.   Essential hypertension Patient was on amlodipine and metoprolol at home.  Was initially on hold.  Will resume from today.   Parkinson's disease-  Continue carbidopa-levodopa    Depression/anxiety-continue amitriptyline.  Peripheral neuropathy-continue Neurontin.  Severe protein calorie malnutrition.  Present on admission.  Consult dietitian.  On Ensure.  We will continue.  Debility, deconditioning,back pain.  Patient from home.  He lives by himself.  Patient's son states that he would like his father to be at rehabilitation place.  Ethics/goals of care.  Spoke with the patient son about metastatic prostate cancer on 09/06/2021.  Patient's son wishes him to go to skilled nursing facility.  Patient lives by himself in poor housing conditions.   DVT prophylaxis: enoxaparin (LOVENOX) injection 40 mg Start: 09/05/21 2200 Place TED hose Start: 09/05/21 1704   Code Status: Full code  Family Communication:  Spoke with patient's son on the phone on 09/06/2021   Status is: Observation  The patient should be moved to inpatient due to ambulatory dysfunction and back pain failure to thrive and possible need for palliative care/placement, unsafe discharge.  Dispo: The patient  is from: Home              Anticipated d/c is to: SNF with possible hospice              Patient currently is not medically stable to d/c.   Difficult to place patient No   Consultants: Palliative  care Oncology  Procedures: None  Anti-infectives:  None  Anti-infectives (From admission, onward)    None      Subjective: Today, patient was seen and examined at bedside.  Still complains of back pain.  Denies any nausea or vomiting.  Has poor oral intake.  States that he has had bowel movements a few times  Objective: Vitals:   09/07/21 0521 09/07/21 0525  BP:    Pulse: (!) 44 89  Resp:    Temp:    SpO2: 92% 99%    Intake/Output Summary (Last 24 hours) at 09/07/2021 1155 Last data filed at 09/06/2021 1544 Gross per 24 hour  Intake 1103.6 ml  Output 300 ml  Net 803.6 ml    Filed Weights   09/05/21 1434 09/05/21 1944  Weight: 58 kg 57.9 kg   Body mass index is 17.31 kg/m.   Physical Exam: General:  Average built, not in obvious distress, communicative, cachectic, frail-appearing elderly male HENT:   No scleral pallor or icterus noted. Oral mucosa is moist.  Chest:  Clear breath sounds.  Diminished breath sounds bilaterally. No crackles or wheezes.  Lower back tenderness on palpation CVS: S1 &S2 heard. No murmur.  Regular rate and rhythm. Abdomen: Soft, nontender, nondistended.  Bowel sounds are heard.   Extremities: No cyanosis, clubbing or edema.  Peripheral pulses are palpable. Psych: Alert, awake and communicative, lacks insight, oriented to place CNS:  No cranial nerve deficits.  Muscle atrophy noted, moves all extremities. Skin: Warm and dry.  No rashes noted.  Data Review: I have personally reviewed the following laboratory data and studies,  CBC: Recent Labs  Lab 09/05/21 1437 09/06/21 0503 09/07/21 0450  WBC 9.5 6.9 5.6  HGB 15.6 13.9 14.3  HCT 48.6 43.8 45.6  MCV 85.1 85.4 85.4  PLT 393 368 414*    Basic Metabolic Panel: Recent Labs  Lab 09/05/21 1437 09/06/21 0503 09/07/21 0450  NA 140 139 140  K 4.4 3.8 3.7  CL 94* 101 101  CO2 32 30 30  GLUCOSE 146* 87 96  BUN 36* 29* 22  CREATININE 1.36* 0.99 0.95  CALCIUM 9.9 8.8* 8.9  MG   --   --  1.6*    Liver Function Tests: Recent Labs  Lab 09/05/21 1437  AST 26  ALT 8  ALKPHOS 124  BILITOT 1.4*  PROT 7.6  ALBUMIN 3.3*    No results for input(s): LIPASE, AMYLASE in the last 168 hours. No results for input(s): AMMONIA in the last 168 hours. Cardiac Enzymes: No results for input(s): CKTOTAL, CKMB, CKMBINDEX, TROPONINI in the last 168 hours. BNP (last 3 results) Recent Labs    07/28/21 1221  BNP 123.8*     ProBNP (last 3 results) No results for input(s): PROBNP in the last 8760 hours.  CBG: No results for input(s): GLUCAP in the last 168 hours. Recent Results (from the past 240 hour(s))  Resp Panel by RT-PCR (Flu A&B, Covid) Nasopharyngeal Swab     Status: None   Collection Time: 09/05/21  3:07 PM   Specimen: Nasopharyngeal Swab; Nasopharyngeal(NP) swabs in vial transport medium  Result Value Ref Range Status   SARS  Coronavirus 2 by RT PCR NEGATIVE NEGATIVE Final    Comment: (NOTE) SARS-CoV-2 target nucleic acids are NOT DETECTED.  The SARS-CoV-2 RNA is generally detectable in upper respiratory specimens during the acute phase of infection. The lowest concentration of SARS-CoV-2 viral copies this assay can detect is 138 copies/mL. A negative result does not preclude SARS-Cov-2 infection and should not be used as the sole basis for treatment or other patient management decisions. A negative result may occur with  improper specimen collection/handling, submission of specimen other than nasopharyngeal swab, presence of viral mutation(s) within the areas targeted by this assay, and inadequate number of viral copies(<138 copies/mL). A negative result must be combined with clinical observations, patient history, and epidemiological information. The expected result is Negative.  Fact Sheet for Patients:  EntrepreneurPulse.com.au  Fact Sheet for Healthcare Providers:  IncredibleEmployment.be  This test is no t yet  approved or cleared by the [place] FDA and  has been authorized for detection and/or diagnosis of SARS-CoV-2 by FDA under an Emergency Use Authorization (EUA). This EUA will remain  in effect (meaning this test can be used) for the duration of the COVID-19 declaration under Section 564(b)(1) of the Act, 21 U.S.C.section 360bbb-3(b)(1), unless the authorization is terminated  or revoked sooner.       Influenza A by PCR NEGATIVE NEGATIVE Final   Influenza B by PCR NEGATIVE NEGATIVE Final    Comment: (NOTE) The Xpert Xpress SARS-CoV-2/FLU/RSV plus assay is intended as an aid in the diagnosis of influenza from Nasopharyngeal swab specimens and should not be used as a sole basis for treatment. Nasal washings and aspirates are unacceptable for Xpert Xpress SARS-CoV-2/FLU/RSV testing.  Fact Sheet for Patients: EntrepreneurPulse.com.au  Fact Sheet for Healthcare Providers: IncredibleEmployment.be  This test is not yet approved or cleared by the [place] FDA and has been authorized for detection and/or diagnosis of SARS-CoV-2 by FDA under an Emergency Use Authorization (EUA). This EUA will remain in effect (meaning this test can be used) for the duration of the COVID-19 declaration under Section 564(b)(1) of the Act, 21 U.S.C. section 360bbb-3(b)(1), unless the authorization is terminated or revoked.  Performed at [facility], [address] [place], [place] [address]       Studies: CT HEAD WO CONTRAST (5MM)  Result Date: 09/05/2021 CLINICAL DATA:  Dizziness. EXAM: CT HEAD WITHOUT CONTRAST TECHNIQUE: Contiguous axial images were obtained from the base of the skull through the vertex without intravenous contrast. COMPARISON:  November 17, 2012. FINDINGS: Brain: No evidence of acute infarction, hemorrhage, hydrocephalus, extra-axial collection or mass lesion/mass effect. Vascular: No hyperdense vessel or unexpected calcification.  Skull: Normal. Negative for fracture or focal lesion. Sinuses/Orbits: No acute finding. Other: None. IMPRESSION: No acute intracranial abnormality seen. Electronically Signed   By: Marijo Conception M.D.   On: 09/05/2021 16:15   CT Angio Chest PE W and/or Wo Contrast  Result Date: 09/05/2021 CLINICAL DATA:  Pt to ER via POV with complaints of decreased appetite/ generalized back pain that started 2-3 weeks ago. Reports when he takes a deep breath his whole back hurts. Has been trying to drink ensures but they make him nauseous. Has been taking OTC pain medications with some relief in back pain, denies injury. History of prostate carcinoma. EXAM: CT ANGIOGRAPHY CHEST CT ABDOMEN AND PELVIS WITH CONTRAST TECHNIQUE: Multidetector CT imaging of the chest was performed using the standard protocol during bolus administration of intravenous contrast. Multiplanar CT image reconstructions and MIPs were obtained to evaluate  the vascular anatomy. Multidetector CT imaging of the abdomen and pelvis was performed using the standard protocol during bolus administration of intravenous contrast. CONTRAST:  77mL OMNIPAQUE IOHEXOL 350 MG/ML SOLN COMPARISON:  CT a chest, 07/28/2021. CT abdomen and pelvis, 06/10/2021. FINDINGS: CTA CHEST FINDINGS Cardiovascular: Pulmonary arteries are well opacified. Mild limitation due to respiratory motion affecting assessment of the smaller segmental branches to the lower lungs. Allowing for this mild limitation, there is no evidence of a pulmonary embolism. Heart is normal in size. No pericardial effusion. Left coronary artery calcifications. Great vessels are normal in caliber. Aorta only minimally opacified. No dissection. Mild atherosclerosis. Mediastinum/Nodes: Oval mass at the left neck base, measuring 4.5 x 3.4 x 3.2 cm, consistent with an enlarged lymph node, similar to the prior CT. No mediastinal or hilar masses or enlarged lymph nodes. Mild distension of the esophagus. Trachea is  unremarkable. Lungs/Pleura: Linear opacities, both lower lobes, right middle lobe and left upper lobe lingula, consistent with atelectasis. Moderate centrilobular emphysema. No evidence of pneumonia or pulmonary edema. No mass or suspicious nodule. No pleural effusion or pneumothorax. Musculoskeletal: Multiple sclerotic bone lesions consistent with metastatic disease, without significant change from the prior CT. Mild compression fracture of T9, new since the prior exam. No other fractures. Review of the MIP images confirms the above findings. CT ABDOMEN and PELVIS FINDINGS Hepatobiliary: Liver normal in size and overall attenuation. Several small low-attenuation liver masses, stable consistent with cysts. Two dependent gallstones. No evidence of acute cholecystitis. No bile duct dilation. Pancreas: Unremarkable. No pancreatic ductal dilatation or surrounding inflammatory changes. Spleen: Normal in size without focal abnormality. Adrenals/Urinary Tract: Adrenal glands not well-defined due to adjacent mass effect from adenopathy and the cystic kidneys. Both kidneys are notably enlarged with numerous cysts, unchanged compared to the prior CT. No solid renal masses. No hydronephrosis. Bladder only mildly distended.  Prostate indents the bladder base. Stomach/Bowel: Rectal wall thickening, 1 cm in thickness. Colon mildly distended with moderate increase in the colonic stool burden. No convincing colonic wall thickening or inflammation. Small bowel is normal in caliber. No convincing wall thickening or inflammation. Bowel assessment is somewhat limited by lack of contrast in lack of peritoneal fat. Stomach is unremarkable. Vascular/Lymphatic: Multiple enlarged retroperitoneal lymph nodes which extend to the aortic hiatus. There is a low-attenuation enlarged lymph node at the aortic hiatus measuring 2.7 cm in short axis. Left periaortic node at the level of the left renal vein measures 3 cm in short axis. Nodes posterior  to the infrarenal abdominal aorta measures 2.4 cm in short axis. Adenopathy has markedly increased when compared to the prior CT. Aortic atherosclerosis.  Dilated left common iliac artery to 1.8 cm. Reproductive: Heterogeneous prostate with ill-defined margins, indenting the bladder base, similar to the prior CT. Other: No ascites or abdominal wall hernia. Musculoskeletal: Multiple sclerotic metastatic lesions similar to the prior CT. Mild compression fracture of L4 with depression of the upper endplate new/increased from the prior CT. No other fractures. Review of the MIP images confirms the above findings. IMPRESSION: CTA CHEST 1. Mildly limited due to respiratory motion. Allowing for this, no evidence of a pulmonary embolism. 2. No acute findings. 3. Linear areas of atelectasis/scarring in the lungs. 4. Emphysema. 5. Aortic atherosclerosis. 6. Multiple sclerotic bone lesions reflecting metastatic disease from prostate carcinoma, stable from the recent prior chest CTA. ABDOMEN AND PELVIS CT 1. No convincing acute abnormality. 2. Bulky retroperitoneal adenopathy which has markedly increased when compared to the prior CT consistent with  metastatic disease from prostate carcinoma. 3. Moderate increase in the colonic stool burden. No evidence of bowel obstruction or inflammation. 4. Multiple sclerotic bone lesions consistent with metastatic disease, stable from the prior abdomen and pelvis CT. 5. Multiple renal cysts consistent with polycystic kidneys, unchanged from the prior CT. 6. Aortic atherosclerosis. Electronically Signed   By: Lajean Manes M.D.   On: 09/05/2021 16:32   MR THORACIC SPINE WO CONTRAST  Result Date: 09/05/2021 CLINICAL DATA:  Mid back pain. Three weeks of severe back pain, history of metastatic prostate cancer. EXAM: MRI THORACIC SPINE WITHOUT CONTRAST TECHNIQUE: Multiplanar, multisequence MR imaging of the thoracic spine was performed. No intravenous contrast was administered. COMPARISON:   CT angiogram chest 09/05/2021. FINDINGS: Mild-to-moderate intermittent motion degradation. Alignment: No significant spondylolisthesis. Thoracic dextrocurvature. Vertebrae: There is multifocal marrow signal abnormality throughout the thoracic spine compatible with diffuse osseous metastatic disease. Mild T9 vertebral compression fracture, (approximately 20% height loss), unchanged as compared to the same-day chest CT of 09/05/2021. Subtle T10 superior endplate deformity with minimal height loss. Vertebral body height is otherwise maintained. Cord: Within the limitations of motion degradation, no spinal cord signal abnormality is identified. Paraspinal and other soft tissues: Trace bilateral pleural effusions. Redemonstrated hepatic cyst. Polycystic kidneys, incompletely imaged. Paraspinal soft tissues unremarkable. Disc levels: Multilevel mild-to-moderate disc degeneration. There are multilevel disc bulges. However, there is no significant focal disc herniation or spinal canal stenosis. No compressive bony neural foraminal narrowing. IMPRESSION: Motion degraded exam. Diffuse osseous metastatic disease throughout the thoracic spine. Mild T9 superior endplate vertebral compression fracture (20% height loss), unchanged as compared to the chest CT performed earlier today. This may reflect a pathologic fracture. Subtle T10 superior endplate deformity with minimal height loss. Thoracic spondylosis, as described. No significant spinal canal stenosis or compressive foraminal narrowing. Electronically Signed   By: Kellie Simmering D.O.   On: 09/05/2021 18:36   MR LUMBAR SPINE WO CONTRAST  Result Date: 09/05/2021 CLINICAL DATA:  Low back pain, cancer suspected. Three week history of severe back pain with history of metastatic prostate cancer. EXAM: MRI LUMBAR SPINE WITHOUT CONTRAST TECHNIQUE: Multiplanar, multisequence MR imaging of the lumbar spine was performed. No intravenous contrast was administered. COMPARISON:  CT  abdomen/pelvis 09/05/2021. FINDINGS: Segmentation: 5 lumbar vertebrae. The caudal most well-formed intervertebral disc space is designated L5-S1. Alignment: Straightening of the expected lumbar lordosis. Trace L3-L4 grade 1 anterolisthesis. Lower lumbar dextrocurvature. Vertebrae: There is multifocal signal abnormality within the lumbar spine, visualized lower thoracic spine, sacrum and pelvis compatible with diffuse osseous metastatic disease. L4 superior endplate compression deformity (30% height loss) unchanged as compared to the CT abdomen/pelvis of 09/05/2021. Vertebral body height is otherwise maintained. Conus medullaris and cauda equina: Conus extends to the T12-L1 level. No signal abnormality within the visualized distal spinal cord. Paraspinal and other soft tissues: Polycystic kidneys and bulky retroperitoneal lymphadenopathy more fully characterized on the prior CT abdomen/pelvis of 09/05/2021. Disc levels: Multilevel disc degeneration. Most notably, there is moderate to moderately advanced disc degeneration at T12-L1, L4-L5 and L5-S1. T12-L1: Disc bulge with endplate spurring. No significant spinal canal or foraminal stenosis. L1-L2: Disc bulge with endplate spurring. No significant spinal canal or foraminal stenosis. L2-L3: Trace grade 1 anterolisthesis. Disc bulge with endplate spurring. Mild facet arthrosis. No significant spinal canal or foraminal stenosis. L3-L4: Trace bony retropulsion at the level of the L4 superior endplate. Disc bulge with endplate spurring. Facet arthrosis. Bilateral subarticular stenosis with potential to affect either descending L4 nerve root. Moderate to moderately  severe central canal stenosis. Mild bilateral neural foraminal narrowing (greater on the right). L4-L5: Disc bulge with endplate spurring. Superimposed broad-based right center to right foraminal disc protrusion. Facet arthrosis. The disc protrusion contributes to right subarticular stenosis, encroaching upon the  descending right L5 nerve root. Mild central canal stenosis. No significant foraminal narrowing. L5-S1: Disc bulge with endplate spurring. Facet arthrosis with ligamentum flavum hypertrophy. Mild bilateral subarticular narrowing with slight crowding of the descending S1 nerve roots. Central canal patent. No significant foraminal stenosis. IMPRESSION: Diffuse osseous metastatic disease within the lumbar spine and visualized lower thoracic spine, sacrum and pelvis. L4 superior endplate compression fracture (30% height loss), unchanged as compared to the CT abdomen/pelvis of 09/05/2021. This may reflect a pathologic fracture. Lumbar spondylosis, as outlined and with findings most notably as follows. At L3-L4, there is multifactorial bilateral subarticular stenosis with potential to affect either descending L4 nerve root. Moderate to moderately advanced central canal stenosis. Mild bilateral neural foraminal narrowing. At L4-L5, there is a broad-based right center to right foraminal disc protrusion contributing to right subarticular stenosis and encroaching upon the descending right L5 nerve root. Mild relative narrowing of the central canal. At L5-S1, there is multifactorial mild bilateral subarticular narrowing with slight crowding of the descending S1 nerve roots. Lower lumbar dextrocurvature. Trace grade 1 anterolisthesis at L3-L4. Polycystic kidneys and bulky retroperitoneal lymphadenopathy more fully characterized on the prior CT abdomen/pelvis. Electronically Signed   By: Kellie Simmering D.O.   On: 09/05/2021 18:29   CT ABDOMEN PELVIS W CONTRAST  Result Date: 09/05/2021 CLINICAL DATA:  Pt to ER via POV with complaints of decreased appetite/ generalized back pain that started 2-3 weeks ago. Reports when he takes a deep breath his whole back hurts. Has been trying to drink ensures but they make him nauseous. Has been taking OTC pain medications with some relief in back pain, denies injury. History of prostate  carcinoma. EXAM: CT ANGIOGRAPHY CHEST CT ABDOMEN AND PELVIS WITH CONTRAST TECHNIQUE: Multidetector CT imaging of the chest was performed using the standard protocol during bolus administration of intravenous contrast. Multiplanar CT image reconstructions and MIPs were obtained to evaluate the vascular anatomy. Multidetector CT imaging of the abdomen and pelvis was performed using the standard protocol during bolus administration of intravenous contrast. CONTRAST:  23mL OMNIPAQUE IOHEXOL 350 MG/ML SOLN COMPARISON:  CT a chest, 07/28/2021. CT abdomen and pelvis, 06/10/2021. FINDINGS: CTA CHEST FINDINGS Cardiovascular: Pulmonary arteries are well opacified. Mild limitation due to respiratory motion affecting assessment of the smaller segmental branches to the lower lungs. Allowing for this mild limitation, there is no evidence of a pulmonary embolism. Heart is normal in size. No pericardial effusion. Left coronary artery calcifications. Great vessels are normal in caliber. Aorta only minimally opacified. No dissection. Mild atherosclerosis. Mediastinum/Nodes: Oval mass at the left neck base, measuring 4.5 x 3.4 x 3.2 cm, consistent with an enlarged lymph node, similar to the prior CT. No mediastinal or hilar masses or enlarged lymph nodes. Mild distension of the esophagus. Trachea is unremarkable. Lungs/Pleura: Linear opacities, both lower lobes, right middle lobe and left upper lobe lingula, consistent with atelectasis. Moderate centrilobular emphysema. No evidence of pneumonia or pulmonary edema. No mass or suspicious nodule. No pleural effusion or pneumothorax. Musculoskeletal: Multiple sclerotic bone lesions consistent with metastatic disease, without significant change from the prior CT. Mild compression fracture of T9, new since the prior exam. No other fractures. Review of the MIP images confirms the above findings. CT ABDOMEN and PELVIS FINDINGS Hepatobiliary:  Liver normal in size and overall attenuation.  Several small low-attenuation liver masses, stable consistent with cysts. Two dependent gallstones. No evidence of acute cholecystitis. No bile duct dilation. Pancreas: Unremarkable. No pancreatic ductal dilatation or surrounding inflammatory changes. Spleen: Normal in size without focal abnormality. Adrenals/Urinary Tract: Adrenal glands not well-defined due to adjacent mass effect from adenopathy and the cystic kidneys. Both kidneys are notably enlarged with numerous cysts, unchanged compared to the prior CT. No solid renal masses. No hydronephrosis. Bladder only mildly distended.  Prostate indents the bladder base. Stomach/Bowel: Rectal wall thickening, 1 cm in thickness. Colon mildly distended with moderate increase in the colonic stool burden. No convincing colonic wall thickening or inflammation. Small bowel is normal in caliber. No convincing wall thickening or inflammation. Bowel assessment is somewhat limited by lack of contrast in lack of peritoneal fat. Stomach is unremarkable. Vascular/Lymphatic: Multiple enlarged retroperitoneal lymph nodes which extend to the aortic hiatus. There is a low-attenuation enlarged lymph node at the aortic hiatus measuring 2.7 cm in short axis. Left periaortic node at the level of the left renal vein measures 3 cm in short axis. Nodes posterior to the infrarenal abdominal aorta measures 2.4 cm in short axis. Adenopathy has markedly increased when compared to the prior CT. Aortic atherosclerosis.  Dilated left common iliac artery to 1.8 cm. Reproductive: Heterogeneous prostate with ill-defined margins, indenting the bladder base, similar to the prior CT. Other: No ascites or abdominal wall hernia. Musculoskeletal: Multiple sclerotic metastatic lesions similar to the prior CT. Mild compression fracture of L4 with depression of the upper endplate new/increased from the prior CT. No other fractures. Review of the MIP images confirms the above findings. IMPRESSION: CTA CHEST 1.  Mildly limited due to respiratory motion. Allowing for this, no evidence of a pulmonary embolism. 2. No acute findings. 3. Linear areas of atelectasis/scarring in the lungs. 4. Emphysema. 5. Aortic atherosclerosis. 6. Multiple sclerotic bone lesions reflecting metastatic disease from prostate carcinoma, stable from the recent prior chest CTA. ABDOMEN AND PELVIS CT 1. No convincing acute abnormality. 2. Bulky retroperitoneal adenopathy which has markedly increased when compared to the prior CT consistent with metastatic disease from prostate carcinoma. 3. Moderate increase in the colonic stool burden. No evidence of bowel obstruction or inflammation. 4. Multiple sclerotic bone lesions consistent with metastatic disease, stable from the prior abdomen and pelvis CT. 5. Multiple renal cysts consistent with polycystic kidneys, unchanged from the prior CT. 6. Aortic atherosclerosis. Electronically Signed   By: Lajean Manes M.D.   On: 09/05/2021 16:32      Flora Lipps, MD  [facility] 09/07/2021  If 7PM-7AM, please contact night-coverage

## 2021-09-08 ENCOUNTER — Encounter: Payer: Self-pay | Admitting: Oncology

## 2021-09-08 ENCOUNTER — Telehealth: Payer: Self-pay | Admitting: Pharmacy Technician

## 2021-09-08 ENCOUNTER — Other Ambulatory Visit (HOSPITAL_COMMUNITY): Payer: Self-pay

## 2021-09-08 DIAGNOSIS — N179 Acute kidney failure, unspecified: Secondary | ICD-10-CM | POA: Diagnosis not present

## 2021-09-08 DIAGNOSIS — J441 Chronic obstructive pulmonary disease with (acute) exacerbation: Secondary | ICD-10-CM | POA: Diagnosis not present

## 2021-09-08 DIAGNOSIS — F32A Depression, unspecified: Secondary | ICD-10-CM

## 2021-09-08 DIAGNOSIS — N400 Enlarged prostate without lower urinary tract symptoms: Secondary | ICD-10-CM

## 2021-09-08 DIAGNOSIS — C61 Malignant neoplasm of prostate: Secondary | ICD-10-CM | POA: Diagnosis not present

## 2021-09-08 LAB — BASIC METABOLIC PANEL
Anion gap: 7 (ref 5–15)
BUN: 16 mg/dL (ref 8–23)
CO2: 29 mmol/L (ref 22–32)
Calcium: 8.8 mg/dL — ABNORMAL LOW (ref 8.9–10.3)
Chloride: 104 mmol/L (ref 98–111)
Creatinine, Ser: 0.91 mg/dL (ref 0.61–1.24)
GFR, Estimated: >60 mL/min (ref >60)
Glucose, Bld: 94 mg/dL (ref 70–99)
Potassium: 3.5 mmol/L (ref 3.5–5.1)
Sodium: 140 mmol/L (ref 135–145)

## 2021-09-08 LAB — MAGNESIUM: Magnesium: 1.7 mg/dL (ref 1.7–2.4)

## 2021-09-08 LAB — CBC
HCT: 46.1 % (ref 39.0–52.0)
Hemoglobin: 14.8 g/dL (ref 13.0–17.0)
MCH: 27.7 pg (ref 26.0–34.0)
MCHC: 32.1 g/dL (ref 30.0–36.0)
MCV: 86.3 fL (ref 80.0–100.0)
Platelets: 409 10*3/uL — ABNORMAL HIGH (ref 150–400)
RBC: 5.34 MIL/uL (ref 4.22–5.81)
RDW: 14.5 % (ref 11.5–15.5)
WBC: 4.8 10*3/uL (ref 4.0–10.5)
nRBC: 0 % (ref 0.0–0.2)

## 2021-09-08 NOTE — Telephone Encounter (Signed)
Oral Oncology Patient Advocate Encounter  Prior Authorization for Jacob [name] has been approved.    PA# [id number] Effective dates: 09/08/21 through 12/11/21  Patients co-pay is $100.00  Oral Oncology Clinic will continue to follow.   [place] Patient [place] Phone [phone] Fax [phone] 09/08/2021 9:33 AM

## 2021-09-08 NOTE — Progress Notes (Signed)
[facility]) Hospital Liaison note:  This patient is currently enrolled in [facility] outpatient-based Palliative Care. Will continue to follow for disposition.  Please call with any outpatient palliative questions or concerns.  Thank you, Lorelee Market, LPN [facility] Liaison [phone]

## 2021-09-08 NOTE — Evaluation (Signed)
Physical Therapy Evaluation Patient Details Name: Jacob [name] MRN: [id number] DOB: Apr 20, 1934 Today's Date: 09/08/2021  History of Present Illness  Jacob [name] is a 87 y.o. male with medical history significant for metastatic prostate cancer, hypertension, hyperlipidemia, neuropathy, Parkinson's, BPH, presents the emergency department for chief concerns of back pain.   Clinical Impression  Patient received in bed, requesting to sit up so that he can eat his breakfast. Patient requires min assist for supine to sit. Min +2 for sit to stand and transferring from bed to recliner. Patient unsteady with standing/walking. Will benefit from using RW in future. Patient will continue to benefit from skilled PT while here to improve functional independence, safety and strengthening.        Recommendations for follow up therapy are one component of a multi-disciplinary discharge planning process, led by the attending physician.  Recommendations may be updated based on patient status, additional functional criteria and insurance authorization.  Follow Up Recommendations SNF;Supervision/Assistance - 24 hour    Equipment Recommendations  Other (comment) (to be determined at next venue)    Recommendations for Other Services       Precautions / Restrictions Precautions Precautions: Fall Restrictions Weight Bearing Restrictions: No      Mobility  Bed Mobility Overal bed mobility: Needs Assistance Bed Mobility: Supine to Sit     Supine to sit: Min assist     General bed mobility comments: requires assist to initiate movement and to bring LEs off bed, assist to raise trunk to seated position with facial grimacing    Transfers Overall transfer level: Needs assistance Equipment used: None Transfers: Sit to/from Stand              Ambulation/Gait Ambulation/Gait assistance: Min assist;+2 physical assistance Gait Distance (Feet): 4 Feet Assistive device: 2 person hand held  assist Gait Pattern/deviations: Step-to pattern;Decreased step length - right;Decreased step length - left;Trunk flexed;Decreased stride length Gait velocity: decreased   General Gait Details: patient able to take a few steps from bed to recliner. He remained in tunk flexed position. Reaching for window sill during transfer. Unsafe. High fall risk  Stairs            Wheelchair Mobility    Modified Rankin (Stroke Patients Only)       Balance Overall balance assessment: Needs assistance Sitting-balance support: Feet supported Sitting balance-Leahy Scale: Fair     Standing balance support: Bilateral upper extremity supported;During functional activity Standing balance-Leahy Scale: Fair Standing balance comment: requires assistance for balance with standing and walking                             Pertinent Vitals/Pain Pain Assessment: Faces Faces Pain Scale: Hurts a little bit Pain Location: Back Pain Descriptors / Indicators: Discomfort Pain Intervention(s): Limited activity within patient's tolerance;Monitored during session;Other (comment) (patient has pain patches on back. Reports his pain is improved since coming in.)    [place] expects to be discharged to:: Private residence Living Arrangements: Alone Available Help at Discharge: Family;Available PRN/intermittently Type of Home: House Home Access: Stairs to enter Entrance Stairs-Rails: None Entrance Stairs-Number of Steps: 2-3 Home Layout: One level Home Equipment: Cane - single point      Prior Function Level of Independence: Independent         Comments: reports he does not use AD at home     Hand Dominance        Extremity/Trunk Assessment  Upper Extremity Assessment Upper Extremity Assessment: Defer to OT evaluation    Lower Extremity Assessment Lower Extremity Assessment: Generalized weakness    Cervical / Trunk Assessment Cervical / Trunk Assessment:  Kyphotic  Communication   Communication: HOH  Cognition Arousal/Alertness: Awake/alert Behavior During Therapy: WFL for tasks assessed/performed Overall Cognitive Status: Within Functional Limits for tasks assessed                                        General Comments      Exercises     Assessment/Plan    PT Assessment Patient needs continued PT services  PT Problem List Decreased strength;Decreased mobility;Decreased activity tolerance;Decreased balance;Pain;Decreased knowledge of precautions;Decreased safety awareness       PT Treatment Interventions Therapeutic activities;Gait training;Therapeutic exercise;Functional mobility training;Balance training;Patient/family education;DME instruction    PT Goals (Current goals can be found in the Care Plan section)  Acute Rehab PT Goals Patient Stated Goal: to eat breakfast PT Goal Formulation: With patient Time For Goal Achievement: 09/21/21 Potential to Achieve Goals: Good    Frequency Min 2X/week   Barriers to discharge Decreased caregiver support      Co-evaluation               AM-PAC PT "6 Clicks" Mobility  Outcome Measure Help needed turning from your back to your side while in a flat bed without using bedrails?: A Little Help needed moving from lying on your back to sitting on the side of a flat bed without using bedrails?: A Little Help needed moving to and from a bed to a chair (including a wheelchair)?: A Lot Help needed standing up from a chair using your arms (e.g., wheelchair or bedside chair)?: A Little Help needed to walk in hospital room?: A Lot Help needed climbing 3-5 steps with a railing? : A Lot 6 Click Score: 15    End of Session Equipment Utilized During Treatment: Gait belt Activity Tolerance: Patient limited by fatigue Patient left: in chair;with call bell/phone within reach;with chair alarm set Nurse Communication: Mobility status PT Visit Diagnosis: Unsteadiness on  feet (R26.81);Other abnormalities of gait and mobility (R26.89);Muscle weakness (generalized) (M62.81);Adult, failure to thrive (R62.7)    Time: 1020-1034 PT Time Calculation (min) (ACUTE ONLY): 14 min   Charges:   PT Evaluation $PT Eval Moderate Complexity: 1 Mod          Maple Odaniel, PT, GCS 09/08/21,11:25 AM

## 2021-09-08 NOTE — Telephone Encounter (Signed)
Oral Oncology Patient Advocate Encounter   Received notification from [employer] that prior authorization for Jacob [name] is required.   PA submitted on CoverMyMeds Key [id number] Status is pending   Oral Oncology Clinic will continue to follow.  [place] Patient [place] Phone [phone] Fax [phone] 09/08/2021 9:12 AM

## 2021-09-08 NOTE — NC FL2 (Signed)
[place] LEVEL OF CARE SCREENING TOOL     IDENTIFICATION  Patient Name: Jacob [name] Birthdate: [birth year] Sex: male Admission Date (Current Location): 09/05/2021  [facility] and [place] Number:  Engineering geologist and Address:  [facility], [address], [place], [place] [address]      Provider Number: [id number]  Attending Physician Name and Address:  Debbe Odea, MD  Relative Name and Phone Number:  Loyed, Wilmes (Son)   [phone] [facility]    Current Level of Care: Hospital Recommended Level of Care: [place] Prior Approval Number:    Date Approved/Denied:   PASRR Number: [id number]  Discharge Plan: SNF    Current Diagnoses: Patient Active Problem List   Diagnosis Date Noted   Palliative care by specialist    Full code status    Failure to thrive in adult 09/05/2021   AKI (acute kidney injury) ([place]) 09/05/2021   Palliative care encounter    Bedbug bite    Protein-calorie malnutrition, severe 07/29/2021   Acute respiratory failure with hypoxia ([place])    Lobar pneumonia ([place])    COPD with acute exacerbation ([place])    Weakness    CAP (community acquired pneumonia) 07/28/2021   Parkinson disease ([place]) 07/28/2021   Depression 07/28/2021   Genetic testing 04/12/2021   Family history of prostate cancer    Family history of breast cancer    Goals of care, counseling/discussion 01/18/2021   Prostate cancer metastatic to bone ([place]) 01/14/2021   Vitiligo 10/04/2019   Vitamin D deficiency 12/28/2018   Right inguinal hernia 06/21/2018   Chronic kidney insufficiency, stage 2 (mild) 02/02/2018   Chronic bronchitis, simple ([place]) 12/28/2016   High risk medication use 12/24/2015   Cigarette smoker 12/24/2015   Essential hypertension 06/24/2014   Benign prostatic hyperplasia 06/24/2014   Back pain 06/24/2014   B12 deficiency 06/24/2014    Orientation RESPIRATION BLADDER Height & Weight     Self, Time,  Situation, Place  Normal Continent Weight: 57.9 kg Height:  6' (182.9 cm)  BEHAVIORAL SYMPTOMS/MOOD NEUROLOGICAL BOWEL NUTRITION STATUS      Continent Diet (Regular)  AMBULATORY STATUS COMMUNICATION OF NEEDS Skin   Extensive Assist (+2) Verbally Normal                       Personal Care Assistance Level of Assistance  Bathing, Feeding, Dressing Bathing Assistance: Limited assistance Feeding assistance: Limited assistance Dressing Assistance: Limited assistance     Functional Limitations Info  Sight, Hearing, Speech Sight Info: Adequate Hearing Info: Impaired Speech Info: Adequate    SPECIAL CARE FACTORS FREQUENCY  PT (By licensed PT), OT (By licensed OT)     PT Frequency: 5x weekly OT Frequency: 5x weekly            Contractures      Additional Factors Info  Code Status, Allergies Code Status Info: Full Allergies Info: No Known Allergies           Current Medications (09/08/2021):  This is the current hospital active medication list Current Facility-Administered Medications  Medication Dose Route Frequency Provider Last Rate Last Admin   acetaminophen (TYLENOL) tablet 650 mg  650 mg Oral Q6H PRN Cox, Amy N, DO   650 mg at 09/07/21 1947   Or   acetaminophen (TYLENOL) suppository 650 mg  650 mg Rectal Q6H PRN Cox, Amy N, DO       amitriptyline (ELAVIL) tablet 10 mg  10 mg Oral  QHS Cox, Amy N, DO   10 mg at 09/07/21 2149   amLODipine (NORVASC) tablet 5 mg  5 mg Oral Daily Pokhrel, Laxman, MD   5 mg at 09/08/21 1119   aspirin EC tablet 81 mg  81 mg Oral Daily Cox, Amy N, DO   81 mg at 09/08/21 1118   carbidopa-levodopa (SINEMET IR) 25-100 MG per tablet immediate release 1 tablet  1 tablet Oral TID Cox, Amy N, DO   1 tablet at 09/08/21 1549   enoxaparin (LOVENOX) injection 40 mg  40 mg Subcutaneous QHS Cox, Amy N, DO   40 mg at 09/07/21 2149   feeding supplement (ENSURE ENLIVE / ENSURE PLUS) liquid 237 mL  237 mL Oral TID BM Cox, Amy N, DO   237 mL at 09/08/21  1549   finasteride (PROSCAR) tablet 5 mg  5 mg Oral Daily Cox, Amy N, DO   5 mg at 09/08/21 1119   gabapentin (NEURONTIN) capsule 100 mg  100 mg Oral BID Cox, Amy N, DO   100 mg at 09/08/21 1118   lidocaine (LIDODERM) 5 % 2 patch  2 patch Transdermal Q24H Cox, Amy N, DO   2 patch at 09/07/21 2150   magnesium oxide (MAG-OX) tablet 400 mg  400 mg Oral BID Pokhrel, Laxman, MD   400 mg at 09/08/21 1118   metoprolol succinate (TOPROL-XL) 24 hr tablet 50 mg  50 mg Oral Daily Pokhrel, Laxman, MD       mirtazapine (REMERON) tablet 7.5 mg  7.5 mg Oral QHS Cox, Amy N, DO   7.5 mg at 09/07/21 2149   morphine 4 MG/ML injection 4 mg  4 mg Intravenous Q3H PRN Pokhrel, Laxman, MD       multivitamin with minerals tablet 1 tablet  1 tablet Oral Daily Pokhrel, Laxman, MD   1 tablet at 09/08/21 1118   ondansetron (ZOFRAN) tablet 4 mg  4 mg Oral Q6H PRN Cox, Amy N, DO       Or   ondansetron (ZOFRAN) injection 4 mg  4 mg Intravenous Q6H PRN Cox, Amy N, DO       oxyCODONE-acetaminophen (PERCOCET/ROXICET) 5-325 MG per tablet 1-2 tablet  1-2 tablet Oral Q6H PRN Pokhrel, Laxman, MD       polyethylene glycol (MIRALAX / GLYCOLAX) packet 17 g  17 g Oral Daily PRN Pokhrel, Laxman, MD       tamsulosin (FLOMAX) capsule 0.4 mg  0.4 mg Oral Daily Cox, Amy N, DO   0.4 mg at 09/08/21 1118   vitamin B-12 (CYANOCOBALAMIN) tablet 500 mcg  500 mcg Oral Daily Cox, Amy N, DO   500 mcg at 09/08/21 1118     Discharge Medications: Please see discharge summary for a list of discharge medications.  Relevant Imaging Results:  Relevant Lab Results:   Additional Information SS# [id number] Has two vaccines p[employer]izer 02/03/20 02/24/20  Pete Pelt, RN

## 2021-09-08 NOTE — Progress Notes (Signed)
PROGRESS NOTE    [name]   OZH:[id number]  DOB: [birth year]  DOA: 09/05/2021 PCP: [name], MD (Inactive)   Brief Narrative:  Jacob [name] is a 87 y.o. male with medical history significant for metastatic prostate cancer, hypertension, hyperlipidemia, neuropathy, Parkinson's, BPH, presented to the hospital with complaints of generalized weakness and back pain and failure to thrive.  In the ED, patient was noted to be slightly bradycardic.  Labs were significant for creatinine of 1.36.  Patient was given fentanyl IV fluids and patient was admitted to hospital for further evaluation treatment.   Subjective: Patient has no complaints.    Assessment & Plan:   Principal Problem:   Prostate cancer metastatic to bone [facility]) with failure to thrive - Patient has very poor functional and nutritional status - Continue palliative care discussions  Active Problems: Back pain - Related to metastasis and compression fractures - Continue pain control  AKI related to volume depletion - Has resolved - We will DC IV fluids  Mildly elevated troponin level - Troponin 58    Essential hypertension -Continue amlodipine and metoprolol    Benign prostatic hyperplasia -Continue Flomax and Proscar    Parkinson's disease - cont Sinement  Depression and anxiety - Continue amitriptyline  Peripheral neuropathy - Continue Neurontin  Severe protein calorie malnutrition - Continue dietary supplements   Time spent in minutes: 35 DVT prophylaxis: enoxaparin (LOVENOX) injection 40 mg Start: 09/05/21 2200 Place TED hose Start: 09/05/21 1704 Code Status: Full code Family Communication:  Level of Care: Level of care: Med-Surg Disposition Plan:  Status is: Inpatient  Remains inpatient appropriate because:Inpatient level of care appropriate due to severity of illness  Dispo: The patient is from: Home              Anticipated d/c is to: SNF              Patient currently is not  medically stable to d/c.   Difficult to place patient No      Consultants:  Palliative care Procedures:    Antimicrobials:  Anti-infectives (From admission, onward)    None        Objective: Vitals:   09/08/21 0824 09/08/21 1110 09/08/21 1112 09/08/21 1127  BP: 140/84  (!) 155/77 (!) 145/97  Pulse: (!) 55 (!) 44 (!) 47 (!) 55  Resp: 19   16  Temp: 98.1 F (36.7 C)   98.2 F (36.8 C)  TempSrc:      SpO2: 95%   94%  Weight:      Height:        Intake/Output Summary (Last 24 hours) at 09/08/2021 1329 Last data filed at 09/08/2021 1119 Gross per 24 hour  Intake 2200.54 ml  Output 1050 ml  Net 1150.54 ml   Filed Weights   09/05/21 1434 09/05/21 1944  Weight: 58 kg 57.9 kg    Examination: General exam: Elderly cachectic appearing man laying in bed-appears comfortable HEENT: PERRLA, oral mucosa moist, no sclera icterus or thrush Respiratory system: Clear to auscultation. Respiratory effort normal. Cardiovascular system: S1 & S2 heard, RRR.   Gastrointestinal system: Abdomen soft, non-tender, nondistended. Normal bowel sounds. Central nervous system: Alert and oriented. No focal neurological deficits. Extremities: No cyanosis, clubbing or edema Skin: No rashes or ulcers Psychiatry:  Mood & affect appropriate.     Data Reviewed: I have personally reviewed following labs and imaging studies  CBC: Recent Labs  Lab 09/05/21 1437 09/06/21 0503 09/07/21 0450 09/08/21 6295  WBC 9.5 6.9 5.6 4.8  HGB 15.6 13.9 14.3 14.8  HCT 48.6 43.8 45.6 46.1  MCV 85.1 85.4 85.4 86.3  PLT 393 368 414* 426*   Basic Metabolic Panel: Recent Labs  Lab 09/05/21 1437 09/06/21 0503 09/07/21 0450 09/08/21 0509  NA 140 139 140 140  K 4.4 3.8 3.7 3.5  CL 94* 101 101 104  CO2 32 30 30 29   GLUCOSE 146* 87 96 94  BUN 36* 29* 22 16  CREATININE 1.36* 0.99 0.95 0.91  CALCIUM 9.9 8.8* 8.9 8.8*  MG  --   --  1.6* 1.7   GFR: Estimated Creatinine Clearance: 48.6 mL/min (by C-G  formula based on SCr of 0.91 mg/dL). Liver Function Tests: Recent Labs  Lab 09/05/21 1437  AST 26  ALT 8  ALKPHOS 124  BILITOT 1.4*  PROT 7.6  ALBUMIN 3.3*   Thyroid Function Tests: Recent Labs    09/05/21 1437  TSH 1.454  FREET4 1.29*   Anemia Panel: Recent Labs    09/06/21 0503  VITAMINB12 3,552*   Urine analysis:    Component Value Date/Time   COLORURINE YELLOW (A) 09/05/2021 2201   APPEARANCEUR CLEAR (A) 09/05/2021 2201   APPEARANCEUR Clear 09/25/2020 1314   LABSPEC 1.044 (H) 09/05/2021 2201   LABSPEC 1.011 11/17/2012 1216   PHURINE 7.0 09/05/2021 2201   GLUCOSEU NEGATIVE 09/05/2021 2201   GLUCOSEU Negative 11/17/2012 1216   HGBUR NEGATIVE 09/05/2021 2201   BILIRUBINUR NEGATIVE 09/05/2021 2201   BILIRUBINUR Negative 09/25/2020 1314   BILIRUBINUR Negative 11/17/2012 1216   KETONESUR NEGATIVE 09/05/2021 2201   PROTEINUR 30 (A) 09/05/2021 2201   NITRITE NEGATIVE 09/05/2021 2201   LEUKOCYTESUR NEGATIVE 09/05/2021 2201   LEUKOCYTESUR Negative 11/17/2012 1216     Radiology Studies: No results found.    Scheduled Meds:  amitriptyline  10 mg Oral QHS   amLODipine  5 mg Oral Daily   aspirin EC  81 mg Oral Daily   carbidopa-levodopa  1 tablet Oral TID   enoxaparin (LOVENOX) injection  40 mg Subcutaneous QHS   feeding supplement  237 mL Oral TID BM   finasteride  5 mg Oral Daily   gabapentin  100 mg Oral BID   lidocaine  2 patch Transdermal Q24H   magnesium oxide  400 mg Oral BID   metoprolol succinate  50 mg Oral Daily   mirtazapine  7.5 mg Oral QHS   multivitamin with minerals  1 tablet Oral Daily   tamsulosin  0.4 mg Oral Daily   vitamin B-12  500 mcg Oral Daily   Continuous Infusions:  sodium chloride 75 mL/hr at 09/08/21 1119     LOS: 1 day      Debbe Odea, MD [facility] Pager: [email or web address] 09/08/2021, 1:29 PM

## 2021-09-08 NOTE — TOC Progression Note (Signed)
Transition of Care [facility]) - Progression Note    Patient Details  Name: Jacob [name] MRN: [id number] Date of Birth: Apr 26, 1934  [facility] CM/SW [place], RN Phone Number: 09/08/2021, 4:16 PM  Clinical Narrative:   Patient and son agree that patient should go to SNF.  Patient's wife is currently in [employer] [facility] and family would like for patient to go to the same facility.  Son does not want to speak to hospice at this point, states they have agreed to further cancer treatments and being followed by Palliative Medicine.  Patient is currently open with [facility] hospice outpatient palliative, who are aware he is here and agree to follow him to facility.    Bed search started, information sent to [facility] for evaluation.  TOC contact information given, TOC to follow to discharge.    Expected Discharge Plan:  (TBD) Barriers to Discharge: Continued Medical Work up  Expected Discharge Plan and Services Expected Discharge Plan:  (TBD) In-house Referral: Hospice / Palliative Care Discharge Planning Services: CM Consult   Living arrangements for the past 2 months: Single Family Home                                       Social Determinants of Health (SDOH) Interventions    Readmission Risk Interventions No flowsheet data found.

## 2021-09-08 NOTE — Evaluation (Signed)
Occupational Therapy Evaluation Patient Details Name: Jacob [name] MRN: [id number] DOB: [birth year] Today's Date: 09/08/2021   History of Present Illness Jacob [name] is a 87 y.o. male with medical history significant for metastatic prostate cancer, hypertension, hyperlipidemia, neuropathy, Parkinson's, BPH, presents the emergency department for chief concerns of back pain.   Clinical Impression   Jacob [name] was seen for OT evaluation this date. Prior to hospital admission, Jacob [name] was living at home, alone in a 1 level home with 2-3 STE. Currently Jacob [name] demonstrates impairments as described below (See OT problem list) which functionally limit his ability to perform ADL/self-care tasks. Jacob [name] currently requires MIN A for bed and functional mobility as well as set-up assist for seated UB ADL management.  Jacob [name] would benefit from skilled OT services to address noted impairments and functional limitations (see below for any additional details) in order to maximize safety and independence while minimizing falls risk and caregiver burden. Upon hospital discharge, recommend STR to maximize Jacob [name] safety and return to PLOF.      Recommendations for follow up therapy are one component of a multi-disciplinary discharge planning process, led by the attending physician.  Recommendations may be updated based on patient status, additional functional criteria and insurance authorization.   Follow Up Recommendations  SNF    Equipment Recommendations   (Defer)    Recommendations for Other Services       Precautions / Restrictions Precautions Precautions: Fall Restrictions Weight Bearing Restrictions: No      Mobility Bed Mobility Overal bed mobility: Needs Assistance Bed Mobility: Supine to Sit     Supine to sit: Min assist;+2 for physical assistance     General bed mobility comments: requires assist to initiate movement and to bring LEs off bed, assist to raise trunk to seated position with facial grimacing     Transfers Overall transfer level: Needs assistance Equipment used: 2 person hand held assist Transfers: Sit to/from Stand;Stand Pivot Transfers Sit to Stand: Min assist;Min guard Stand pivot transfers: Min assist            Balance Overall balance assessment: Needs assistance Sitting-balance support: Feet supported Sitting balance-Leahy Scale: Fair Sitting balance - Comments: Steady static sitting, with BUE support.   Standing balance support: Bilateral upper extremity supported;During functional activity Standing balance-Leahy Scale: Fair Standing balance comment: requires assistance for balance with standing and walking                           ADL either performed or assessed with clinical judgement   ADL Overall ADL's : Needs assistance/impaired                                       General ADL Comments: Jacob [name] requires MIN A +2 for bed mobility, MIN A for SPT to room recliner, set-up/supervision for feeding this date. He is functionally limtied by generalized weakness, decreased activity tolerance, & decreased safety awareness.     Vision Baseline Vision/History: 1 Wears glasses Patient Visual Report: No change from baseline       Perception     Praxis      Pertinent Vitals/Pain Pain Assessment: Faces Faces Pain Scale: Hurts a little bit Pain Location: Back Pain Descriptors / Indicators: Discomfort;Sore Pain Intervention(s): Limited activity within patient's tolerance;Monitored during session;Repositioned     Hand Dominance Right   Extremity/Trunk Assessment Upper Extremity Assessment  Upper Extremity Assessment: Generalized weakness   Lower Extremity Assessment Lower Extremity Assessment: Generalized weakness   Cervical / Trunk Assessment Cervical / Trunk Assessment: Kyphotic   Communication Communication Communication: HOH   Cognition Arousal/Alertness: Awake/alert Behavior During Therapy: WFL for tasks  assessed/performed Overall Cognitive Status: Within Functional Limits for tasks assessed                                     General Comments       Exercises Other Exercises Other Exercises: Jacob [name] educated on role of OT in acute setting, safe use of AE/DME for ADL management, routines modifications for improved safety and functional independence, & falls prevention strategies for home and hospital.   Shoulder Instructions      Home Living Family/patient expects to be discharged to:: Private residence Living Arrangements: Alone Available Help at Discharge: Family;Available PRN/intermittently Type of Home: House Home Access: Stairs to enter [employer] of Steps: 2-3 Entrance Stairs-Rails: None Home Layout: One level               Home Equipment: Cane - single point;Bedside commode   Additional Comments: Per chart, Jacob [name] has had multiple falls in last 6 months.      Prior Functioning/Environment Level of Independence: Independent        Comments: Jacob [name] states he does not use AD at home initially but later in session asks, "Wher's my walker?" he also states he has a BSC that he uses in the home. No family member present to confirm baseline PLOF.        OT Problem List: Decreased strength;Decreased coordination;Decreased range of motion;Decreased activity tolerance;Decreased safety awareness;Impaired balance (sitting and/or standing);Decreased knowledge of use of DME or AE;Pain      OT Treatment/Interventions: Self-care/ADL training;Therapeutic exercise;Therapeutic activities;Energy conservation;DME and/or AE instruction;Patient/family education;Balance training    OT Goals(Current goals can be found in the care plan section) Acute Rehab OT Goals Patient Stated Goal: to eat breakfast OT Goal Formulation: With patient Time For Goal Achievement: 09/22/21 ADL Goals Jacob [name] Will Perform Grooming: sitting;with modified independence Jacob [name] Will Perform Lower Body  Dressing: with min assist;sit to/from stand;with adaptive equipment Jacob [name] Will Transfer to Toilet: bedside commode;with min guard assist;ambulating Jacob [name] Will Perform Toileting - Clothing Manipulation and hygiene: with adaptive equipment;sitting/lateral leans;with supervision;with set-up  OT Frequency: Min 1X/week   Barriers to D/C: Inaccessible home environment;Decreased caregiver support          Co-evaluation Jacob [name]/OT/SLP Co-Evaluation/Treatment: Yes Reason for Co-Treatment: Complexity of the patient's impairments (multi-system involvement);For patient/therapist safety;To address functional/ADL transfers Jacob [name] goals addressed during session: Mobility/safety with mobility;Balance OT goals addressed during session: ADL's and self-care;Proper use of Adaptive equipment and DME      AM-PAC OT "6 Clicks" Daily Activity     Outcome Measure Help from another person eating meals?: A Little Help from another person taking care of personal grooming?: A Little Help from another person toileting, which includes using toliet, bedpan, or urinal?: A Little Help from another person bathing (including washing, rinsing, drying)?: A Little Help from another person to put on and taking off regular upper body clothing?: A Little Help from another person to put on and taking off regular lower body clothing?: A Little 6 Click Score: 18   End of Session Equipment Utilized During Treatment: Gait belt Nurse Communication: Mobility status  Activity Tolerance: Patient tolerated treatment well Patient left: in chair;with call bell/phone within reach;with  chair alarm set  OT Visit Diagnosis: Other abnormalities of gait and mobility (R26.89);Muscle weakness (generalized) (M62.81)                Time: 0052-5910 OT Time Calculation (min): 22 min Charges:  OT General Charges $OT Visit: 1 Visit OT Evaluation $OT Eval Moderate Complexity: 1 Mod  Sira Adsit Roosvelt Maser, M.S., OTR/L Ascom: [phone] 09/08/21, 12:09  PM

## 2021-09-09 DIAGNOSIS — C61 Malignant neoplasm of prostate: Secondary | ICD-10-CM | POA: Diagnosis not present

## 2021-09-09 DIAGNOSIS — C7951 Secondary malignant neoplasm of bone: Secondary | ICD-10-CM | POA: Diagnosis not present

## 2021-09-09 NOTE — Progress Notes (Addendum)
PROGRESS NOTE    Jacob [name]   WNU:[id number]  DOB: 1934-04-05  DOA: 09/05/2021 PCP: [name], MD (Inactive)   Brief Narrative:  Jacob [name] is a 87 y.o. male with medical history significant for metastatic prostate cancer, hypertension, hyperlipidemia, neuropathy, Parkinson's, BPH, presented to the hospital with complaints of generalized weakness and back pain and failure to thrive.  In the ED, patient was noted to be slightly bradycardic.  Labs were significant for creatinine of 1.36.  Patient was given fentanyl IV fluids and patient was admitted to hospital for further evaluation treatment.   Subjective: No complaints. Pain is well controlled with medications    Assessment & Plan:   Principal Problem:   Prostate cancer metastatic to bone [facility]) with failure to thrive - Patient has very poor functional and nutritional status - Continue palliative care discussions  Active Problems: Back pain - Related to metastasis and compression fractures - Continue pain control  Underweight Body mass index is 17.31 kg/m.  AKI related to volume depletion - Has resolved- appears to be drinking better now   Mildly elevated troponin level - Troponin 58    Essential hypertension -Continue amlodipine and metoprolol    Benign prostatic hyperplasia -Continue Flomax and Proscar    Parkinson's disease - cont Sinement  Depression and anxiety - Continue amitriptyline  Peripheral neuropathy - Continue Neurontin  Severe protein calorie malnutrition - Continue dietary supplements   Time spent in minutes: 35 DVT prophylaxis: enoxaparin (LOVENOX) injection 40 mg Start: 09/05/21 2200 Place TED hose Start: 09/05/21 1704 Code Status: Full code Family Communication:  Level of Care: Level of care: Med-Surg Disposition Plan:  Status is: Inpatient  Remains inpatient appropriate because:Inpatient level of care appropriate due to severity of illness  Dispo: The patient is from:  Home              Anticipated d/c is to: SNF              Patient currently is medically stable to d/c.   Difficult to place patient No      Consultants:  Palliative care Procedures:    Antimicrobials:  Anti-infectives (From admission, onward)    None        Objective: Vitals:   09/08/21 2318 09/09/21 0452 09/09/21 0803 09/09/21 1152  BP: 134/81 131/74 131/76 124/66  Pulse: (!) 42 61 (!) 52 86  Resp: 16 18 18 18   Temp: 98 F (36.7 C) 98.2 F (36.8 C) 98 F (36.7 C) 98 F (36.7 C)  TempSrc:   Oral   SpO2: 94% 97% 97% 93%  Weight:      Height:        Intake/Output Summary (Last 24 hours) at 09/09/2021 1312 Last data filed at 09/09/2021 1035 Gross per 24 hour  Intake 385.58 ml  Output 200 ml  Net 185.58 ml    Filed Weights   09/05/21 1434 09/05/21 1944  Weight: 58 kg 57.9 kg    Examination: General exam: Appears comfortable - elderly cachectic appearing male, sitting up on bed eating. HEENT: PERRLA, oral mucosa moist, no sclera icterus or thrush Respiratory system: Clear to auscultation. Respiratory effort normal. Cardiovascular system: S1 & S2 heard, regular rate and rhythm Gastrointestinal system: Abdomen soft, non-tender, nondistended. Normal bowel sounds   Central nervous system: Alert and oriented. No focal neurological deficits. Extremities: No cyanosis, clubbing or edema Skin: No rashes or ulcers Psychiatry:  Mood & affect appropriate.       Data Reviewed:  I have personally reviewed following labs and imaging studies  CBC: Recent Labs  Lab 09/05/21 1437 09/06/21 0503 09/07/21 0450 09/08/21 0509  WBC 9.5 6.9 5.6 4.8  HGB 15.6 13.9 14.3 14.8  HCT 48.6 43.8 45.6 46.1  MCV 85.1 85.4 85.4 86.3  PLT 393 368 414* 409*    Basic Metabolic Panel: Recent Labs  Lab 09/05/21 1437 09/06/21 0503 09/07/21 0450 09/08/21 0509  NA 140 139 140 140  K 4.4 3.8 3.7 3.5  CL 94* 101 101 104  CO2 32 30 30 29   GLUCOSE 146* 87 96 94  BUN 36* 29* 22  16  CREATININE 1.36* 0.99 0.95 0.91  CALCIUM 9.9 8.8* 8.9 8.8*  MG  --   --  1.6* 1.7    GFR: Estimated Creatinine Clearance: 48.6 mL/min (by C-G formula based on SCr of 0.91 mg/dL). Liver Function Tests: Recent Labs  Lab 09/05/21 1437  AST 26  ALT 8  ALKPHOS 124  BILITOT 1.4*  PROT 7.6  ALBUMIN 3.3*    Thyroid Function Tests: No results for input(s): TSH, T4TOTAL, FREET4, T3FREE, THYROIDAB in the last 72 hours.  Anemia Panel: No results for input(s): VITAMINB12, FOLATE, FERRITIN, TIBC, IRON, RETICCTPCT in the last 72 hours.  Urine analysis:    Component Value Date/Time   COLORURINE YELLOW (A) 09/05/2021 2201   APPEARANCEUR CLEAR (A) 09/05/2021 2201   APPEARANCEUR Clear 09/25/2020 1314   LABSPEC 1.044 (H) 09/05/2021 2201   LABSPEC 1.011 11/17/2012 1216   PHURINE 7.0 09/05/2021 2201   GLUCOSEU NEGATIVE 09/05/2021 2201   GLUCOSEU Negative 11/17/2012 1216   HGBUR NEGATIVE 09/05/2021 2201   BILIRUBINUR NEGATIVE 09/05/2021 2201   BILIRUBINUR Negative 09/25/2020 1314   BILIRUBINUR Negative 11/17/2012 1216   KETONESUR NEGATIVE 09/05/2021 2201   PROTEINUR 30 (A) 09/05/2021 2201   NITRITE NEGATIVE 09/05/2021 2201   LEUKOCYTESUR NEGATIVE 09/05/2021 2201   LEUKOCYTESUR Negative 11/17/2012 1216     Radiology Studies: No results found.    Scheduled Meds:  amitriptyline  10 mg Oral QHS   amLODipine  5 mg Oral Daily   aspirin EC  81 mg Oral Daily   carbidopa-levodopa  1 tablet Oral TID   enoxaparin (LOVENOX) injection  40 mg Subcutaneous QHS   feeding supplement  237 mL Oral TID BM   finasteride  5 mg Oral Daily   gabapentin  100 mg Oral BID   lidocaine  2 patch Transdermal Q24H   magnesium oxide  400 mg Oral BID   metoprolol succinate  50 mg Oral Daily   mirtazapine  7.5 mg Oral QHS   multivitamin with minerals  1 tablet Oral Daily   tamsulosin  0.4 mg Oral Daily   vitamin B-12  500 mcg Oral Daily   Continuous Infusions:     LOS: 2 days      Debbe Odea, MD [facility] Pager: [email or web address] 09/09/2021, 1:12 PM

## 2021-09-09 NOTE — TOC Progression Note (Signed)
Transition of Care [facility]) - Progression Note    Patient Details  Name: Jacob [name] MRN: [id number] Date of Birth: 1934/03/07  Transition of Care [facility]) CM/SW [place], RN Phone Number: 09/09/2021, 10:30 AM  Clinical Narrative:   Patient and son requested that patient be transferred to [facility] care, where patient's wife resides.  Tonya at [facility] notified.  [facility] can accept patient.  Will need auth and discussion about chemo medication in facility.  Will follow up with patient and son today, and apply for auth.  TOC to follow     Expected Discharge Plan:  (TBD) Barriers to Discharge: Continued Medical Work up  Expected Discharge Plan and Services Expected Discharge Plan:  (TBD) In-house Referral: Hospice / Palliative Care Discharge Planning Services: CM Consult   Living arrangements for the past 2 months: Single Family Home                                       Social Determinants of Health (SDOH) Interventions    Readmission Risk Interventions No flowsheet data found.

## 2021-09-09 NOTE — Progress Notes (Signed)
Occupational Therapy Treatment Patient Details Name: Jacob [name] MRN: [id number] DOB: 15-Nov-1934 Today's Date: 09/09/2021   History of present illness Jacob [name] is a 87 y.o. male with medical history significant for metastatic prostate cancer, hypertension, hyperlipidemia, neuropathy, Parkinson's, BPH, presents the emergency department for chief concerns of back pain.   OT comments  Mr. Krichbaum seen for OT treatment on this date. Upon arrival to room pt standing having completed bowel movement. CGA for BSC transfer and perihygiene in standing- MIN A for thoroughness. Pt agreeable to tx. Pt completed Therex as below. Pt instructed in HEP and falls prcns.   Pt verbalized understanding of instruction provided. Pt making good progress toward goals. Pt continues to benefit from skilled OT services to maximize return to PLOF and minimize risk of future falls, injury, caregiver burden, and readmission. Will continue to follow POC. Discharge recommendation remains appropriate.     Recommendations for follow up therapy are one component of a multi-disciplinary discharge planning process, led by the attending physician.  Recommendations may be updated based on patient status, additional functional criteria and insurance authorization.    Follow Up Recommendations  SNF    Equipment Recommendations   (Defer)    Recommendations for Other Services      Precautions / Restrictions Precautions Precautions: Fall Restrictions Weight Bearing Restrictions: No       Mobility Bed Mobility               General bed mobility comments: Not tested    Transfers Overall transfer level: Needs assistance Equipment used: None Transfers: Sit to/from Stand;Stand Pivot Transfers Sit to Stand: Min guard Stand pivot transfers: Min assist       General transfer comment: Needs VC for safety    Balance Overall balance assessment: Needs assistance Sitting-balance support: Feet supported Sitting  balance-Leahy Scale: Good     Standing balance support: No upper extremity supported;During functional activity Standing balance-Leahy Scale: Fair Standing balance comment: CGA required due to poor safety, patient standing with head between legs for perihygiene                           ADL either performed or assessed with clinical judgement   ADL Overall ADL's : Needs assistance/impaired                                       General ADL Comments: CGA for BSC transfer and perihygiene in standing- MIN A for thoroughness.     Cognition Arousal/Alertness: Awake/alert Behavior During Therapy: WFL for tasks assessed/performed Overall Cognitive Status: Within Functional Limits for tasks assessed                                          Exercises Exercises: Other exercises;General Upper Extremity;General Lower Extremity General Exercises - Upper Extremity Chair Push Up: 5 reps;Strengthening;Both;Seated General Exercises - Lower Extremity Heel Slides: Strengthening;Both;5 reps;Seated Hip ABduction/ADduction: Strengthening;Both;5 reps;Seated Toe Raises: Both;5 reps;Standing;Strengthening Mini-Sqauts: Strengthening;5 reps;Standing;Both Other Exercises Other Exercises: Pt educated re: HEP, falls pcns Other Exercises: Perihygiene, [place] transfer, UB therex strengthening, LB therex strengthening      General Comments      Pertinent Vitals/ Pain       Pain Assessment: No/denies pain   Frequency  Min 1X/week  Progress Toward Goals  OT Goals(current goals can now be found in the care plan section)     Acute Rehab OT Goals Patient Stated Goal: To get better and exercise OT Goal Formulation: With patient Time For Goal Achievement: 09/22/21 ADL Goals Pt Will Perform Grooming: sitting;with modified independence Pt Will Perform Lower Body Dressing: with min assist;sit to/from stand;with adaptive equipment Pt Will Transfer to  Toilet: bedside commode;with min guard assist;ambulating Pt Will Perform Toileting - Clothing Manipulation and hygiene: with adaptive equipment;sitting/lateral leans;with supervision;with set-up  Plan Discharge plan remains appropriate    AM-PAC OT "6 Clicks" Daily Activity     Outcome Measure   Help from another person eating meals?: A Little Help from another person taking care of personal grooming?: A Little Help from another person toileting, which includes using toliet, bedpan, or urinal?: A Little Help from another person bathing (including washing, rinsing, drying)?: A Little Help from another person to put on and taking off regular upper body clothing?: A Little Help from another person to put on and taking off regular lower body clothing?: A Little 6 Click Score: 18    End of Session Equipment Utilized During Treatment: Rolling walker  OT Visit Diagnosis: Other abnormalities of gait and mobility (R26.89);Muscle weakness (generalized) (M62.81)   Activity Tolerance Patient tolerated treatment well   Patient Left in chair;with call bell/phone within reach;with chair alarm set   Nurse Communication          Time: 1434-1450 OT Time Calculation (min): 16 min  Charges: OT General Charges $OT Visit: 1 Visit OT Treatments $Self Care/Home Management : 8-22 mins  Nino Glow, Markus Daft 09/09/2021, 4:10 PM

## 2021-09-09 NOTE — Progress Notes (Signed)
Hematology/Oncology Consult note [facility]  Telephone:([phone] [phone]  Patient Care Team: [name], MD (Inactive) as PCP - General (Internal Medicine) Clent Jacks, RN as Oncology Nurse Navigator   Name of the patient: Jacob [name]  [id number]  Jun 27, 1934   Date of visit: 09/08/21    Interval history-he is sitting up in his chair and eating his evening snack.  Reports feeling better overall.  States that his appetite and energy levels have improved since he has come to the hospital.  Back pain is currently well controlled.  ECOG PS- 3 Pain scale- 4 Opioid associated constipation- no  Review of systems- Review of Systems  Constitutional:  Positive for malaise/fatigue and weight loss. Negative for chills and fever.  HENT:  Negative for congestion, ear discharge and nosebleeds.   Eyes:  Negative for blurred vision.  Respiratory:  Negative for cough, hemoptysis, sputum production, shortness of breath and wheezing.   Cardiovascular:  Negative for chest pain, palpitations, orthopnea and claudication.  Gastrointestinal:  Negative for abdominal pain, blood in stool, constipation, diarrhea, heartburn, melena, nausea and vomiting.  Genitourinary:  Negative for dysuria, flank pain, frequency, hematuria and urgency.  Musculoskeletal:  Positive for back pain. Negative for joint pain and myalgias.  Skin:  Negative for rash.  Neurological:  Negative for dizziness, tingling, focal weakness, seizures, weakness and headaches.  Endo/Heme/Allergies:  Does not bruise/bleed easily.  Psychiatric/Behavioral:  Negative for depression and suicidal ideas. The patient does not have insomnia.       No Known Allergies   Past Medical History:  Diagnosis Date   BPH (benign prostatic hyperplasia)    Cancer (HCC)    Chronic constipation 06/25/2015   COPD (chronic obstructive pulmonary disease) ([place])    pt denies.   Family history of breast cancer     Family history of prostate cancer    Hypertension    Swelling of lower extremity    bilateral   Wears dentures    full upper and lower - do not fit well.     Past Surgical History:  Procedure Laterality Date   CATARACT EXTRACTION W/PHACO Right 09/30/2015   Procedure: CATARACT EXTRACTION PHACO AND INTRAOCULAR LENS PLACEMENT (IOC);  Surgeon: Leandrew Koyanagi, MD;  Location: [place];  Service: Ophthalmology;  Laterality: Right;   COLONOSCOPY  2009?   COLONOSCOPY WITH PROPOFOL N/A 01/06/2021   Procedure: COLONOSCOPY WITH PROPOFOL;  Surgeon: Lin Landsman, MD;  Location: [facility] ENDOSCOPY;  Service: Gastroenterology;  Laterality: N/A;   HERNIA REPAIR Right    x2     Social History   Socioeconomic History   Marital status: Married    Spouse name: Not on file   Number of children: Not on file   Years of education: Not on file   Highest education level: Not on file  Occupational History   Not on file  Tobacco Use   Smoking status: Every Day    Packs/day: 0.25    Years: 45.00    Pack years: 11.25    Types: Cigarettes   Smokeless tobacco: Never  Vaping Use   Vaping Use: Never used  Substance and Sexual Activity   Alcohol use: Yes    Comment: drink beer maybe 2 - 3 weeks   Drug use: No   Sexual activity: Not on file  Other Topics Concern   Not on file  Social History Narrative   Not on file   Social Determinants of Health   Financial  Resource Strain: Not on file  Food Insecurity: Not on file  Transportation Needs: Not on file  Physical Activity: Not on file  Stress: Not on file  Social Connections: Not on file  Intimate Partner Violence: Not on file    Family History  Problem Relation Age of Onset   Prostate cancer Brother    Alzheimer's disease Mother    Breast cancer Mother        dx 40 or 66   Stroke Father    Heart attack Daughter    Kidney cancer Neg Hx    Kidney disease Neg Hx    Bladder Cancer Neg Hx      Current  Facility-Administered Medications:    amitriptyline (ELAVIL) tablet 10 mg, 10 mg, Oral, QHS, Cox, Amy N, DO, 10 mg at 09/08/21 2033   amLODipine (NORVASC) tablet 5 mg, 5 mg, Oral, Daily, Pokhrel, Laxman, MD, 5 mg at 09/08/21 1119   aspirin EC tablet 81 mg, 81 mg, Oral, Daily, Cox, Amy N, DO, 81 mg at 09/08/21 1118   carbidopa-levodopa (SINEMET IR) 25-100 MG per tablet immediate release 1 tablet, 1 tablet, Oral, TID, Cox, Amy N, DO, 1 tablet at 09/08/21 2030   enoxaparin (LOVENOX) injection 40 mg, 40 mg, Subcutaneous, QHS, Cox, Amy N, DO, 40 mg at 09/08/21 2029   feeding supplement (ENSURE ENLIVE / ENSURE PLUS) liquid 237 mL, 237 mL, Oral, TID BM, Cox, Amy N, DO, 237 mL at 09/08/21 1549   finasteride (PROSCAR) tablet 5 mg, 5 mg, Oral, Daily, Cox, Amy N, DO, 5 mg at 09/08/21 1119   gabapentin (NEURONTIN) capsule 100 mg, 100 mg, Oral, BID, Cox, Amy N, DO, 100 mg at 09/08/21 2030   lidocaine (LIDODERM) 5 % 2 patch, 2 patch, Transdermal, Q24H, Cox, Amy N, DO, 2 patch at 09/08/21 2316   magnesium oxide (MAG-OX) tablet 400 mg, 400 mg, Oral, BID, Pokhrel, Laxman, MD, 400 mg at 09/08/21 2030   metoprolol succinate (TOPROL-XL) 24 hr tablet 50 mg, 50 mg, Oral, Daily, Pokhrel, Laxman, MD   mirtazapine (REMERON) tablet 7.5 mg, 7.5 mg, Oral, QHS, Cox, Amy N, DO, 7.5 mg at 09/08/21 2030   morphine 4 MG/ML injection 4 mg, 4 mg, Intravenous, Q3H PRN, Pokhrel, Laxman, MD   multivitamin with minerals tablet 1 tablet, 1 tablet, Oral, Daily, Pokhrel, Laxman, MD, 1 tablet at 09/08/21 1118   ondansetron (ZOFRAN) tablet 4 mg, 4 mg, Oral, Q6H PRN **OR** ondansetron (ZOFRAN) injection 4 mg, 4 mg, Intravenous, Q6H PRN, Cox, Amy N, DO   oxyCODONE-acetaminophen (PERCOCET/ROXICET) 5-325 MG per tablet 1-2 tablet, 1-2 tablet, Oral, Q6H PRN, Pokhrel, Laxman, MD   polyethylene glycol (MIRALAX / GLYCOLAX) packet 17 g, 17 g, Oral, Daily PRN, Pokhrel, Laxman, MD   tamsulosin (FLOMAX) capsule 0.4 mg, 0.4 mg, Oral, Daily, Cox, Amy N,  DO, 0.4 mg at 09/08/21 1118   vitamin B-12 (CYANOCOBALAMIN) tablet 500 mcg, 500 mcg, Oral, Daily, Cox, Amy N, DO, 500 mcg at 09/08/21 1118  Physical exam:  Vitals:   09/08/21 1649 09/08/21 2011 09/08/21 2318 09/09/21 0452  BP: 128/75 111/75 134/81 131/74  Pulse: 65 91 (!) 42 61  Resp: 17 18 16 18   Temp: 98.1 F (36.7 C) 98 F (36.7 C) 98 F (36.7 C) 98.2 F (36.8 C)  TempSrc:      SpO2: 93% 96% 94% 97%  Weight:      Height:       Physical Exam Constitutional:      Comments: Patient is  thin and cachectic.  Appears in no acute distress  Cardiovascular:     Rate and Rhythm: Normal rate and regular rhythm.     Heart sounds: Normal heart sounds.  Pulmonary:     Effort: Pulmonary effort is normal.     Breath sounds: Normal breath sounds.  Abdominal:     General: Bowel sounds are normal.     Palpations: Abdomen is soft.  Skin:    General: Skin is warm and dry.  Neurological:     Mental Status: He is alert and oriented to person, place, and time.     CMP Latest Ref Rng & Units 09/08/2021  Glucose 70 - 99 mg/dL 94  BUN 8 - 23 mg/dL 16  Creatinine 0.61 - 1.24 mg/dL 0.91  Sodium 135 - 145 mmol/L 140  Potassium 3.5 - 5.1 mmol/L 3.5  Chloride 98 - 111 mmol/L 104  CO2 22 - 32 mmol/L 29  Calcium 8.9 - 10.3 mg/dL 8.8(L)  Total Protein 6.5 - 8.1 g/dL -  Total Bilirubin 0.3 - 1.2 mg/dL -  Alkaline Phos 38 - 126 U/L -  AST 15 - 41 U/L -  ALT 0 - 44 U/L -   CBC Latest Ref Rng & Units 09/08/2021  WBC 4.0 - 10.5 K/uL 4.8  Hemoglobin 13.0 - 17.0 g/dL 14.8  Hematocrit 39.0 - 52.0 % 46.1  Platelets 150 - 400 K/uL 409(H)    @IMAGES @  CT HEAD WO CONTRAST (5MM)  Result Date: 09/05/2021 CLINICAL DATA:  Dizziness. EXAM: CT HEAD WITHOUT CONTRAST TECHNIQUE: Contiguous axial images were obtained from the base of the skull through the vertex without intravenous contrast. COMPARISON:  November 17, 2012. FINDINGS: Brain: No evidence of acute infarction, hemorrhage, hydrocephalus, extra-axial  collection or mass lesion/mass effect. Vascular: No hyperdense vessel or unexpected calcification. Skull: Normal. Negative for fracture or focal lesion. Sinuses/Orbits: No acute finding. Other: None. IMPRESSION: No acute intracranial abnormality seen. Electronically Signed   By: Marijo Conception M.D.   On: 09/05/2021 16:15   CT Angio Chest PE W and/or Wo Contrast  Result Date: 09/05/2021 CLINICAL DATA:  Pt to ER via POV with complaints of decreased appetite/ generalized back pain that started 2-3 weeks ago. Reports when he takes a deep breath his whole back hurts. Has been trying to drink ensures but they make him nauseous. Has been taking OTC pain medications with some relief in back pain, denies injury. History of prostate carcinoma. EXAM: CT ANGIOGRAPHY CHEST CT ABDOMEN AND PELVIS WITH CONTRAST TECHNIQUE: Multidetector CT imaging of the chest was performed using the standard protocol during bolus administration of intravenous contrast. Multiplanar CT image reconstructions and MIPs were obtained to evaluate the vascular anatomy. Multidetector CT imaging of the abdomen and pelvis was performed using the standard protocol during bolus administration of intravenous contrast. CONTRAST:  57mL OMNIPAQUE IOHEXOL 350 MG/ML SOLN COMPARISON:  CT a chest, 07/28/2021. CT abdomen and pelvis, 06/10/2021. FINDINGS: CTA CHEST FINDINGS Cardiovascular: Pulmonary arteries are well opacified. Mild limitation due to respiratory motion affecting assessment of the smaller segmental branches to the lower lungs. Allowing for this mild limitation, there is no evidence of a pulmonary embolism. Heart is normal in size. No pericardial effusion. Left coronary artery calcifications. Great vessels are normal in caliber. Aorta only minimally opacified. No dissection. Mild atherosclerosis. Mediastinum/Nodes: Oval mass at the left neck base, measuring 4.5 x 3.4 x 3.2 cm, consistent with an enlarged lymph node, similar to the prior CT. No  mediastinal or hilar masses  or enlarged lymph nodes. Mild distension of the esophagus. Trachea is unremarkable. Lungs/Pleura: Linear opacities, both lower lobes, right middle lobe and left upper lobe lingula, consistent with atelectasis. Moderate centrilobular emphysema. No evidence of pneumonia or pulmonary edema. No mass or suspicious nodule. No pleural effusion or pneumothorax. Musculoskeletal: Multiple sclerotic bone lesions consistent with metastatic disease, without significant change from the prior CT. Mild compression fracture of T9, new since the prior exam. No other fractures. Review of the MIP images confirms the above findings. CT ABDOMEN and PELVIS FINDINGS Hepatobiliary: Liver normal in size and overall attenuation. Several small low-attenuation liver masses, stable consistent with cysts. Two dependent gallstones. No evidence of acute cholecystitis. No bile duct dilation. Pancreas: Unremarkable. No pancreatic ductal dilatation or surrounding inflammatory changes. Spleen: Normal in size without focal abnormality. Adrenals/Urinary Tract: Adrenal glands not well-defined due to adjacent mass effect from adenopathy and the cystic kidneys. Both kidneys are notably enlarged with numerous cysts, unchanged compared to the prior CT. No solid renal masses. No hydronephrosis. Bladder only mildly distended.  Prostate indents the bladder base. Stomach/Bowel: Rectal wall thickening, 1 cm in thickness. Colon mildly distended with moderate increase in the colonic stool burden. No convincing colonic wall thickening or inflammation. Small bowel is normal in caliber. No convincing wall thickening or inflammation. Bowel assessment is somewhat limited by lack of contrast in lack of peritoneal fat. Stomach is unremarkable. Vascular/Lymphatic: Multiple enlarged retroperitoneal lymph nodes which extend to the aortic hiatus. There is a low-attenuation enlarged lymph node at the aortic hiatus measuring 2.7 cm in short axis. Left  periaortic node at the level of the left renal vein measures 3 cm in short axis. Nodes posterior to the infrarenal abdominal aorta measures 2.4 cm in short axis. Adenopathy has markedly increased when compared to the prior CT. Aortic atherosclerosis.  Dilated left common iliac artery to 1.8 cm. Reproductive: Heterogeneous prostate with ill-defined margins, indenting the bladder base, similar to the prior CT. Other: No ascites or abdominal wall hernia. Musculoskeletal: Multiple sclerotic metastatic lesions similar to the prior CT. Mild compression fracture of L4 with depression of the upper endplate new/increased from the prior CT. No other fractures. Review of the MIP images confirms the above findings. IMPRESSION: CTA CHEST 1. Mildly limited due to respiratory motion. Allowing for this, no evidence of a pulmonary embolism. 2. No acute findings. 3. Linear areas of atelectasis/scarring in the lungs. 4. Emphysema. 5. Aortic atherosclerosis. 6. Multiple sclerotic bone lesions reflecting metastatic disease from prostate carcinoma, stable from the recent prior chest CTA. ABDOMEN AND PELVIS CT 1. No convincing acute abnormality. 2. Bulky retroperitoneal adenopathy which has markedly increased when compared to the prior CT consistent with metastatic disease from prostate carcinoma. 3. Moderate increase in the colonic stool burden. No evidence of bowel obstruction or inflammation. 4. Multiple sclerotic bone lesions consistent with metastatic disease, stable from the prior abdomen and pelvis CT. 5. Multiple renal cysts consistent with polycystic kidneys, unchanged from the prior CT. 6. Aortic atherosclerosis. Electronically Signed   By: Lajean Manes M.D.   On: 09/05/2021 16:32   MR THORACIC SPINE WO CONTRAST  Result Date: 09/05/2021 CLINICAL DATA:  Mid back pain. Three weeks of severe back pain, history of metastatic prostate cancer. EXAM: MRI THORACIC SPINE WITHOUT CONTRAST TECHNIQUE: Multiplanar, multisequence MR  imaging of the thoracic spine was performed. No intravenous contrast was administered. COMPARISON:  CT angiogram chest 09/05/2021. FINDINGS: Mild-to-moderate intermittent motion degradation. Alignment: No significant spondylolisthesis. Thoracic dextrocurvature. Vertebrae: There is multifocal marrow  signal abnormality throughout the thoracic spine compatible with diffuse osseous metastatic disease. Mild T9 vertebral compression fracture, (approximately 20% height loss), unchanged as compared to the same-day chest CT of 09/05/2021. Subtle T10 superior endplate deformity with minimal height loss. Vertebral body height is otherwise maintained. Cord: Within the limitations of motion degradation, no spinal cord signal abnormality is identified. Paraspinal and other soft tissues: Trace bilateral pleural effusions. Redemonstrated hepatic cyst. Polycystic kidneys, incompletely imaged. Paraspinal soft tissues unremarkable. Disc levels: Multilevel mild-to-moderate disc degeneration. There are multilevel disc bulges. However, there is no significant focal disc herniation or spinal canal stenosis. No compressive bony neural foraminal narrowing. IMPRESSION: Motion degraded exam. Diffuse osseous metastatic disease throughout the thoracic spine. Mild T9 superior endplate vertebral compression fracture (20% height loss), unchanged as compared to the chest CT performed earlier today. This may reflect a pathologic fracture. Subtle T10 superior endplate deformity with minimal height loss. Thoracic spondylosis, as described. No significant spinal canal stenosis or compressive foraminal narrowing. Electronically Signed   By: Kellie Simmering D.O.   On: 09/05/2021 18:36   MR LUMBAR SPINE WO CONTRAST  Result Date: 09/05/2021 CLINICAL DATA:  Low back pain, cancer suspected. Three week history of severe back pain with history of metastatic prostate cancer. EXAM: MRI LUMBAR SPINE WITHOUT CONTRAST TECHNIQUE: Multiplanar, multisequence MR  imaging of the lumbar spine was performed. No intravenous contrast was administered. COMPARISON:  CT abdomen/pelvis 09/05/2021. FINDINGS: Segmentation: 5 lumbar vertebrae. The caudal most well-formed intervertebral disc space is designated L5-S1. Alignment: Straightening of the expected lumbar lordosis. Trace L3-L4 grade 1 anterolisthesis. Lower lumbar dextrocurvature. Vertebrae: There is multifocal signal abnormality within the lumbar spine, visualized lower thoracic spine, sacrum and pelvis compatible with diffuse osseous metastatic disease. L4 superior endplate compression deformity (30% height loss) unchanged as compared to the CT abdomen/pelvis of 09/05/2021. Vertebral body height is otherwise maintained. Conus medullaris and cauda equina: Conus extends to the T12-L1 level. No signal abnormality within the visualized distal spinal cord. Paraspinal and other soft tissues: Polycystic kidneys and bulky retroperitoneal lymphadenopathy more fully characterized on the prior CT abdomen/pelvis of 09/05/2021. Disc levels: Multilevel disc degeneration. Most notably, there is moderate to moderately advanced disc degeneration at T12-L1, L4-L5 and L5-S1. T12-L1: Disc bulge with endplate spurring. No significant spinal canal or foraminal stenosis. L1-L2: Disc bulge with endplate spurring. No significant spinal canal or foraminal stenosis. L2-L3: Trace grade 1 anterolisthesis. Disc bulge with endplate spurring. Mild facet arthrosis. No significant spinal canal or foraminal stenosis. L3-L4: Trace bony retropulsion at the level of the L4 superior endplate. Disc bulge with endplate spurring. Facet arthrosis. Bilateral subarticular stenosis with potential to affect either descending L4 nerve root. Moderate to moderately severe central canal stenosis. Mild bilateral neural foraminal narrowing (greater on the right). L4-L5: Disc bulge with endplate spurring. Superimposed broad-based right center to right foraminal disc protrusion.  Facet arthrosis. The disc protrusion contributes to right subarticular stenosis, encroaching upon the descending right L5 nerve root. Mild central canal stenosis. No significant foraminal narrowing. L5-S1: Disc bulge with endplate spurring. Facet arthrosis with ligamentum flavum hypertrophy. Mild bilateral subarticular narrowing with slight crowding of the descending S1 nerve roots. Central canal patent. No significant foraminal stenosis. IMPRESSION: Diffuse osseous metastatic disease within the lumbar spine and visualized lower thoracic spine, sacrum and pelvis. L4 superior endplate compression fracture (30% height loss), unchanged as compared to the CT abdomen/pelvis of 09/05/2021. This may reflect a pathologic fracture. Lumbar spondylosis, as outlined and with findings most notably as follows. At L3-L4,  there is multifactorial bilateral subarticular stenosis with potential to affect either descending L4 nerve root. Moderate to moderately advanced central canal stenosis. Mild bilateral neural foraminal narrowing. At L4-L5, there is a broad-based right center to right foraminal disc protrusion contributing to right subarticular stenosis and encroaching upon the descending right L5 nerve root. Mild relative narrowing of the central canal. At L5-S1, there is multifactorial mild bilateral subarticular narrowing with slight crowding of the descending S1 nerve roots. Lower lumbar dextrocurvature. Trace grade 1 anterolisthesis at L3-L4. Polycystic kidneys and bulky retroperitoneal lymphadenopathy more fully characterized on the prior CT abdomen/pelvis. Electronically Signed   By: Kellie Simmering D.O.   On: 09/05/2021 18:29   CT ABDOMEN PELVIS W CONTRAST  Result Date: 09/05/2021 CLINICAL DATA:  Pt to ER via POV with complaints of decreased appetite/ generalized back pain that started 2-3 weeks ago. Reports when he takes a deep breath his whole back hurts. Has been trying to drink ensures but they make him nauseous. Has  been taking OTC pain medications with some relief in back pain, denies injury. History of prostate carcinoma. EXAM: CT ANGIOGRAPHY CHEST CT ABDOMEN AND PELVIS WITH CONTRAST TECHNIQUE: Multidetector CT imaging of the chest was performed using the standard protocol during bolus administration of intravenous contrast. Multiplanar CT image reconstructions and MIPs were obtained to evaluate the vascular anatomy. Multidetector CT imaging of the abdomen and pelvis was performed using the standard protocol during bolus administration of intravenous contrast. CONTRAST:  56mL OMNIPAQUE IOHEXOL 350 MG/ML SOLN COMPARISON:  CT a chest, 07/28/2021. CT abdomen and pelvis, 06/10/2021. FINDINGS: CTA CHEST FINDINGS Cardiovascular: Pulmonary arteries are well opacified. Mild limitation due to respiratory motion affecting assessment of the smaller segmental branches to the lower lungs. Allowing for this mild limitation, there is no evidence of a pulmonary embolism. Heart is normal in size. No pericardial effusion. Left coronary artery calcifications. Great vessels are normal in caliber. Aorta only minimally opacified. No dissection. Mild atherosclerosis. Mediastinum/Nodes: Oval mass at the left neck base, measuring 4.5 x 3.4 x 3.2 cm, consistent with an enlarged lymph node, similar to the prior CT. No mediastinal or hilar masses or enlarged lymph nodes. Mild distension of the esophagus. Trachea is unremarkable. Lungs/Pleura: Linear opacities, both lower lobes, right middle lobe and left upper lobe lingula, consistent with atelectasis. Moderate centrilobular emphysema. No evidence of pneumonia or pulmonary edema. No mass or suspicious nodule. No pleural effusion or pneumothorax. Musculoskeletal: Multiple sclerotic bone lesions consistent with metastatic disease, without significant change from the prior CT. Mild compression fracture of T9, new since the prior exam. No other fractures. Review of the MIP images confirms the above  findings. CT ABDOMEN and PELVIS FINDINGS Hepatobiliary: Liver normal in size and overall attenuation. Several small low-attenuation liver masses, stable consistent with cysts. Two dependent gallstones. No evidence of acute cholecystitis. No bile duct dilation. Pancreas: Unremarkable. No pancreatic ductal dilatation or surrounding inflammatory changes. Spleen: Normal in size without focal abnormality. Adrenals/Urinary Tract: Adrenal glands not well-defined due to adjacent mass effect from adenopathy and the cystic kidneys. Both kidneys are notably enlarged with numerous cysts, unchanged compared to the prior CT. No solid renal masses. No hydronephrosis. Bladder only mildly distended.  Prostate indents the bladder base. Stomach/Bowel: Rectal wall thickening, 1 cm in thickness. Colon mildly distended with moderate increase in the colonic stool burden. No convincing colonic wall thickening or inflammation. Small bowel is normal in caliber. No convincing wall thickening or inflammation. Bowel assessment is somewhat limited by lack of contrast  in lack of peritoneal fat. Stomach is unremarkable. Vascular/Lymphatic: Multiple enlarged retroperitoneal lymph nodes which extend to the aortic hiatus. There is a low-attenuation enlarged lymph node at the aortic hiatus measuring 2.7 cm in short axis. Left periaortic node at the level of the left renal vein measures 3 cm in short axis. Nodes posterior to the infrarenal abdominal aorta measures 2.4 cm in short axis. Adenopathy has markedly increased when compared to the prior CT. Aortic atherosclerosis.  Dilated left common iliac artery to 1.8 cm. Reproductive: Heterogeneous prostate with ill-defined margins, indenting the bladder base, similar to the prior CT. Other: No ascites or abdominal wall hernia. Musculoskeletal: Multiple sclerotic metastatic lesions similar to the prior CT. Mild compression fracture of L4 with depression of the upper endplate new/increased from the prior  CT. No other fractures. Review of the MIP images confirms the above findings. IMPRESSION: CTA CHEST 1. Mildly limited due to respiratory motion. Allowing for this, no evidence of a pulmonary embolism. 2. No acute findings. 3. Linear areas of atelectasis/scarring in the lungs. 4. Emphysema. 5. Aortic atherosclerosis. 6. Multiple sclerotic bone lesions reflecting metastatic disease from prostate carcinoma, stable from the recent prior chest CTA. ABDOMEN AND PELVIS CT 1. No convincing acute abnormality. 2. Bulky retroperitoneal adenopathy which has markedly increased when compared to the prior CT consistent with metastatic disease from prostate carcinoma. 3. Moderate increase in the colonic stool burden. No evidence of bowel obstruction or inflammation. 4. Multiple sclerotic bone lesions consistent with metastatic disease, stable from the prior abdomen and pelvis CT. 5. Multiple renal cysts consistent with polycystic kidneys, unchanged from the prior CT. 6. Aortic atherosclerosis. Electronically Signed   By: Lajean Manes M.D.   On: 09/05/2021 16:32     Assessment and plan- Patient is a 87 y.o. 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  Overall patient has progressed on ADT alone.  He is not a candidate for any systemic chemotherapy with oral antiandrogen drugs such as Zytiga could be considered which can achieve meaningful disease control and improve his quality of life.  However these medications need to be taken in a consistent and reliable manner to show benefit.  Patient was living alone prior to his hospitalization in poor conditions and therefore this medication was not started previously.  Presently plan is for the patient to go to a rehab and following  that it remains to be seen if he is going to be discharged back home versus sent to a nursing home.  I believe the patient is transition to extended care facility we could start the patient on Zytiga 2 to 3 weeks from now.  Discussed risks and benefits of Zytiga including all but not limited to leg swelling, fatigue, nausea, abnormal Lfts. Patient understands and agrees to proceed as planned. He states he wants to do everything he can to treat his prostate cancer. He understands it is not curable and treatment is palliative. Patient understands that if we cannot use oral ADT- we are looking at hospice. I have spoken to his son [name] as well and he is on board with the plan as well   Visit Diagnosis 1. AKI (acute kidney injury) ([place])   2. Failure to thrive in adult   3. Prostate cancer metastatic to  bone [facility])      Dr. Randa Evens, MD, MPH [facility] [phone] 09/09/2021 7:11 AM

## 2021-09-10 ENCOUNTER — Telehealth: Payer: Self-pay | Admitting: Pharmacist

## 2021-09-10 DIAGNOSIS — C61 Malignant neoplasm of prostate: Secondary | ICD-10-CM

## 2021-09-10 MED ORDER — LIDOCAINE 5 % EX PTCH: 2.0000 | MEDICATED_PATCH | CUTANEOUS | 0 refills | Status: AC

## 2021-09-10 MED ORDER — ADULT MULTIVITAMIN W/MINERALS CH: 1.0000 | ORAL_TABLET | Freq: Every day | ORAL | Status: AC

## 2021-09-10 MED ORDER — POLYETHYLENE GLYCOL 3350 17 G PO PACK: 17.0000 g | PACK | Freq: Every day | ORAL | 0 refills | Status: AC

## 2021-09-10 MED ORDER — OXYCODONE-ACETAMINOPHEN 5-325 MG PO TABS: ORAL_TABLET | ORAL | 0 refills | Status: AC

## 2021-09-10 MED ORDER — MAGNESIUM OXIDE -MG SUPPLEMENT 400 (240 MG) MG PO TABS: 400.0000 mg | ORAL_TABLET | Freq: Two times a day (BID) | ORAL | Status: AC

## 2021-09-10 NOTE — Progress Notes (Signed)
Physical Therapy Treatment Patient Details Name: Jacob [name] MRN: [id number] DOB: 04-Mar-1934 Today's Date: 09/10/2021   History of Present Illness MARILYN WING is a 87 y.o. male with medical history significant for metastatic prostate cancer, hypertension, hyperlipidemia, neuropathy, Parkinson's, BPH, presents the emergency department for chief concerns of back pain.    PT Comments    Pt on commode for bathing with tech upon arrival.  Pt voicing frustrations over poor lighting - all lights on in room, no portable mirrors, and "cheap" razors.  Assisted with competing bath, walking around bed to mirror to sit on EOB to shave then back around bed to chair with min a x 1.  Pt generally irritable this am and directing care.  Assisted pt as asked.    Overall improved gait but generally limited by weakness and fatigues quickly with tasks.  SNF remains appropriate for discharge.   Recommendations for follow up therapy are one component of a multi-disciplinary discharge planning process, led by the attending physician.  Recommendations may be updated based on patient status, additional functional criteria and insurance authorization.  Follow Up Recommendations  SNF;Supervision/Assistance - 24 hour     Equipment Recommendations  Other (comment)    Recommendations for Other Services       Precautions / Restrictions Precautions Precautions: Fall Restrictions Weight Bearing Restrictions: No     Mobility  Bed Mobility               General bed mobility comments: up on commode with tech upon arrival    Transfers Overall transfer level: Needs assistance Equipment used: Rolling walker (2 wheeled) Transfers: Sit to/from Stand Sit to Stand: Min guard            Ambulation/Gait Ambulation/Gait assistance: Min Web designer (Feet): 15 Feet Assistive device: Rolling walker (2 wheeled) Gait Pattern/deviations: Step-to pattern;Decreased step length - right;Decreased step  length - left;Trunk flexed;Decreased stride length Gait velocity: decreased   General Gait Details: 15' x 2 around bed to sink for shaving   Stairs             Wheelchair Mobility    Modified Rankin (Stroke Patients Only)       Balance Overall balance assessment: Needs assistance Sitting-balance support: Feet supported Sitting balance-Leahy Scale: Good     Standing balance support: During functional activity;Single extremity supported Standing balance-Leahy Scale: Fair Standing balance comment: CGA required due to poor safety, patient standing with head between legs for perihygiene                            Cognition Arousal/Alertness: Awake/alert Behavior During Therapy: WFL for tasks assessed/performed;Agitated Overall Cognitive Status: Within Functional Limits for tasks assessed                                 General Comments: self directing session - frustrated over "cheap" razors to shave with      Exercises Other Exercises Other Exercises: assisted with bathing and ADL tasks/balance    General Comments        Pertinent Vitals/Pain Pain Assessment: Faces Faces Pain Scale: Hurts little more Pain Location: Back Pain Descriptors / Indicators: Discomfort;Sore Pain Intervention(s): Limited activity within patient's tolerance;Monitored during session;Repositioned    Home Living                      Prior Function  PT Goals (current goals can now be found in the care plan section) Progress towards PT goals: Progressing toward goals    Frequency    Min 2X/week      PT Plan Current plan remains appropriate    Co-evaluation              AM-PAC PT "6 Clicks" Mobility   Outcome Measure  Help needed turning from your back to your side while in a flat bed without using bedrails?: A Little Help needed moving from lying on your back to sitting on the side of a flat bed without using bedrails?: A  Little Help needed moving to and from a bed to a chair (including a wheelchair)?: A Little Help needed standing up from a chair using your arms (e.g., wheelchair or bedside chair)?: A Little Help needed to walk in hospital room?: A Little Help needed climbing 3-5 steps with a railing? : A Lot 6 Click Score: 17    End of Session Equipment Utilized During Treatment: Gait belt Activity Tolerance: Patient tolerated treatment well Patient left: in chair;with call bell/phone within reach;with chair alarm set Nurse Communication: Mobility status       Time: 3491-7915 PT Time Calculation (min) (ACUTE ONLY): 29 min  Charges:  $Gait Training: 8-22 mins $Therapeutic Activity: 8-22 mins                    Chesley Noon, PTA 09/10/21, 8:38 AM

## 2021-09-10 NOTE — Care Management Important Message (Signed)
Important Message  Patient Details  Name: Jacob [name] MRN: [id number] Date of Birth: 1934-01-28   Medicare Important Message Given:  N/A - LOS <3 / Initial given by admissions     Dannette Barbara 09/10/2021, 9:34 AM

## 2021-09-10 NOTE — Telephone Encounter (Addendum)
Oral Oncology Pharmacist Encounter  Received new prescription for Zytiga (abiraterone) for the treatment of metastatic castrate resistant prostate cancer in conjunction with prednisone and ADT, planned duration until disease progression or unacceptable drug toxicity.  CMP from 08/16/21 assessed, no relevant lab abnormalities. Prescription dose and frequency assessed.   Current medication list in Epic reviewed, a few DDIs with abiraterone identified: - Abiraterone may increase the serum concentration of amitriptyline (taken prn per Epic med notes), metoprolol, and tamsulosin. Monitor patient for an increase in side effects of the listed medications. No baseline dose adjustment needed.   Evaluated chart and no patient barriers to medication adherence identified.   Prescription has been e-scribed to the [facility] for benefits analysis and approval.  Oral Oncology Clinic will continue to follow for insurance authorization, copayment issues, initial counseling and start date.  Darl Pikes, PharmD, BCPS, BCOP, CPP Hematology/Oncology Clinical Pharmacist Practitioner [facility]/HP/AP [facility] [phone]  09/10/2021 2:37 PM

## 2021-09-10 NOTE — TOC Transition Note (Signed)
Transition of Care [facility]) - CM/SW Discharge Note   Patient Details  Name: Jacob [name] MRN: [id number] Date of Birth: 01-16-1934  [facility] CM/SW Contact:  Pete Pelt, RN Phone Number: 09/10/2021, 10:07 AM   Clinical Narrative:   Patient can transfer to facility today at noontime via ems.  Family aware.        Barriers to Discharge: Continued Medical Work up   Patient Goals and CMS Choice Patient states their goals for this hospitalization and ongoing recovery are:: To be in a facility      Discharge Placement                       Discharge Plan and Services In-house Referral: Hospice / Palliative Care Discharge Planning Services: CM Consult                                 Social Determinants of Health (SDOH) Interventions     Readmission Risk Interventions No flowsheet data found.

## 2021-09-10 NOTE — Discharge Summary (Signed)
Physician Discharge Summary  Jacob [name] DPO:[id number] DOB: [birth year] DOA: 09/05/2021  PCP: [name], MD (Inactive)  Admit date: 09/05/2021 Discharge date: 09/10/2021  Admitted From: home alone  Disposition:  SNF    Recommendations for Outpatient Follow-up:  Recommend palliative care to continue end of life discussions Follow up on back pain and adjust medications to comfort  Home Health:  none  Discharge Condition:  stable   CODE STATUS:  Full code   Diet recommendation:  regular diet Consultations: Palliative care  Procedures/Studies: none   Discharge Diagnoses:  Principal Problem:   Prostate cancer metastatic to bone [facility]) Active Problems:   Failure to thrive in adult   AKI (acute kidney injury) ([place])   Palliative care by specialist   Full code status   Essential hypertension   Benign prostatic hyperplasia   Back pain related to metastasis   B12 deficiency   Family history of prostate cancer   Parkinson disease ([place])   Depression   COPD without acute exacerbation ([place])   Protein-calorie malnutrition, severe        Brief Summary: GAMAL TODISCO is a 87 y.o. male with medical history significant for metastatic prostate cancer, hypertension, hyperlipidemia, neuropathy, Parkinson's, BPH, presented to the hospital with complaints of generalized weakness and back pain and failure to thrive.  In the ED, patient was noted to be slightly bradycardic.  Labs were significant for creatinine of 1.36.  Patient was given fentanyl IV fluids and patient was admitted to hospital for further evaluation treatment.  Hospital Course:  Principal Problem:   Prostate cancer metastatic to bone [facility]) with failure to thrive - Patient has very poor functional and nutritional status - Continue palliative care discussions- at this point he is being discharged to SNF with palliative care   Active Problems: Back pain - Related to metastasis and compression fractures - Continue pain  control   Underweight Body mass index is 17.31 kg/m.   AKI related to volume depletion - Has resolved- appears to be drinking better now   Mildly elevated troponin level - Troponin 58     Essential hypertension -Continue amlodipine and metoprolol     Benign prostatic hyperplasia -Continue Flomax and Proscar     Parkinson's disease - cont Sinement   Depression and anxiety - Continue amitriptyline  Peripheral neuropathy - Continue Neurontin  Severe protein calorie malnutrition - Continue dietary supplements     Discharge Exam: Vitals:   09/10/21 0546 09/10/21 0728  BP: 138/88 133/86  Pulse: 82 81  Resp: 16 16  Temp: 98.2 F (36.8 C) 98.8 F (37.1 C)  SpO2: 93% 94%   Vitals:   09/09/21 1942 09/09/21 2100 09/10/21 0546 09/10/21 0728  BP: 133/71  138/88 133/86  Pulse: 87  82 81  Resp: 16  16 16   Temp: 98.1 F (36.7 C)  98.2 F (36.8 C) 98.8 F (37.1 C)  TempSrc:      SpO2: 97%  93% 94%  Weight:  59 kg    Height:        General: Pt is alert, awake, not in acute distress Cardiovascular: RRR, S1/S2 +, no rubs, no gallops Respiratory: CTA bilaterally, no wheezing, no rhonchi Abdominal: Soft, NT, ND, bowel sounds + Extremities: no edema, no cyanosis   Discharge Instructions  Discharge Instructions     Increase activity slowly   Complete by: As directed       Allergies as of 09/10/2021   No Known Allergies  Medication List     STOP taking these medications    torsemide 10 MG tablet Commonly known as: DEMADEX       TAKE these medications    acetaminophen 500 MG tablet Commonly known as: TYLENOL Take 500 mg by mouth every 4 (four) hours as needed.   amitriptyline 10 MG tablet Commonly known as: ELAVIL 20 mg at bedtime.   amLODipine 10 MG tablet Commonly known as: NORVASC Take 5 mg by mouth daily.   aspirin 81 MG tablet Take 81 mg by mouth daily. PM   carbidopa-levodopa 25-100 MG tablet Commonly known as: SINEMET  IR Take 1 tablet by mouth 3 (three) times daily.   cetirizine 10 MG tablet Commonly known as: ZYRTEC Take 10 mg by mouth daily as needed.   feeding supplement Liqd Take 237 mLs by mouth 3 (three) times daily between meals.   finasteride 5 MG tablet Commonly known as: PROSCAR TAKE 1 TABLET BY MOUTH EVERY DAY   gabapentin 100 MG capsule Commonly known as: NEURONTIN Take 100 mg by mouth 2 (two) times daily.   lidocaine 5 % Commonly known as: LIDODERM Place 2 patches onto the skin daily. Apply to lower back. Remove & Discard patch within 12 hours or as directed by MD   magnesium oxide 400 (240 Mg) MG tablet Commonly known as: MAG-OX Take 1 tablet (400 mg total) by mouth 2 (two) times daily.   metoprolol succinate 50 MG 24 hr tablet Commonly known as: TOPROL-XL Take 50 mg by mouth daily.   mirtazapine 7.5 MG tablet Commonly known as: REMERON Take 7.5 mg by mouth at bedtime.   multivitamin with minerals Tabs tablet Take 1 tablet by mouth daily.   oxyCODONE-acetaminophen 5-325 MG tablet Commonly known as: PERCOCET/ROXICET Give 1 tab Q4 PRN for moderate pain and 2 tabs Q4 PRN for severe pain.   polyethylene glycol 17 g packet Commonly known as: MIRALAX / GLYCOLAX Take 17 g by mouth daily. What changed:  when to take this reasons to take this   Systane Balance 0.6 % Soln Generic drug: Propylene Glycol INSTILL 3 5 TIMES A DAY IN EACH EYE AS NEEDED FOR DRYNESS.   tamsulosin 0.4 MG Caps capsule Commonly known as: FLOMAX TAKE 1 CAPSULE BY MOUTH EVERY DAY   vitamin B-12 500 MCG tablet Commonly known as: CYANOCOBALAMIN Take 500 mcg by mouth daily. PM        No Known Allergies    CT HEAD WO CONTRAST (5MM)  Result Date: 09/05/2021 CLINICAL DATA:  Dizziness. EXAM: CT HEAD WITHOUT CONTRAST TECHNIQUE: Contiguous axial images were obtained from the base of the skull through the vertex without intravenous contrast. COMPARISON:  November 17, 2012. FINDINGS: Brain: No  evidence of acute infarction, hemorrhage, hydrocephalus, extra-axial collection or mass lesion/mass effect. Vascular: No hyperdense vessel or unexpected calcification. Skull: Normal. Negative for fracture or focal lesion. Sinuses/Orbits: No acute finding. Other: None. IMPRESSION: No acute intracranial abnormality seen. Electronically Signed   By: Marijo Conception M.D.   On: 09/05/2021 16:15   CT Angio Chest PE W and/or Wo Contrast  Result Date: 09/05/2021 CLINICAL DATA:  Pt to ER via POV with complaints of decreased appetite/ generalized back pain that started 2-3 weeks ago. Reports when he takes a deep breath his whole back hurts. Has been trying to drink ensures but they make him nauseous. Has been taking OTC pain medications with some relief in back pain, denies injury. History of prostate carcinoma. EXAM: CT ANGIOGRAPHY CHEST CT  ABDOMEN AND PELVIS WITH CONTRAST TECHNIQUE: Multidetector CT imaging of the chest was performed using the standard protocol during bolus administration of intravenous contrast. Multiplanar CT image reconstructions and MIPs were obtained to evaluate the vascular anatomy. Multidetector CT imaging of the abdomen and pelvis was performed using the standard protocol during bolus administration of intravenous contrast. CONTRAST:  31mL OMNIPAQUE IOHEXOL 350 MG/ML SOLN COMPARISON:  CT a chest, 07/28/2021. CT abdomen and pelvis, 06/10/2021. FINDINGS: CTA CHEST FINDINGS Cardiovascular: Pulmonary arteries are well opacified. Mild limitation due to respiratory motion affecting assessment of the smaller segmental branches to the lower lungs. Allowing for this mild limitation, there is no evidence of a pulmonary embolism. Heart is normal in size. No pericardial effusion. Left coronary artery calcifications. Great vessels are normal in caliber. Aorta only minimally opacified. No dissection. Mild atherosclerosis. Mediastinum/Nodes: Oval mass at the left neck base, measuring 4.5 x 3.4 x 3.2 cm,  consistent with an enlarged lymph node, similar to the prior CT. No mediastinal or hilar masses or enlarged lymph nodes. Mild distension of the esophagus. Trachea is unremarkable. Lungs/Pleura: Linear opacities, both lower lobes, right middle lobe and left upper lobe lingula, consistent with atelectasis. Moderate centrilobular emphysema. No evidence of pneumonia or pulmonary edema. No mass or suspicious nodule. No pleural effusion or pneumothorax. Musculoskeletal: Multiple sclerotic bone lesions consistent with metastatic disease, without significant change from the prior CT. Mild compression fracture of T9, new since the prior exam. No other fractures. Review of the MIP images confirms the above findings. CT ABDOMEN and PELVIS FINDINGS Hepatobiliary: Liver normal in size and overall attenuation. Several small low-attenuation liver masses, stable consistent with cysts. Two dependent gallstones. No evidence of acute cholecystitis. No bile duct dilation. Pancreas: Unremarkable. No pancreatic ductal dilatation or surrounding inflammatory changes. Spleen: Normal in size without focal abnormality. Adrenals/Urinary Tract: Adrenal glands not well-defined due to adjacent mass effect from adenopathy and the cystic kidneys. Both kidneys are notably enlarged with numerous cysts, unchanged compared to the prior CT. No solid renal masses. No hydronephrosis. Bladder only mildly distended.  Prostate indents the bladder base. Stomach/Bowel: Rectal wall thickening, 1 cm in thickness. Colon mildly distended with moderate increase in the colonic stool burden. No convincing colonic wall thickening or inflammation. Small bowel is normal in caliber. No convincing wall thickening or inflammation. Bowel assessment is somewhat limited by lack of contrast in lack of peritoneal fat. Stomach is unremarkable. Vascular/Lymphatic: Multiple enlarged retroperitoneal lymph nodes which extend to the aortic hiatus. There is a low-attenuation enlarged  lymph node at the aortic hiatus measuring 2.7 cm in short axis. Left periaortic node at the level of the left renal vein measures 3 cm in short axis. Nodes posterior to the infrarenal abdominal aorta measures 2.4 cm in short axis. Adenopathy has markedly increased when compared to the prior CT. Aortic atherosclerosis.  Dilated left common iliac artery to 1.8 cm. Reproductive: Heterogeneous prostate with ill-defined margins, indenting the bladder base, similar to the prior CT. Other: No ascites or abdominal wall hernia. Musculoskeletal: Multiple sclerotic metastatic lesions similar to the prior CT. Mild compression fracture of L4 with depression of the upper endplate new/increased from the prior CT. No other fractures. Review of the MIP images confirms the above findings. IMPRESSION: CTA CHEST 1. Mildly limited due to respiratory motion. Allowing for this, no evidence of a pulmonary embolism. 2. No acute findings. 3. Linear areas of atelectasis/scarring in the lungs. 4. Emphysema. 5. Aortic atherosclerosis. 6. Multiple sclerotic bone lesions reflecting metastatic disease from  prostate carcinoma, stable from the recent prior chest CTA. ABDOMEN AND PELVIS CT 1. No convincing acute abnormality. 2. Bulky retroperitoneal adenopathy which has markedly increased when compared to the prior CT consistent with metastatic disease from prostate carcinoma. 3. Moderate increase in the colonic stool burden. No evidence of bowel obstruction or inflammation. 4. Multiple sclerotic bone lesions consistent with metastatic disease, stable from the prior abdomen and pelvis CT. 5. Multiple renal cysts consistent with polycystic kidneys, unchanged from the prior CT. 6. Aortic atherosclerosis. Electronically Signed   By: Lajean Manes M.D.   On: 09/05/2021 16:32   MR THORACIC SPINE WO CONTRAST  Result Date: 09/05/2021 CLINICAL DATA:  Mid back pain. Three weeks of severe back pain, history of metastatic prostate cancer. EXAM: MRI THORACIC  SPINE WITHOUT CONTRAST TECHNIQUE: Multiplanar, multisequence MR imaging of the thoracic spine was performed. No intravenous contrast was administered. COMPARISON:  CT angiogram chest 09/05/2021. FINDINGS: Mild-to-moderate intermittent motion degradation. Alignment: No significant spondylolisthesis. Thoracic dextrocurvature. Vertebrae: There is multifocal marrow signal abnormality throughout the thoracic spine compatible with diffuse osseous metastatic disease. Mild T9 vertebral compression fracture, (approximately 20% height loss), unchanged as compared to the same-day chest CT of 09/05/2021. Subtle T10 superior endplate deformity with minimal height loss. Vertebral body height is otherwise maintained. Cord: Within the limitations of motion degradation, no spinal cord signal abnormality is identified. Paraspinal and other soft tissues: Trace bilateral pleural effusions. Redemonstrated hepatic cyst. Polycystic kidneys, incompletely imaged. Paraspinal soft tissues unremarkable. Disc levels: Multilevel mild-to-moderate disc degeneration. There are multilevel disc bulges. However, there is no significant focal disc herniation or spinal canal stenosis. No compressive bony neural foraminal narrowing. IMPRESSION: Motion degraded exam. Diffuse osseous metastatic disease throughout the thoracic spine. Mild T9 superior endplate vertebral compression fracture (20% height loss), unchanged as compared to the chest CT performed earlier today. This may reflect a pathologic fracture. Subtle T10 superior endplate deformity with minimal height loss. Thoracic spondylosis, as described. No significant spinal canal stenosis or compressive foraminal narrowing. Electronically Signed   By: Kellie Simmering D.O.   On: 09/05/2021 18:36   MR LUMBAR SPINE WO CONTRAST  Result Date: 09/05/2021 CLINICAL DATA:  Low back pain, cancer suspected. Three week history of severe back pain with history of metastatic prostate cancer. EXAM: MRI LUMBAR SPINE  WITHOUT CONTRAST TECHNIQUE: Multiplanar, multisequence MR imaging of the lumbar spine was performed. No intravenous contrast was administered. COMPARISON:  CT abdomen/pelvis 09/05/2021. FINDINGS: Segmentation: 5 lumbar vertebrae. The caudal most well-formed intervertebral disc space is designated L5-S1. Alignment: Straightening of the expected lumbar lordosis. Trace L3-L4 grade 1 anterolisthesis. Lower lumbar dextrocurvature. Vertebrae: There is multifocal signal abnormality within the lumbar spine, visualized lower thoracic spine, sacrum and pelvis compatible with diffuse osseous metastatic disease. L4 superior endplate compression deformity (30% height loss) unchanged as compared to the CT abdomen/pelvis of 09/05/2021. Vertebral body height is otherwise maintained. Conus medullaris and cauda equina: Conus extends to the T12-L1 level. No signal abnormality within the visualized distal spinal cord. Paraspinal and other soft tissues: Polycystic kidneys and bulky retroperitoneal lymphadenopathy more fully characterized on the prior CT abdomen/pelvis of 09/05/2021. Disc levels: Multilevel disc degeneration. Most notably, there is moderate to moderately advanced disc degeneration at T12-L1, L4-L5 and L5-S1. T12-L1: Disc bulge with endplate spurring. No significant spinal canal or foraminal stenosis. L1-L2: Disc bulge with endplate spurring. No significant spinal canal or foraminal stenosis. L2-L3: Trace grade 1 anterolisthesis. Disc bulge with endplate spurring. Mild facet arthrosis. No significant spinal canal or foraminal stenosis.  L3-L4: Trace bony retropulsion at the level of the L4 superior endplate. Disc bulge with endplate spurring. Facet arthrosis. Bilateral subarticular stenosis with potential to affect either descending L4 nerve root. Moderate to moderately severe central canal stenosis. Mild bilateral neural foraminal narrowing (greater on the right). L4-L5: Disc bulge with endplate spurring. Superimposed  broad-based right center to right foraminal disc protrusion. Facet arthrosis. The disc protrusion contributes to right subarticular stenosis, encroaching upon the descending right L5 nerve root. Mild central canal stenosis. No significant foraminal narrowing. L5-S1: Disc bulge with endplate spurring. Facet arthrosis with ligamentum flavum hypertrophy. Mild bilateral subarticular narrowing with slight crowding of the descending S1 nerve roots. Central canal patent. No significant foraminal stenosis. IMPRESSION: Diffuse osseous metastatic disease within the lumbar spine and visualized lower thoracic spine, sacrum and pelvis. L4 superior endplate compression fracture (30% height loss), unchanged as compared to the CT abdomen/pelvis of 09/05/2021. This may reflect a pathologic fracture. Lumbar spondylosis, as outlined and with findings most notably as follows. At L3-L4, there is multifactorial bilateral subarticular stenosis with potential to affect either descending L4 nerve root. Moderate to moderately advanced central canal stenosis. Mild bilateral neural foraminal narrowing. At L4-L5, there is a broad-based right center to right foraminal disc protrusion contributing to right subarticular stenosis and encroaching upon the descending right L5 nerve root. Mild relative narrowing of the central canal. At L5-S1, there is multifactorial mild bilateral subarticular narrowing with slight crowding of the descending S1 nerve roots. Lower lumbar dextrocurvature. Trace grade 1 anterolisthesis at L3-L4. Polycystic kidneys and bulky retroperitoneal lymphadenopathy more fully characterized on the prior CT abdomen/pelvis. Electronically Signed   By: Kellie Simmering D.O.   On: 09/05/2021 18:29   CT ABDOMEN PELVIS W CONTRAST  Result Date: 09/05/2021 CLINICAL DATA:  Pt to ER via POV with complaints of decreased appetite/ generalized back pain that started 2-3 weeks ago. Reports when he takes a deep breath his whole back hurts. Has  been trying to drink ensures but they make him nauseous. Has been taking OTC pain medications with some relief in back pain, denies injury. History of prostate carcinoma. EXAM: CT ANGIOGRAPHY CHEST CT ABDOMEN AND PELVIS WITH CONTRAST TECHNIQUE: Multidetector CT imaging of the chest was performed using the standard protocol during bolus administration of intravenous contrast. Multiplanar CT image reconstructions and MIPs were obtained to evaluate the vascular anatomy. Multidetector CT imaging of the abdomen and pelvis was performed using the standard protocol during bolus administration of intravenous contrast. CONTRAST:  44mL OMNIPAQUE IOHEXOL 350 MG/ML SOLN COMPARISON:  CT a chest, 07/28/2021. CT abdomen and pelvis, 06/10/2021. FINDINGS: CTA CHEST FINDINGS Cardiovascular: Pulmonary arteries are well opacified. Mild limitation due to respiratory motion affecting assessment of the smaller segmental branches to the lower lungs. Allowing for this mild limitation, there is no evidence of a pulmonary embolism. Heart is normal in size. No pericardial effusion. Left coronary artery calcifications. Great vessels are normal in caliber. Aorta only minimally opacified. No dissection. Mild atherosclerosis. Mediastinum/Nodes: Oval mass at the left neck base, measuring 4.5 x 3.4 x 3.2 cm, consistent with an enlarged lymph node, similar to the prior CT. No mediastinal or hilar masses or enlarged lymph nodes. Mild distension of the esophagus. Trachea is unremarkable. Lungs/Pleura: Linear opacities, both lower lobes, right middle lobe and left upper lobe lingula, consistent with atelectasis. Moderate centrilobular emphysema. No evidence of pneumonia or pulmonary edema. No mass or suspicious nodule. No pleural effusion or pneumothorax. Musculoskeletal: Multiple sclerotic bone lesions consistent with metastatic disease, without  significant change from the prior CT. Mild compression fracture of T9, new since the prior exam. No other  fractures. Review of the MIP images confirms the above findings. CT ABDOMEN and PELVIS FINDINGS Hepatobiliary: Liver normal in size and overall attenuation. Several small low-attenuation liver masses, stable consistent with cysts. Two dependent gallstones. No evidence of acute cholecystitis. No bile duct dilation. Pancreas: Unremarkable. No pancreatic ductal dilatation or surrounding inflammatory changes. Spleen: Normal in size without focal abnormality. Adrenals/Urinary Tract: Adrenal glands not well-defined due to adjacent mass effect from adenopathy and the cystic kidneys. Both kidneys are notably enlarged with numerous cysts, unchanged compared to the prior CT. No solid renal masses. No hydronephrosis. Bladder only mildly distended.  Prostate indents the bladder base. Stomach/Bowel: Rectal wall thickening, 1 cm in thickness. Colon mildly distended with moderate increase in the colonic stool burden. No convincing colonic wall thickening or inflammation. Small bowel is normal in caliber. No convincing wall thickening or inflammation. Bowel assessment is somewhat limited by lack of contrast in lack of peritoneal fat. Stomach is unremarkable. Vascular/Lymphatic: Multiple enlarged retroperitoneal lymph nodes which extend to the aortic hiatus. There is a low-attenuation enlarged lymph node at the aortic hiatus measuring 2.7 cm in short axis. Left periaortic node at the level of the left renal vein measures 3 cm in short axis. Nodes posterior to the infrarenal abdominal aorta measures 2.4 cm in short axis. Adenopathy has markedly increased when compared to the prior CT. Aortic atherosclerosis.  Dilated left common iliac artery to 1.8 cm. Reproductive: Heterogeneous prostate with ill-defined margins, indenting the bladder base, similar to the prior CT. Other: No ascites or abdominal wall hernia. Musculoskeletal: Multiple sclerotic metastatic lesions similar to the prior CT. Mild compression fracture of L4 with  depression of the upper endplate new/increased from the prior CT. No other fractures. Review of the MIP images confirms the above findings. IMPRESSION: CTA CHEST 1. Mildly limited due to respiratory motion. Allowing for this, no evidence of a pulmonary embolism. 2. No acute findings. 3. Linear areas of atelectasis/scarring in the lungs. 4. Emphysema. 5. Aortic atherosclerosis. 6. Multiple sclerotic bone lesions reflecting metastatic disease from prostate carcinoma, stable from the recent prior chest CTA. ABDOMEN AND PELVIS CT 1. No convincing acute abnormality. 2. Bulky retroperitoneal adenopathy which has markedly increased when compared to the prior CT consistent with metastatic disease from prostate carcinoma. 3. Moderate increase in the colonic stool burden. No evidence of bowel obstruction or inflammation. 4. Multiple sclerotic bone lesions consistent with metastatic disease, stable from the prior abdomen and pelvis CT. 5. Multiple renal cysts consistent with polycystic kidneys, unchanged from the prior CT. 6. Aortic atherosclerosis. Electronically Signed   By: Lajean Manes M.D.   On: 09/05/2021 16:32     The results of significant diagnostics from this hospitalization (including imaging, microbiology, ancillary and laboratory) are listed below for reference.     Microbiology: Recent Results (from the past 240 hour(s))  Resp Panel by RT-PCR (Flu A&B, Covid) Nasopharyngeal Swab     Status: None   Collection Time: 09/05/21  3:07 PM   Specimen: Nasopharyngeal Swab; Nasopharyngeal(NP) swabs in vial transport medium  Result Value Ref Range Status   SARS Coronavirus 2 by RT PCR NEGATIVE NEGATIVE Final    Comment: (NOTE) SARS-CoV-2 target nucleic acids are NOT DETECTED.  The SARS-CoV-2 RNA is generally detectable in upper respiratory specimens during the acute phase of infection. The lowest concentration of SARS-CoV-2 viral copies this assay can detect is 138 copies/mL. A  negative result does not  preclude SARS-Cov-2 infection and should not be used as the sole basis for treatment or other patient management decisions. A negative result may occur with  improper specimen collection/handling, submission of specimen other than nasopharyngeal swab, presence of viral mutation(s) within the areas targeted by this assay, and inadequate number of viral copies(<138 copies/mL). A negative result must be combined with clinical observations, patient history, and epidemiological information. The expected result is Negative.  Fact Sheet for Patients:  EntrepreneurPulse.com.au  Fact Sheet for Healthcare Providers:  IncredibleEmployment.be  This test is no t yet approved or cleared by the [place] FDA and  has been authorized for detection and/or diagnosis of SARS-CoV-2 by FDA under an Emergency Use Authorization (EUA). This EUA will remain  in effect (meaning this test can be used) for the duration of the COVID-19 declaration under Section 564(b)(1) of the Act, 21 U.S.C.section 360bbb-3(b)(1), unless the authorization is terminated  or revoked sooner.       Influenza A by PCR NEGATIVE NEGATIVE Final   Influenza B by PCR NEGATIVE NEGATIVE Final    Comment: (NOTE) The Xpert Xpress SARS-CoV-2/FLU/RSV plus assay is intended as an aid in the diagnosis of influenza from Nasopharyngeal swab specimens and should not be used as a sole basis for treatment. Nasal washings and aspirates are unacceptable for Xpert Xpress SARS-CoV-2/FLU/RSV testing.  Fact Sheet for Patients: EntrepreneurPulse.com.au  Fact Sheet for Healthcare Providers: IncredibleEmployment.be  This test is not yet approved or cleared by the [place] FDA and has been authorized for detection and/or diagnosis of SARS-CoV-2 by FDA under an Emergency Use Authorization (EUA). This EUA will remain in effect (meaning this test can be used) for the  duration of the COVID-19 declaration under Section 564(b)(1) of the Act, 21 U.S.C. section 360bbb-3(b)(1), unless the authorization is terminated or revoked.  Performed at [facility], [address] [place], [place] [address]      Labs: BNP (last 3 results) Recent Labs    07/28/21 1221  BNP 211.9*   Basic Metabolic Panel: Recent Labs  Lab 09/05/21 1437 09/06/21 0503 09/07/21 0450 09/08/21 0509  NA 140 139 140 140  K 4.4 3.8 3.7 3.5  CL 94* 101 101 104  CO2 32 30 30 29   GLUCOSE 146* 87 96 94  BUN 36* 29* 22 16  CREATININE 1.36* 0.99 0.95 0.91  CALCIUM 9.9 8.8* 8.9 8.8*  MG  --   --  1.6* 1.7   Liver Function Tests: Recent Labs  Lab 09/05/21 1437  AST 26  ALT 8  ALKPHOS 124  BILITOT 1.4*  PROT 7.6  ALBUMIN 3.3*   No results for input(s): LIPASE, AMYLASE in the last 168 hours. No results for input(s): AMMONIA in the last 168 hours. CBC: Recent Labs  Lab 09/05/21 1437 09/06/21 0503 09/07/21 0450 09/08/21 0509  WBC 9.5 6.9 5.6 4.8  HGB 15.6 13.9 14.3 14.8  HCT 48.6 43.8 45.6 46.1  MCV 85.1 85.4 85.4 86.3  PLT 393 368 414* 409*   Cardiac Enzymes: No results for input(s): CKTOTAL, CKMB, CKMBINDEX, TROPONINI in the last 168 hours. BNP: Invalid input(s): POCBNP CBG: No results for input(s): GLUCAP in the last 168 hours. D-Dimer No results for input(s): DDIMER in the last 72 hours. Hgb A1c No results for input(s): HGBA1C in the last 72 hours. Lipid Profile No results for input(s): CHOL, HDL, LDLCALC, TRIG, CHOLHDL, LDLDIRECT in the last 72 hours. Thyroid function studies No results for input(s): TSH, T4TOTAL,  T3FREE, THYROIDAB in the last 72 hours.  Invalid input(s): FREET3 Anemia work up No results for input(s): VITAMINB12, FOLATE, FERRITIN, TIBC, IRON, RETICCTPCT in the last 72 hours. Urinalysis    Component Value Date/Time   COLORURINE YELLOW (A) 09/05/2021 2201   APPEARANCEUR CLEAR (A) 09/05/2021 2201   APPEARANCEUR Clear  09/25/2020 1314   LABSPEC 1.044 (H) 09/05/2021 2201   LABSPEC 1.011 11/17/2012 1216   PHURINE 7.0 09/05/2021 2201   GLUCOSEU NEGATIVE 09/05/2021 2201   GLUCOSEU Negative 11/17/2012 1216   HGBUR NEGATIVE 09/05/2021 2201   BILIRUBINUR NEGATIVE 09/05/2021 2201   BILIRUBINUR Negative 09/25/2020 1314   BILIRUBINUR Negative 11/17/2012 1216   KETONESUR NEGATIVE 09/05/2021 2201   PROTEINUR 30 (A) 09/05/2021 2201   NITRITE NEGATIVE 09/05/2021 2201   LEUKOCYTESUR NEGATIVE 09/05/2021 2201   LEUKOCYTESUR Negative 11/17/2012 1216   Sepsis Labs Invalid input(s): PROCALCITONIN,  WBC,  LACTICIDVEN Microbiology Recent Results (from the past 240 hour(s))  Resp Panel by RT-PCR (Flu A&B, Covid) Nasopharyngeal Swab     Status: None   Collection Time: 09/05/21  3:07 PM   Specimen: Nasopharyngeal Swab; Nasopharyngeal(NP) swabs in vial transport medium  Result Value Ref Range Status   SARS Coronavirus 2 by RT PCR NEGATIVE NEGATIVE Final    Comment: (NOTE) SARS-CoV-2 target nucleic acids are NOT DETECTED.  The SARS-CoV-2 RNA is generally detectable in upper respiratory specimens during the acute phase of infection. The lowest concentration of SARS-CoV-2 viral copies this assay can detect is 138 copies/mL. A negative result does not preclude SARS-Cov-2 infection and should not be used as the sole basis for treatment or other patient management decisions. A negative result may occur with  improper specimen collection/handling, submission of specimen other than nasopharyngeal swab, presence of viral mutation(s) within the areas targeted by this assay, and inadequate number of viral copies(<138 copies/mL). A negative result must be combined with clinical observations, patient history, and epidemiological information. The expected result is Negative.  Fact Sheet for Patients:  EntrepreneurPulse.com.au  Fact Sheet for Healthcare Providers:   IncredibleEmployment.be  This test is no t yet approved or cleared by the [place] FDA and  has been authorized for detection and/or diagnosis of SARS-CoV-2 by FDA under an Emergency Use Authorization (EUA). This EUA will remain  in effect (meaning this test can be used) for the duration of the COVID-19 declaration under Section 564(b)(1) of the Act, 21 U.S.C.section 360bbb-3(b)(1), unless the authorization is terminated  or revoked sooner.       Influenza A by PCR NEGATIVE NEGATIVE Final   Influenza B by PCR NEGATIVE NEGATIVE Final    Comment: (NOTE) The Xpert Xpress SARS-CoV-2/FLU/RSV plus assay is intended as an aid in the diagnosis of influenza from Nasopharyngeal swab specimens and should not be used as a sole basis for treatment. Nasal washings and aspirates are unacceptable for Xpert Xpress SARS-CoV-2/FLU/RSV testing.  Fact Sheet for Patients: EntrepreneurPulse.com.au  Fact Sheet for Healthcare Providers: IncredibleEmployment.be  This test is not yet approved or cleared by the [place] FDA and has been authorized for detection and/or diagnosis of SARS-CoV-2 by FDA under an Emergency Use Authorization (EUA). This EUA will remain in effect (meaning this test can be used) for the duration of the COVID-19 declaration under Section 564(b)(1) of the Act, 21 U.S.C. section 360bbb-3(b)(1), unless the authorization is terminated or revoked.  Performed at [facility], [address] [place], [place] [address]      Time coordinating discharge in minutes: 21  SIGNED:  Debbe Odea, MD  [facility] 09/10/2021, 10:03 AM

## 2021-09-13 ENCOUNTER — Encounter: Payer: Self-pay | Admitting: Nurse Practitioner

## 2021-09-13 ENCOUNTER — Non-Acute Institutional Stay: Payer: Medicare Other | Admitting: Nurse Practitioner

## 2021-09-13 VITALS — BP 136/76 | HR 82 | Temp 98.0°F | Resp 18 | Wt 130.0 lb

## 2021-09-13 DIAGNOSIS — E43 Unspecified severe protein-calorie malnutrition: Secondary | ICD-10-CM

## 2021-09-13 DIAGNOSIS — R531 Weakness: Secondary | ICD-10-CM

## 2021-09-13 DIAGNOSIS — G893 Neoplasm related pain (acute) (chronic): Secondary | ICD-10-CM

## 2021-09-13 DIAGNOSIS — Z515 Encounter for palliative care: Secondary | ICD-10-CM

## 2021-09-13 NOTE — Progress Notes (Signed)
Jacob [name]: [phone]  Fax: [phone]    Date of encounter: 09/13/21 9:55 PM PATIENT NAME: Jacob [name] Jacob [name] [id number]   [phone] (home)  DOB: 04-May-1934 MRN: [id number] PRIMARY CARE PROVIDER:   Dr [facility] Jacob Kayser, MD (Inactive),  Jacob [name] [id number] [phone]  RESPONSIBLE PARTY:    Contact Information     Name Relation Home Work [address]   Jacob, [name] Son   [phone]   Jacob [name]   [phone]      I met face to face with patient in facility. Palliative Care was asked to follow this patient by consultation request of  Dr [name] to address advance care planning and complex medical decision making. This is a follow up visit ASSESSMENT AND PLAN / RECOMMENDATIONS: Symptom Management/Plan: 1. Advance Care Planning; Full code  2. Chronic pain lower back secondary to metastatic prostate cancer, currently not receiving treatment; he does receive lidocain patch 4% q12 hrs; oxycodone 5/370m q4hrs as needed for pain. Continue to monitor on pain scale. Monitor efficacy vs adverse side effects  3. Generalized weakness secondary to deconditioning; protein calorie malnutrition; prostate CA; continue to encourage mobility, working with therapy, strengthening. Continue nutrition with supplemental support.   4. Goals of Care: Goals include to maximize quality of life and symptom management. Our advance care planning conversation included a discussion about:    The value and importance of advance care planning  Exploration of personal, cultural or spiritual beliefs that might influence medical decisions  Exploration of goals of care in the event of a sudden injury or illness  Identification and preparation of a healthcare agent  Review and updating or creation of an advance directive document.  5.  Palliative care encounter; Palliative care encounter; Palliative medicine team will continue to support patient, patient's family, and medical team. Visit consisted of counseling and education dealing with the complex and emotionally intense issues of symptom management and palliative care in the setting of serious and potentially life-threatening illness  6. f/u 2 weeks for ongoing monitoring chronic disease progression, ongoing discussions complex medical decision making  Follow up Palliative Care Visit: Palliative care will continue to follow for complex medical decision making, advance care planning, and clarification of goals. Return 2 weeks or prn.  I spent 37 minutes providing this consultation. More than 50% of the time in this consultation was spent in counseling and care coordination. PPS: 40%  Chief Complaint: Follow up palliative consult for complex medical decision making  HISTORY OF PRESENT ILLNESS:  Jacob SLAPEis a 87y.o. year old male  with multiple medical problems including prostate cancer with bone metastasis, BPH, COPD, chronic bronchitis, essential hypertension, CKD stage II, tremor, protein calorie malnutrition. Patient recently hospitalized 8/17-8/23/22 due to CAP, acute respiratory failure with hypoxia. Hospitalized 09/05/2021 to 09/10/2021 for generalized weakness, back pain with failure to thrive, poor functional and nutritional status. [facility]. Mr. GMcclimansresides at home as he has 4 sons checking on him 1 to 2 times a week. He was driving himself to his appointments prior to hospitalization. Mr. GBurandtwas requiring more assistance with adl's and thinking about moving to ALF as Mr. GDuzanfelt like he was requiring more help. [facility].  Jacob [name] does require assistnace for transfers, mobility to w/c. Jacob [name] is able to sit  in a w/c, propel himself. Jacob [name] is able to perform some adl's with assistance. Jacob [name] does feed himself. Appetite remains declined. Staff endorses no other changes. Jacob [name] remains a full code. At present, Jacob [name] is sitting in the w/c in his room. Mr [name] and I talked about purpose of PC visit, Jacob [name] in agreement as he was familiar with PC since he has been followed as a home palliative consult. Jacob [name] and I talked about how he has been feeling. Jacob [name] endorses he overall continues to decline. His back continues to hurt though the oxycodone does give him relief. We talked about lidoderm patches. We talked about therapy. We talked about his goals with therapy. We talked about mobility. We talked appetite appetite, declined, weight loss. We talked about nutrition, supplements. We talked about residing at [facility]. Jacob [name] endorses his wife does also reside at [name]. We talked about possible transition to LTC. Jacob [name] requested to further talk with [facility] SW; message relayed for SW to visit with concerns. We talked about PC to f/u, Jacob [name] in agreement. I updated staff, no new changes at this time. Will continue ongoing discussion of code status, goc, further planning.   History obtained from review of EMR, discussion with facility staff and Jacob [name].  I reviewed available labs, medications, imaging, studies and related documents from the EMR.  Records reviewed and summarized above.   ROS  Full 14 system review of systems performed and negative with exception of: as per HPI.   Physical Exam: Constitutional: NAD General: chronically ill, pleasant male EYES: lids intact ENMT: oral mucous membranes moist CV: S1S2, RRR Pulmonary: LCTA, no increased work of breathing, no cough Abdomen: normo-active BS + 4 quadrants, soft and non tender MSK: w/c  Skin: warm and dry, Neuro:  + generalized weakness,  no cognitive impairment Psych: non-anxious affect, A and O x 3  Questions and concerns were  addressed.  Provided general support and encouragement, no other unmet needs identified   Thank you for the opportunity to participate in the care of Mr. Difatta.  The palliative care team will continue to follow. Please call our office at [phone] if we can be of additional assistance.   This chart was dictated using voice recognition software.  Despite best efforts to proofread,  errors can occur which can change the documentation meaning.   Niza Soderholm Ihor Gully, NP

## 2021-09-14 ENCOUNTER — Other Ambulatory Visit: Payer: Self-pay

## 2021-09-15 ENCOUNTER — Other Ambulatory Visit: Payer: Self-pay | Admitting: Hospice and Palliative Medicine

## 2021-09-15 DIAGNOSIS — C61 Malignant neoplasm of prostate: Secondary | ICD-10-CM

## 2021-09-15 NOTE — Progress Notes (Signed)
Community palliative care at [facility]

## 2021-09-17 ENCOUNTER — Other Ambulatory Visit: Payer: Self-pay

## 2021-09-17 ENCOUNTER — Non-Acute Institutional Stay: Payer: Medicare Other | Admitting: Nurse Practitioner

## 2021-09-17 ENCOUNTER — Encounter: Payer: Self-pay | Admitting: Nurse Practitioner

## 2021-09-17 VITALS — BP 132/86 | HR 82 | Resp 18 | Wt 130.0 lb

## 2021-09-17 DIAGNOSIS — Z515 Encounter for palliative care: Secondary | ICD-10-CM

## 2021-09-17 DIAGNOSIS — R531 Weakness: Secondary | ICD-10-CM

## 2021-09-17 NOTE — Progress Notes (Signed)
[facility] Consult Note Telephone: [phone]  Fax: [phone]    Date of encounter: 09/17/21 2:52 PM PATIENT NAME: ALEJANDRA BARNA [place] [id number]   [phone] (home)  DOB: 1934/07/31 MRN: [id number] PRIMARY CARE PROVIDER:   Dr [facility] [name], MD (Inactive),  [place] [address] [phone] RESPONSIBLE PARTY:    Contact Information     Name Relation Home Work [address]   Mckinnon, Glick Son   [phone]   [place]   [phone]     I met face to face with patient in facility. Palliative Care was asked to follow this patient by consultation request of Dr [name] to address advance care planning and complex medical decision making. This is a follow up visit.                                  ASSESSMENT AND PLAN / RECOMMENDATIONS:  Symptom Management/Plan: 1. Advance Care Planning; Full code   2. Chronic pain lower back secondary to metastatic prostate cancer, currently not receiving treatment; he does receive lidocain patch 4% q12 hrs; oxycodone 5/379m q4hrs as needed for pain. Continue to monitor on pain scale. Monitor efficacy vs adverse side effects   3. Generalized weakness secondary to deconditioning; protein calorie malnutrition; prostate CA; continue to encourage mobility, working with therapy, strengthening. Continue nutrition with supplemental support.    4. Palliative care encounter; Palliative care encounter; Palliative medicine team will continue to support patient, patient's family, and medical team. Visit consisted of counseling and education dealing with the complex and emotionally intense issues of symptom management and palliative care in the setting of serious and potentially life-threatening illness   5. f/u 2 weeks for ongoing monitoring chronic disease progression, ongoing discussions complex medical  decision making Follow up Palliative Care Visit: Palliative care will continue to follow for complex medical decision making, advance care planning, and clarification of goals. Return 2 weeks or prn.  I spent 36 minutes providing this consultation start at 11:15am. More than 50% of the time in this consultation was spent in counseling and care coordination. PPS: 40%  Chief Complaint:  Follow up palliative consult for complex medical decision making HISTORY OF PRESENT ILLNESS:  RLONELL STAMOSis a 87y.o. year old male  with multiple medical problems including prostate cancer with bone metastasis, BPH, COPD, chronic bronchitis, essential hypertension, CKD stage II, tremor, protein calorie malnutrition. Patient recently hospitalized 8/17-8/23/22 due to CAP, acute respiratory failure with hypoxia. Hospitalized 09/05/2021 to 09/10/2021 for generalized weakness, back pain with failure to thrive, poor functional and nutritional status. [facility]. Mr. GQuastresides at home as he has 4 sons checking on him 1 to 2 times a week. He was driving himself to his appointments prior to hospitalization. Mr. GPuertaswas requiring more assistance with adl's and thinking about moving to ALF as Mr. GMachafelt like he was requiring more help. [facility] Mr. [facility]alskiis [facility]. Mr. GFarodoes require assistnace for transfers, mobility to w/c. Mr. GPokeis able to sit in a w/c, propel himself. Mr. GCarreirais able to perform some adl's with assistance. Mr. GSippdoes feed himself. Appetite remains declined. Staff endorses no other changes. Mr.  Pinkerton remains a full code. At present Mr. Polhamus is lying in bed, appears comfortable, debilitated, chronically ill. No visitors present. We talked about how he has been feeling. Mr. Muzquiz endorses he is okay, concerned that he is not able to return home as his skill level has  increased and unable to care for himself. We talked about symptoms of pain, shortness of breath, weakness, as Mr. Riegler endorses continues but does have pain relief. We talked about appetite which remains poor. We talked about appetite, weight. We talked about medical goals of care. We talked about the transition to LTC wishes but needs to know about the financial part. Requested [facility] SW to further discuss with Mr. Romas. We talked about therapy. We talked about role pc in poc. We talked about f/u pc visit. Therapeutic listening, emotional support provided. We talked about his 3 son's, Mr. Waymire endorses [name] his son helps him with decision making although all of his sons are involved. I updated staff, no changes today  History obtained from review of EMR, discussion with facility staff and Mr. Cambria.  I reviewed available labs, medications, imaging, studies and related documents from the EMR.  Records reviewed and summarized above.   ROS Full 14 system review of systems performed and negative with exception of: as per HPI.   Physical Exam: Constitutional: NAD General: frail appearing, thin, chronically ill pleasant male EYES:  lids intact ENMT: oral mucous membranes moist CV: S1S2, RRR Pulmonary: LCTA, no increased work of breathing, + cough Abdomen: normo-active BS + 4 quadrants, soft and non tender MSK: w/c dependent with muscle wasting  Skin: warm and dry Neuro:  + generalized weakness Psych: flat affect, A and O x 3 Questions and concerns were addressed. Provided general support and encouragement, no other unmet needs identified   Thank you for the opportunity to participate in the care of Mr. Verno.  The palliative care team will continue to follow. Please call our office at [phone] if we can be of additional assistance.   This chart was dictated using voice recognition software.  Despite best efforts to proofread,  errors can occur which can change the documentation meaning.    Meng Winterton Ihor Gully, NP

## 2021-09-20 ENCOUNTER — Other Ambulatory Visit (HOSPITAL_COMMUNITY): Payer: Self-pay

## 2021-09-21 ENCOUNTER — Other Ambulatory Visit (HOSPITAL_COMMUNITY): Payer: Self-pay

## 2021-09-21 ENCOUNTER — Telehealth: Payer: Self-pay

## 2021-09-21 MED ORDER — PREDNISONE 5 MG PO TABS: 5.0000 mg | ORAL_TABLET | Freq: Two times a day (BID) | ORAL | 2 refills | Status: AC

## 2021-09-21 MED ORDER — ABIRATERONE ACETATE 250 MG PO TABS: 1000.0000 mg | ORAL_TABLET | Freq: Every day | ORAL | 2 refills | Status: AC

## 2021-09-28 ENCOUNTER — Telehealth: Payer: Self-pay | Admitting: Pharmacist

## 2021-09-28 NOTE — Telephone Encounter (Signed)
Oral Chemotherapy Pharmacist Encounter   Spoke with [facility], they have both the prednisone and abiraterone on site for Jacob [name]. They will start administering both medications today 09/28/21. Dr. Janese Banks informed  Medication was fill at [facility] the filling pharmacy for medications at [facility].   Darl Pikes, PharmD, BCPS, BCOP, CPP Hematology/Oncology Clinical Pharmacist [facility]/AP Oral [facility] [phone]  09/28/2021 2:18 PM

## 2021-10-01 NOTE — Telephone Encounter (Signed)
Oral Chemotherapy Pharmacist Encounter   Received several calls today from [facility]. They reported that they had a Care Plan meeting with Mr. Whinery's son, who wanted his father to continue to receive physical therapy. While he is receiving physical therapy, they stated that the abiraterone was not covered and can not continue to be given. They said that Mr. Windom's son understood this would be the case if physical therapy was continued. They were unable to tell me how much more physical therapy Mr. Griffitts had left, but stated they would call be back with that information.   He received abiraterone from 09/28/21-09/30/21.   They also called to confirm the date/time of his next week, [facility] will schedule transportation to the cancer center and instruct the son to met him at his appt.    N. , PharmD, BCPS, BCOP, CPP Hematology/Oncology Clinical Pharmacist [facility] [phone]  10/01/2021 12:57 PM  

## 2021-10-05 ENCOUNTER — Inpatient Hospital Stay: Payer: Medicare Other | Admitting: Pharmacist

## 2021-10-05 ENCOUNTER — Other Ambulatory Visit: Payer: Self-pay

## 2021-10-05 ENCOUNTER — Encounter: Payer: Self-pay | Admitting: Oncology

## 2021-10-05 ENCOUNTER — Inpatient Hospital Stay: Payer: Medicare Other | Attending: Oncology

## 2021-10-05 ENCOUNTER — Inpatient Hospital Stay: Payer: Medicare Other

## 2021-10-05 ENCOUNTER — Inpatient Hospital Stay (HOSPITAL_BASED_OUTPATIENT_CLINIC_OR_DEPARTMENT_OTHER): Payer: Medicare Other | Admitting: Oncology

## 2021-10-05 VITALS — BP 106/75 | HR 83 | Temp 97.8°F | Resp 16

## 2021-10-05 DIAGNOSIS — C61 Malignant neoplasm of prostate: Secondary | ICD-10-CM

## 2021-10-05 DIAGNOSIS — R531 Weakness: Secondary | ICD-10-CM

## 2021-10-05 DIAGNOSIS — C7951 Secondary malignant neoplasm of bone: Secondary | ICD-10-CM

## 2021-10-05 DIAGNOSIS — Z79899 Other long term (current) drug therapy: Secondary | ICD-10-CM | POA: Diagnosis not present

## 2021-10-05 DIAGNOSIS — Z5181 Encounter for therapeutic drug level monitoring: Secondary | ICD-10-CM | POA: Diagnosis not present

## 2021-10-05 DIAGNOSIS — A4101 Sepsis due to Methicillin susceptible Staphylococcus aureus: Secondary | ICD-10-CM | POA: Diagnosis not present

## 2021-10-05 DIAGNOSIS — R0602 Shortness of breath: Secondary | ICD-10-CM | POA: Diagnosis not present

## 2021-10-05 DIAGNOSIS — N179 Acute kidney failure, unspecified: Secondary | ICD-10-CM

## 2021-10-05 DIAGNOSIS — Z79818 Long term (current) use of other agents affecting estrogen receptors and estrogen levels: Secondary | ICD-10-CM

## 2021-10-05 LAB — CBC WITH DIFFERENTIAL/PLATELET
Abs Immature Granulocytes: 0.24 10*3/uL — ABNORMAL HIGH (ref 0.00–0.07)
Basophils Absolute: 0.1 10*3/uL (ref 0.0–0.1)
Basophils Relative: 1 %
Eosinophils Absolute: 0 10*3/uL (ref 0.0–0.5)
Eosinophils Relative: 0 %
HCT: 49.3 % (ref 39.0–52.0)
Hemoglobin: 15.5 g/dL (ref 13.0–17.0)
Immature Granulocytes: 3 %
Lymphocytes Relative: 8 %
Lymphs Abs: 0.7 10*3/uL (ref 0.7–4.0)
MCH: 27.2 pg (ref 26.0–34.0)
MCHC: 31.4 g/dL (ref 30.0–36.0)
MCV: 86.6 fL (ref 80.0–100.0)
Monocytes Absolute: 0.6 10*3/uL (ref 0.1–1.0)
Monocytes Relative: 7 %
Neutro Abs: 6.9 10*3/uL (ref 1.7–7.7)
Neutrophils Relative %: 81 %
Platelets: 438 10*3/uL — ABNORMAL HIGH (ref 150–400)
RBC: 5.69 MIL/uL (ref 4.22–5.81)
RDW: 18.3 % — ABNORMAL HIGH (ref 11.5–15.5)
WBC: 8.5 10*3/uL (ref 4.0–10.5)
nRBC: 0.4 % — ABNORMAL HIGH (ref 0.0–0.2)

## 2021-10-05 LAB — COMPREHENSIVE METABOLIC PANEL
ALT: <5 U/L (ref 0–44)
AST: 27 U/L (ref 15–41)
Albumin: 3.3 g/dL — ABNORMAL LOW (ref 3.5–5.0)
Alkaline Phosphatase: 205 U/L — ABNORMAL HIGH (ref 38–126)
Anion gap: 10 (ref 5–15)
BUN: 51 mg/dL — ABNORMAL HIGH (ref 8–23)
CO2: 30 mmol/L (ref 22–32)
Calcium: 10 mg/dL (ref 8.9–10.3)
Chloride: 99 mmol/L (ref 98–111)
Creatinine, Ser: 1.72 mg/dL — ABNORMAL HIGH (ref 0.61–1.24)
GFR, Estimated: 38 mL/min — ABNORMAL LOW (ref >60)
Glucose, Bld: 96 mg/dL (ref 70–99)
Potassium: 4.5 mmol/L (ref 3.5–5.1)
Sodium: 139 mmol/L (ref 135–145)
Total Bilirubin: 1.5 mg/dL — ABNORMAL HIGH (ref 0.3–1.2)
Total Protein: 7.5 g/dL (ref 6.5–8.1)

## 2021-10-05 LAB — PSA: Prostatic Specific Antigen: 2382 ng/mL — ABNORMAL HIGH (ref 0.00–4.00)

## 2021-10-05 MED ORDER — SODIUM CHLORIDE 0.9 % IV SOLN
INTRAVENOUS | Status: DC
  Filled 2021-10-05 (×2): qty 250

## 2021-10-05 MED ORDER — LEUPROLIDE ACETATE (3 MONTH) 22.5 MG ~~LOC~~ KIT
22.5000 mg | PACK | Freq: Once | SUBCUTANEOUS | Status: AC
  Administered 2021-10-05: 22.5 mg via SUBCUTANEOUS
  Filled 2021-10-05: qty 22.5

## 2021-10-05 NOTE — Progress Notes (Signed)
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  Hyperemia may also suggest infection/proctitis.  This was followed by a colonoscopy on 01/06/2021 which showed a near obstructing mass in the rectum 1 cm from the anal verge.  Scope could not be traversed beyond the mass.  Biopsy was taken and was positive for adenocarcinoma.  Patient is here with his son today and reports that he can still move his bowels but sometimes he has constipation and sometimes diarrhea.  He does not have a good insight into his medical problems.  Denies any significant pain but does report occasional rectal discomfort. He is independent of his ADLs   Rectal biopsy showed adenocarcinoma poorly differentiated.  Tumor was positive for PSA and PSAP but negative for CK7, CK20 and CDX2.  Findings are consistent with adenocarcinoma from prostate origin.   Patient was started on Lupron.  PSA came downFrom 549 to a lowest value of 16 in May 2022 and then started going back up again.  PSA in August 2022 was 201.  Scan showed worsening bone metastases.  Plan was to start him on Zytiga however patient's living conditions at home were not considered safe to start  him on the drug.  Patient was eventually discharged from the hospital to long-term facility.  Interval history-patient was given Zytiga for a few daysAfter which it was stopped as there was a possibility of patient transitioning to short-term rehab and therefore Zytiga was not paid for.  Patient is here with his son today.  He is somewhat confused and appears slower to respond  ECOG PS- 2-3 Pain scale- 0 Opioid associated constipation- no  Review of systems- Review of Systems  Constitutional:  Positive for malaise/fatigue. Negative for chills, fever and weight loss.  HENT:  Negative for congestion, ear discharge and nosebleeds.   Eyes:  Negative for blurred vision.  Respiratory:  Negative for cough, hemoptysis, sputum production, shortness of breath and wheezing.   Cardiovascular:  Negative for chest pain, palpitations, orthopnea and claudication.  Gastrointestinal:  Negative for abdominal pain, blood in stool, constipation, diarrhea, heartburn, melena, nausea and vomiting.  Genitourinary:  Negative for dysuria, flank pain, frequency, hematuria and urgency.  Musculoskeletal:  Positive for back pain. Negative for joint pain and myalgias.  Skin:  Negative for rash.  Neurological:  Negative for dizziness, tingling, focal weakness, seizures, weakness and headaches.  Endo/Heme/Allergies:  Does not bruise/bleed easily.  Psychiatric/Behavioral:  Negative for depression and suicidal ideas. The patient does not have insomnia.     No Known Allergies   Past Medical History:  Diagnosis Date   BPH (benign prostatic hyperplasia)    Cancer (HCC)    Chronic constipation 06/25/2015   COPD (chronic obstructive pulmonary disease) ([place])    pt denies.   Family  history of breast cancer    Family history of prostate cancer    Hypertension    Swelling of lower extremity    bilateral   Wears dentures    full upper and lower - do not fit well.     Past Surgical History:  Procedure Laterality Date    CATARACT EXTRACTION W/PHACO Right 09/30/2015   Procedure: CATARACT EXTRACTION PHACO AND INTRAOCULAR LENS PLACEMENT (IOC);  Surgeon: Leandrew Koyanagi, MD;  Location: [facility];  Service: Ophthalmology;  Laterality: Right;   COLONOSCOPY  2009?   COLONOSCOPY WITH PROPOFOL N/A 01/06/2021   Procedure: COLONOSCOPY WITH PROPOFOL;  Surgeon: Lin Landsman, MD;  Location: [facility] ENDOSCOPY;  Service: Gastroenterology;  Laterality: N/A;   HERNIA REPAIR Right    x2     Social History   Socioeconomic History   Marital status: Married    Spouse name: Not on file   Number of children: Not on file   Years of education: Not on file   Highest education level: Not on file  Occupational History   Not on file  Tobacco Use   Smoking status: Every Day    Packs/day: 0.25    Years: 45.00    Pack years: 11.25    Types: Cigarettes   Smokeless tobacco: Never  Vaping Use   Vaping Use: Never used  Substance and Sexual Activity   Alcohol use: Yes    Comment: drink beer maybe 2 - 3 weeks   Drug use: No   Sexual activity: Not on file  Other Topics Concern   Not on file  Social History Narrative   Not on file   Social Determinants of Health   Financial Resource Strain: Not on file  Food Insecurity: Not on file  Transportation Needs: Not on file  Physical Activity: Not on file  Stress: Not on file  Social Connections: Not on file  Intimate Partner Violence: Not on file    Family History  Problem Relation Age of Onset   Prostate cancer Brother    Alzheimer's disease Mother    Breast cancer Mother        dx 50 or 27   Stroke Father    Heart attack Daughter    Kidney cancer Neg Hx    Kidney disease Neg Hx    Bladder Cancer Neg Hx      Current Outpatient Medications:    abiraterone acetate (ZYTIGA) 250 MG tablet, Take 4 tablets (1,000 mg total) by mouth daily. Take on an empty stomach 1 hour before or 2 hours after a meal, Disp: 120 tablet, Rfl: 2   acetaminophen (TYLENOL)  500 MG tablet, Take 500 mg by mouth every 4 (four) hours as needed., Disp: , Rfl:    amitriptyline (ELAVIL) 10 MG tablet, 20 mg at bedtime., Disp: , Rfl:    amLODipine (NORVASC) 10 MG tablet, Take 5 mg by mouth daily., Disp: , Rfl:    aspirin 81 MG tablet, Take 81 mg by mouth daily. PM, Disp: , Rfl:    carbidopa-levodopa (SINEMET IR) 25-100 MG tablet, Take 1 tablet by mouth 3 (three) times daily., Disp: , Rfl:    cetirizine (ZYRTEC) 10 MG tablet, Take 10 mg by mouth daily as needed., Disp: , Rfl:    cyanocobalamin 500 MCG tablet, Take 500 mcg by mouth daily. PM, Disp: , Rfl:    feeding supplement (ENSURE ENLIVE / ENSURE PLUS) LIQD, Take 237 mLs by mouth 3 (three) times daily between meals., Disp: 21330 mL,  Rfl: 0   finasteride (PROSCAR) 5 MG tablet, TAKE 1 TABLET BY MOUTH EVERY DAY, Disp: 90 tablet, Rfl: 3   gabapentin (NEURONTIN) 100 MG capsule, Take 100 mg by mouth 2 (two) times daily., Disp: , Rfl:    lidocaine (LIDODERM) 5 %, Place 2 patches onto the skin daily. Apply to lower back. Remove & Discard patch within 12 hours or as directed by MD, Disp: 30 patch, Rfl: 0   magnesium oxide (MAG-OX) 400 (240 Mg) MG tablet, Take 1 tablet (400 mg total) by mouth 2 (two) times daily., Disp: , Rfl:    metoprolol succinate (TOPROL-XL) 50 MG 24 hr tablet, Take 50 mg by mouth daily., Disp: , Rfl:    mirtazapine (REMERON) 7.5 MG tablet, Take 7.5 mg by mouth at bedtime. (Patient not taking: Reported on 09/06/2021), Disp: , Rfl:    Multiple Vitamin (MULTIVITAMIN WITH MINERALS) TABS tablet, Take 1 tablet by mouth daily., Disp: , Rfl:    oxyCODONE-acetaminophen (PERCOCET/ROXICET) 5-325 MG tablet, Give 1 tab Q4 PRN for moderate pain and 2 tabs Q4 PRN for severe pain., Disp: 30 tablet, Rfl: 0   polyethylene glycol (MIRALAX / GLYCOLAX) 17 g packet, Take 17 g by mouth daily., Disp: 14 each, Rfl: 0   predniSONE (DELTASONE) 5 MG tablet, Take 1 tablet (5 mg total) by mouth 2 (two) times daily with a meal. To be taken while  patient is on abiraterone therapy., Disp: 60 tablet, Rfl: 2   Propylene Glycol (SYSTANE BALANCE) 0.6 % SOLN, INSTILL 3 5 TIMES A DAY IN EACH EYE AS NEEDED FOR DRYNESS., Disp: , Rfl:    tamsulosin (FLOMAX) 0.4 MG CAPS capsule, TAKE 1 CAPSULE BY MOUTH EVERY DAY, Disp: 90 capsule, Rfl: 3 No current facility-administered medications for this visit.  Facility-Administered Medications Ordered in Other Visits:    0.9 %  sodium chloride infusion, , Intravenous, Continuous, Sindy Guadeloupe, MD, Stopped at 10/05/21 1312  Physical exam:  Vitals:   10/05/21 1134  BP: 106/75  Pulse: 83  Resp: 16  Temp: 97.8 F (36.6 C)  TempSrc: Tympanic  SpO2: 95%   Physical Exam Eyes:     Pupils: Pupils are equal, round, and reactive to light.  Cardiovascular:     Rate and Rhythm: Normal rate and regular rhythm.     Heart sounds: Normal heart sounds.  Pulmonary:     Effort: Pulmonary effort is normal.     Breath sounds: Normal breath sounds.  Abdominal:     General: Bowel sounds are normal.     Palpations: Abdomen is soft.  Skin:    General: Skin is warm and dry.  Neurological:     Mental Status: He is alert.     Comments: Oriented to self.  Appears intermittently confused     CMP Latest Ref Rng & Units 10/05/2021  Glucose 70 - 99 mg/dL 96  BUN 8 - 23 mg/dL 51(H)  Creatinine 0.61 - 1.24 mg/dL 1.72(H)  Sodium 135 - 145 mmol/L 139  Potassium 3.5 - 5.1 mmol/L 4.5  Chloride 98 - 111 mmol/L 99  CO2 22 - 32 mmol/L 30  Calcium 8.9 - 10.3 mg/dL 10.0  Total Protein 6.5 - 8.1 g/dL 7.5  Total Bilirubin 0.3 - 1.2 mg/dL 1.5(H)  Alkaline Phos 38 - 126 U/L 205(H)  AST 15 - 41 U/L 27  ALT 0 - 44 U/L <5   CBC Latest Ref Rng & Units 10/05/2021  WBC 4.0 - 10.5 K/uL 8.5  Hemoglobin 13.0 - 17.0  g/dL 15.5  Hematocrit 39.0 - 52.0 % 49.3  Platelets 150 - 400 K/uL 438(H)    No images are attached to the encounter.  MR THORACIC SPINE WO CONTRAST  Result Date: 09/05/2021 CLINICAL DATA:  Mid back pain. Three  weeks of severe back pain, history of metastatic prostate cancer. EXAM: MRI THORACIC SPINE WITHOUT CONTRAST TECHNIQUE: Multiplanar, multisequence MR imaging of the thoracic spine was performed. No intravenous contrast was administered. COMPARISON:  CT angiogram chest 09/05/2021. FINDINGS: Mild-to-moderate intermittent motion degradation. Alignment: No significant spondylolisthesis. Thoracic dextrocurvature. Vertebrae: There is multifocal marrow signal abnormality throughout the thoracic spine compatible with diffuse osseous metastatic disease. Mild T9 vertebral compression fracture, (approximately 20% height loss), unchanged as compared to the same-day chest CT of 09/05/2021. Subtle T10 superior endplate deformity with minimal height loss. Vertebral body height is otherwise maintained. Cord: Within the limitations of motion degradation, no spinal cord signal abnormality is identified. Paraspinal and other soft tissues: Trace bilateral pleural effusions. Redemonstrated hepatic cyst. Polycystic kidneys, incompletely imaged. Paraspinal soft tissues unremarkable. Disc levels: Multilevel mild-to-moderate disc degeneration. There are multilevel disc bulges. However, there is no significant focal disc herniation or spinal canal stenosis. No compressive bony neural foraminal narrowing. IMPRESSION: Motion degraded exam. Diffuse osseous metastatic disease throughout the thoracic spine. Mild T9 superior endplate vertebral compression fracture (20% height loss), unchanged as compared to the chest CT performed earlier today. This may reflect a pathologic fracture. Subtle T10 superior endplate deformity with minimal height loss. Thoracic spondylosis, as described. No significant spinal canal stenosis or compressive foraminal narrowing. Electronically Signed   By: Kellie Simmering D.O.   On: 09/05/2021 18:36   MR LUMBAR SPINE WO CONTRAST  Result Date: 09/05/2021 CLINICAL DATA:  Low back pain, cancer suspected. Three week history  of severe back pain with history of metastatic prostate cancer. EXAM: MRI LUMBAR SPINE WITHOUT CONTRAST TECHNIQUE: Multiplanar, multisequence MR imaging of the lumbar spine was performed. No intravenous contrast was administered. COMPARISON:  CT abdomen/pelvis 09/05/2021. FINDINGS: Segmentation: 5 lumbar vertebrae. The caudal most well-formed intervertebral disc space is designated L5-S1. Alignment: Straightening of the expected lumbar lordosis. Trace L3-L4 grade 1 anterolisthesis. Lower lumbar dextrocurvature. Vertebrae: There is multifocal signal abnormality within the lumbar spine, visualized lower thoracic spine, sacrum and pelvis compatible with diffuse osseous metastatic disease. L4 superior endplate compression deformity (30% height loss) unchanged as compared to the CT abdomen/pelvis of 09/05/2021. Vertebral body height is otherwise maintained. Conus medullaris and cauda equina: Conus extends to the T12-L1 level. No signal abnormality within the visualized distal spinal cord. Paraspinal and other soft tissues: Polycystic kidneys and bulky retroperitoneal lymphadenopathy more fully characterized on the prior CT abdomen/pelvis of 09/05/2021. Disc levels: Multilevel disc degeneration. Most notably, there is moderate to moderately advanced disc degeneration at T12-L1, L4-L5 and L5-S1. T12-L1: Disc bulge with endplate spurring. No significant spinal canal or foraminal stenosis. L1-L2: Disc bulge with endplate spurring. No significant spinal canal or foraminal stenosis. L2-L3: Trace grade 1 anterolisthesis. Disc bulge with endplate spurring. Mild facet arthrosis. No significant spinal canal or foraminal stenosis. L3-L4: Trace bony retropulsion at the level of the L4 superior endplate. Disc bulge with endplate spurring. Facet arthrosis. Bilateral subarticular stenosis with potential to affect either descending L4 nerve root. Moderate to moderately severe central canal stenosis. Mild bilateral neural foraminal  narrowing (greater on the right). L4-L5: Disc bulge with endplate spurring. Superimposed broad-based right center to right foraminal disc protrusion. Facet arthrosis. The disc protrusion contributes to right subarticular stenosis, encroaching upon the  descending right L5 nerve root. Mild central canal stenosis. No significant foraminal narrowing. L5-S1: Disc bulge with endplate spurring. Facet arthrosis with ligamentum flavum hypertrophy. Mild bilateral subarticular narrowing with slight crowding of the descending S1 nerve roots. Central canal patent. No significant foraminal stenosis. IMPRESSION: Diffuse osseous metastatic disease within the lumbar spine and visualized lower thoracic spine, sacrum and pelvis. L4 superior endplate compression fracture (30% height loss), unchanged as compared to the CT abdomen/pelvis of 09/05/2021. This may reflect a pathologic fracture. Lumbar spondylosis, as outlined and with findings most notably as follows. At L3-L4, there is multifactorial bilateral subarticular stenosis with potential to affect either descending L4 nerve root. Moderate to moderately advanced central canal stenosis. Mild bilateral neural foraminal narrowing. At L4-L5, there is a broad-based right center to right foraminal disc protrusion contributing to right subarticular stenosis and encroaching upon the descending right L5 nerve root. Mild relative narrowing of the central canal. At L5-S1, there is multifactorial mild bilateral subarticular narrowing with slight crowding of the descending S1 nerve roots. Lower lumbar dextrocurvature. Trace grade 1 anterolisthesis at L3-L4. Polycystic kidneys and bulky retroperitoneal lymphadenopathy more fully characterized on the prior CT abdomen/pelvis. Electronically Signed   By: Kellie Simmering D.O.   On: 09/05/2021 18:29     Assessment and plan- Patient is a 87 y.o. 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  He is supposed to be starting [place] which was approved by insurance but this cannot be givenIf he is considering subacute rehab.  I explained this to the patient's son that he needs to be transition to a long-term care before Zytiga can be restarted.  He is wanting to reach out to nursing facility at this time and he is willing to forego any kind of subacute rehab that would prevent the patient from getting Zytiga.  PSA from today is pending.  AKI: Patient's creatinine is elevated at 1.7 today and we will give him 1 L of IV fluids.  He will come back for more IV fluids later this week as well as next week and I will see him back in 3 weeks.   Bone metastases: I will start him on Zometa or Xgeva in 3 weeks time  Acute encephalopathy: It is unclear if patient's confusion today is related to his acute kidney injury as well as borderline hypercalcemia: His mental status did seem to improve after we gave him IV fluids.  I wanted to check his UA but we were unable to collect a sample.  We will reattempt urinalysis later this week.  We will also reach out to his nursing facility to make sure that his hydration can be maintained  visit Diagnosis 1. High risk medication use   2. Prostate cancer metastatic to bone ([place])   3. AKI (acute kidney injury) ([place])   4. Encounter for monitoring Lupron therapy      Dr. Randa Evens, MD, MPH [facility] [phone] 10/05/2021 4:11 PM

## 2021-10-05 NOTE — Progress Notes (Signed)
Attempted to obtain urine sample for UA/UC via in and out cath. Unable to pass catheter past prostate, pt verbalized discomfort. Procedure terminated.

## 2021-10-05 NOTE — Progress Notes (Signed)
Pt presents from SNF in a wheelchair, and appears lethargic and confused. Son states that this is not pt's baseline. Son states that he is not sure if pt is eating and drinking well in the SNF, but reports that a lot of times, his food is still sitting on the tray when he visits.

## 2021-10-05 NOTE — Progress Notes (Signed)
[place]  Telephone:([phone] [phone]  [name] Care Team: [name], MD (Inactive) as PCP - General (Internal Medicine) Clent Jacks, RN as Oncology Nurse Navigator   Name of Jacob [name]: Jacob [name]  [id number]  11-Dec-1934   Date of visit: 10/05/21  HPI: [name] is a 87 y.o. male with metastatic castrate resistant prostate cancer. Plan to added Zytiga (abiraterone) and prednisone onto his ongoing ADT injections.   Jacob [name] currently lives at [facility] and was coordinated to start his treatment last week. He received abiraterone from 09/28/21-09/30/21, but we received a call at Jacob office stating that because Jacob [name]'s son wanted his father to receive physical therapy he was not able to also receive Jacob abiraterone and treatment was stopped. (This is likely related to Jacob Medicare status of rehab vs long term care)  Reason for Consult: Abiraterone oral chemotherapy education.   PAST MEDICAL HISTORY: Past Medical History:  Diagnosis Date   BPH (benign prostatic hyperplasia)    Cancer (HCC)    Chronic constipation 06/25/2015   COPD (chronic obstructive pulmonary disease) ([place])    pt denies.   Family history of breast cancer    Family history of prostate cancer    Hypertension    Swelling of lower extremity    bilateral   Wears dentures    full upper and lower - do not fit well.    HEMATOLOGY/ONCOLOGY HISTORY:  Oncology History  Prostate cancer metastatic to bone ([place])  01/14/2021 Initial Diagnosis   Prostate cancer ([place])   04/11/2021 Cancer Staging   Staging form: Prostate, AJCC 8th Edition - Clinical stage from 04/11/2021: Stage IVB (cT4, cNX, cM1, PSA: 549) - Signed by Sindy Guadeloupe, MD on 04/17/2021 Prostate specific antigen (PSA) range: 20 or greater      ALLERGIES:  has No Known Allergies.  MEDICATIONS:  Current Outpatient Medications  Medication Sig Dispense Refill    abiraterone acetate (ZYTIGA) 250 MG tablet Take 4 tablets (1,000 mg total) by mouth daily. Take on an empty stomach 1 hour before or 2 hours after a meal 120 tablet 2   acetaminophen (TYLENOL) 500 MG tablet Take 500 mg by mouth every 4 (four) hours as needed.     amitriptyline (ELAVIL) 10 MG tablet 20 mg at bedtime.     amLODipine (NORVASC) 10 MG tablet Take 5 mg by mouth daily.     aspirin 81 MG tablet Take 81 mg by mouth daily. PM     carbidopa-levodopa (SINEMET IR) 25-100 MG tablet Take 1 tablet by mouth 3 (three) times daily.     cetirizine (ZYRTEC) 10 MG tablet Take 10 mg by mouth daily as needed.     cyanocobalamin 500 MCG tablet Take 500 mcg by mouth daily. PM     feeding supplement (ENSURE ENLIVE / ENSURE PLUS) LIQD Take 237 mLs by mouth 3 (three) times daily between meals. 21330 mL 0   finasteride (PROSCAR) 5 MG tablet TAKE 1 TABLET BY MOUTH EVERY DAY 90 tablet 3   gabapentin (NEURONTIN) 100 MG capsule Take 100 mg by mouth 2 (two) times daily.     lidocaine (LIDODERM) 5 % Place 2 patches onto Jacob skin daily. Apply to lower back. Remove & Discard patch within 12 hours or as directed by MD 30 patch 0   magnesium oxide (MAG-OX) 400 (240 Mg) MG tablet Take 1 tablet (400 mg total) by mouth 2 (two) times daily.  metoprolol succinate (TOPROL-XL) 50 MG 24 hr tablet Take 50 mg by mouth daily.     mirtazapine (REMERON) 7.5 MG tablet Take 7.5 mg by mouth at bedtime. ([name] not taking: Reported on 09/06/2021)     Multiple Vitamin (MULTIVITAMIN WITH MINERALS) TABS tablet Take 1 tablet by mouth daily.     oxyCODONE-acetaminophen (PERCOCET/ROXICET) 5-325 MG tablet Give 1 tab Q4 PRN for moderate pain and 2 tabs Q4 PRN for severe pain. 30 tablet 0   polyethylene glycol (MIRALAX / GLYCOLAX) 17 g packet Take 17 g by mouth daily. 14 each 0   predniSONE (DELTASONE) 5 MG tablet Take 1 tablet (5 mg total) by mouth 2 (two) times daily with a meal. To be taken while [name] is on abiraterone therapy. 60 tablet  2   Propylene Glycol (SYSTANE BALANCE) 0.6 % SOLN INSTILL 3 5 TIMES A DAY IN EACH EYE AS NEEDED FOR DRYNESS.     tamsulosin (FLOMAX) 0.4 MG CAPS capsule TAKE 1 CAPSULE BY MOUTH EVERY DAY 90 capsule 3   No current facility-administered medications for this visit.   Facility-Administered Medications Ordered in Other Visits  Medication Dose Route Frequency Provider Last Rate Last Admin   0.9 %  sodium chloride infusion   Intravenous Continuous Sindy Guadeloupe, MD   Stopped at 10/05/21 1312    VITAL SIGNS: There were no vitals taken for this visit. There were no vitals filed for this visit.  Estimated body mass index is 17.63 kg/m as calculated from Jacob following:   Height as of 09/05/21: 6' (1.829 m).   Weight as of 09/17/21: 59 kg (130 lb).  LABS: CBC:    Component Value Date/Time   WBC 8.5 10/05/2021 1036   HGB 15.5 10/05/2021 1036   HGB 15.9 11/17/2012 1052   HCT 49.3 10/05/2021 1036   HCT 48.8 11/17/2012 1052   PLT 438 (H) 10/05/2021 1036   PLT 305 11/17/2012 1052   MCV 86.6 10/05/2021 1036   MCV 89 11/17/2012 1052   NEUTROABS 6.9 10/05/2021 1036   LYMPHSABS 0.7 10/05/2021 1036   MONOABS 0.6 10/05/2021 1036   EOSABS 0.0 10/05/2021 1036   BASOSABS 0.1 10/05/2021 1036   Comprehensive Metabolic Panel:    Component Value Date/Time   NA 139 10/05/2021 1036   NA 138 11/17/2012 1052   K 4.5 10/05/2021 1036   K 3.7 11/18/2012 0451   CL 99 10/05/2021 1036   CL 103 11/17/2012 1052   CO2 30 10/05/2021 1036   CO2 27 11/17/2012 1052   BUN 51 (H) 10/05/2021 1036   BUN 11 11/17/2012 1052   CREATININE 1.72 (H) 10/05/2021 1036   CREATININE 1.05 11/17/2012 1052   GLUCOSE 96 10/05/2021 1036   GLUCOSE 132 (H) 11/17/2012 1052   CALCIUM 10.0 10/05/2021 1036   CALCIUM 9.1 11/17/2012 1052   AST 27 10/05/2021 1036   AST 25 11/17/2012 1052   ALT <5 10/05/2021 1036   ALT 17 11/17/2012 1052   ALKPHOS 205 (H) 10/05/2021 1036   ALKPHOS 79 11/17/2012 1052   BILITOT 1.5 (H) 10/05/2021  1036   BILITOT 0.8 11/17/2012 1052   PROT 7.5 10/05/2021 1036   PROT 7.5 11/17/2012 1052   ALBUMIN 3.3 (L) 10/05/2021 1036   ALBUMIN 3.6 11/17/2012 1052     Present during today's visit: [name] and son Jacob [name]  Assessment and Plan: Start plan: Jacob [name] plans on going to [facility] and removing his request of physical therapy so that his father can receive his  abiraterone. Will have to f/u with [facility] on abiraterone start plan once this is completed.   [name] Education I spoke with [name]'s son Jacob [name] for overview of new oral chemotherapy medication: abiraterone  Administration: Jacob [employer] on administration, dosing, side effects, monitoring, drug-food interactions, safe handling, storage, and disposal. [name] will take: Abiraterone: Take 4 tablets (1,000 mg total) by mouth daily. Take on an empty stomach 1 hour before or 2 hours after a meal. Prednisone: Take 1 tablet (5 mg total) by mouth 2 (two) times daily with a meal. To be taken while [name] is on abiraterone therapy.  Side Effects: Side effects include but not limited to: blood pressure increase, fatigue, decrease in wbc.   Fatigue: Suggested that Jacob [name] monitor his father for an increase in fatigue when he visit him   Adherence: After discussion with Jacob [name] no [name] barriers to medication adherence identified because he will receive his medication from Jacob staff at [facility]. Reviewed with Jacob [name] importance of keeping a medication schedule and plan for any missed doses.  Jacob [name] voiced understanding and appreciation. All questions answered. Medication handout provided.  Provided Jacob [name] with [facility] phone number. Jacob [name] knows to call Jacob office with questions or concerns. [facility] will continue to follow.  Jacob [name] expressed understanding and was in agreement with this plan. He also understands that He can call clinic at any time  with any questions, concerns, or complaints.   Medication Access Issues: No issues, abiraterone and prednisone are able to be filled through Jacob dispensing pharmacy for [facility]  Follow-up plan: RTC in 3 weeks  Thank you for allowing me to participate in Jacob care of this [name].   Time Total: 20 min  Visit consisted of counseling and education on dealing with issues of symptom management in Jacob setting of serious and potentially life-threatening illness.Greater than 50%  of this time was spent counseling and coordinating care related to Jacob above assessment and plan.  Signed by: Darl Pikes, PharmD, BCPS, Salley Slaughter, CPP Hematology/Oncology Clinical Pharmacist Practitioner [facility]/DB/AP [facility] [place] [phone]  10/05/2021 4:31 PM

## 2021-10-05 NOTE — Patient Instructions (Signed)

## 2021-10-06 ENCOUNTER — Telehealth: Payer: Self-pay | Admitting: Oncology

## 2021-10-06 LAB — TESTOSTERONE: Testosterone: 6 ng/dL — ABNORMAL LOW (ref 264–916)

## 2021-10-06 NOTE — Telephone Encounter (Signed)
Attempt made to reach patient to confirm appointments. No answer and the mailbox is full.

## 2021-10-07 ENCOUNTER — Other Ambulatory Visit: Payer: Self-pay

## 2021-10-07 ENCOUNTER — Inpatient Hospital Stay
Admission: EM | Admit: 2021-10-07 | Discharge: 2021-10-12 | DRG: 871 | Disposition: E | Payer: Medicare Other | Source: Skilled Nursing Facility | Attending: Internal Medicine | Admitting: Internal Medicine

## 2021-10-07 ENCOUNTER — Encounter: Payer: Self-pay | Admitting: Emergency Medicine

## 2021-10-07 ENCOUNTER — Emergency Department: Payer: Medicare Other

## 2021-10-07 DIAGNOSIS — R64 Cachexia: Secondary | ICD-10-CM | POA: Diagnosis present

## 2021-10-07 DIAGNOSIS — R627 Adult failure to thrive: Secondary | ICD-10-CM | POA: Diagnosis present

## 2021-10-07 DIAGNOSIS — C61 Malignant neoplasm of prostate: Secondary | ICD-10-CM | POA: Diagnosis present

## 2021-10-07 DIAGNOSIS — C7951 Secondary malignant neoplasm of bone: Secondary | ICD-10-CM | POA: Diagnosis present

## 2021-10-07 DIAGNOSIS — Z923 Personal history of irradiation: Secondary | ICD-10-CM

## 2021-10-07 DIAGNOSIS — Z803 Family history of malignant neoplasm of breast: Secondary | ICD-10-CM

## 2021-10-07 DIAGNOSIS — Z8249 Family history of ischemic heart disease and other diseases of the circulatory system: Secondary | ICD-10-CM

## 2021-10-07 DIAGNOSIS — I10 Essential (primary) hypertension: Secondary | ICD-10-CM | POA: Diagnosis present

## 2021-10-07 DIAGNOSIS — Z8042 Family history of malignant neoplasm of prostate: Secondary | ICD-10-CM | POA: Diagnosis not present

## 2021-10-07 DIAGNOSIS — E785 Hyperlipidemia, unspecified: Secondary | ICD-10-CM | POA: Diagnosis present

## 2021-10-07 DIAGNOSIS — G20 Parkinson's disease: Secondary | ICD-10-CM | POA: Diagnosis present

## 2021-10-07 DIAGNOSIS — A4101 Sepsis due to Methicillin susceptible Staphylococcus aureus: Principal | ICD-10-CM | POA: Diagnosis present

## 2021-10-07 DIAGNOSIS — F17210 Nicotine dependence, cigarettes, uncomplicated: Secondary | ICD-10-CM | POA: Diagnosis present

## 2021-10-07 DIAGNOSIS — Z66 Do not resuscitate: Secondary | ICD-10-CM | POA: Diagnosis present

## 2021-10-07 DIAGNOSIS — Z515 Encounter for palliative care: Secondary | ICD-10-CM | POA: Diagnosis not present

## 2021-10-07 DIAGNOSIS — N179 Acute kidney failure, unspecified: Secondary | ICD-10-CM | POA: Diagnosis present

## 2021-10-07 DIAGNOSIS — G629 Polyneuropathy, unspecified: Secondary | ICD-10-CM | POA: Diagnosis present

## 2021-10-07 DIAGNOSIS — L8990 Pressure ulcer of unspecified site, unspecified stage: Secondary | ICD-10-CM | POA: Diagnosis present

## 2021-10-07 DIAGNOSIS — Z681 Body mass index (BMI) 19 or less, adult: Secondary | ICD-10-CM

## 2021-10-07 DIAGNOSIS — Z20822 Contact with and (suspected) exposure to covid-19: Secondary | ICD-10-CM | POA: Diagnosis present

## 2021-10-07 DIAGNOSIS — R0602 Shortness of breath: Secondary | ICD-10-CM | POA: Diagnosis present

## 2021-10-07 DIAGNOSIS — C785 Secondary malignant neoplasm of large intestine and rectum: Secondary | ICD-10-CM | POA: Diagnosis present

## 2021-10-07 DIAGNOSIS — N400 Enlarged prostate without lower urinary tract symptoms: Secondary | ICD-10-CM | POA: Diagnosis present

## 2021-10-07 DIAGNOSIS — J9601 Acute respiratory failure with hypoxia: Secondary | ICD-10-CM | POA: Diagnosis present

## 2021-10-07 DIAGNOSIS — L89313 Pressure ulcer of right buttock, stage 3: Secondary | ICD-10-CM | POA: Diagnosis present

## 2021-10-07 DIAGNOSIS — L89323 Pressure ulcer of left buttock, stage 3: Secondary | ICD-10-CM | POA: Diagnosis present

## 2021-10-07 DIAGNOSIS — J9600 Acute respiratory failure, unspecified whether with hypoxia or hypercapnia: Secondary | ICD-10-CM | POA: Diagnosis present

## 2021-10-07 DIAGNOSIS — A419 Sepsis, unspecified organism: Secondary | ICD-10-CM | POA: Diagnosis present

## 2021-10-07 LAB — PROCALCITONIN: Procalcitonin: 2.93 ng/mL

## 2021-10-07 LAB — BLOOD GAS, VENOUS
Acid-Base Excess: 2.5 mmol/L — ABNORMAL HIGH (ref 0.0–2.0)
Bicarbonate: 30.4 mmol/L — ABNORMAL HIGH (ref 20.0–28.0)
O2 Saturation: 55.5 %
Patient temperature: 37
pCO2, Ven: 59 mmHg (ref 44.0–60.0)
pH, Ven: 7.32 (ref 7.250–7.430)
pO2, Ven: 32 mmHg (ref 32.0–45.0)

## 2021-10-07 LAB — RESP PANEL BY RT-PCR (FLU A&B, COVID) ARPGX2
Influenza A by PCR: NEGATIVE
Influenza B by PCR: NEGATIVE
SARS Coronavirus 2 by RT PCR: NEGATIVE

## 2021-10-07 LAB — PROTIME-INR
INR: 1.4 — ABNORMAL HIGH (ref 0.8–1.2)
Prothrombin Time: 17.2 seconds — ABNORMAL HIGH (ref 11.4–15.2)

## 2021-10-07 LAB — COMPREHENSIVE METABOLIC PANEL
ALT: 10 U/L (ref 0–44)
AST: 35 U/L (ref 15–41)
Albumin: 3.1 g/dL — ABNORMAL LOW (ref 3.5–5.0)
Alkaline Phosphatase: 182 U/L — ABNORMAL HIGH (ref 38–126)
Anion gap: 12 (ref 5–15)
BUN: 60 mg/dL — ABNORMAL HIGH (ref 8–23)
CO2: 28 mmol/L (ref 22–32)
Calcium: 10.5 mg/dL — ABNORMAL HIGH (ref 8.9–10.3)
Chloride: 102 mmol/L (ref 98–111)
Creatinine, Ser: 1.99 mg/dL — ABNORMAL HIGH (ref 0.61–1.24)
GFR, Estimated: 32 mL/min — ABNORMAL LOW (ref >60)
Glucose, Bld: 167 mg/dL — ABNORMAL HIGH (ref 70–99)
Potassium: 5.1 mmol/L (ref 3.5–5.1)
Sodium: 142 mmol/L (ref 135–145)
Total Bilirubin: 1.5 mg/dL — ABNORMAL HIGH (ref 0.3–1.2)
Total Protein: 6.9 g/dL (ref 6.5–8.1)

## 2021-10-07 LAB — CBC WITH DIFFERENTIAL/PLATELET
Abs Immature Granulocytes: 0.07 10*3/uL (ref 0.00–0.07)
Basophils Absolute: 0 10*3/uL (ref 0.0–0.1)
Basophils Relative: 0 %
Eosinophils Absolute: 0.1 10*3/uL (ref 0.0–0.5)
Eosinophils Relative: 1 %
HCT: 49.2 % (ref 39.0–52.0)
Hemoglobin: 15.7 g/dL (ref 13.0–17.0)
Immature Granulocytes: 1 %
Lymphocytes Relative: 2 %
Lymphs Abs: 0.2 10*3/uL — ABNORMAL LOW (ref 0.7–4.0)
MCH: 28.4 pg (ref 26.0–34.0)
MCHC: 31.9 g/dL (ref 30.0–36.0)
MCV: 89.1 fL (ref 80.0–100.0)
Monocytes Absolute: 0.5 10*3/uL (ref 0.1–1.0)
Monocytes Relative: 6 %
Neutro Abs: 8.2 10*3/uL — ABNORMAL HIGH (ref 1.7–7.7)
Neutrophils Relative %: 90 %
Platelets: 370 10*3/uL (ref 150–400)
RBC: 5.52 MIL/uL (ref 4.22–5.81)
RDW: 18.6 % — ABNORMAL HIGH (ref 11.5–15.5)
Smear Review: NORMAL
WBC: 9.1 10*3/uL (ref 4.0–10.5)
nRBC: 0.2 % (ref 0.0–0.2)

## 2021-10-07 LAB — MRSA NEXT GEN BY PCR, NASAL: MRSA by PCR Next Gen: NOT DETECTED

## 2021-10-07 LAB — APTT: aPTT: 29 seconds (ref 24–36)

## 2021-10-07 LAB — LACTIC ACID, PLASMA: Lactic Acid, Venous: 4.2 mmol/L (ref 0.5–1.9)

## 2021-10-07 LAB — TROPONIN I (HIGH SENSITIVITY): Troponin I (High Sensitivity): 91 ng/L — ABNORMAL HIGH (ref <18)

## 2021-10-07 LAB — BRAIN NATRIURETIC PEPTIDE: B Natriuretic Peptide: 214.7 pg/mL — ABNORMAL HIGH (ref 0.0–100.0)

## 2021-10-07 MED ORDER — BIOTENE DRY MOUTH MT LIQD
15.0000 mL | OROMUCOSAL | Status: DC | PRN
  Filled 2021-10-07: qty 15

## 2021-10-07 MED ORDER — SODIUM CHLORIDE 0.9 % IV SOLN
2.0000 g | Freq: Once | INTRAVENOUS | Status: AC
  Administered 2021-10-07: 2 g via INTRAVENOUS
  Filled 2021-10-07: qty 2

## 2021-10-07 MED ORDER — LORAZEPAM 2 MG/ML PO CONC: 1.0000 mg | ORAL | Status: DC | PRN

## 2021-10-07 MED ORDER — HALOPERIDOL LACTATE 2 MG/ML PO CONC
0.5000 mg | ORAL | Status: DC | PRN
  Filled 2021-10-07: qty 0.3

## 2021-10-07 MED ORDER — TRAZODONE HCL 50 MG PO TABS: 25.0000 mg | ORAL_TABLET | Freq: Every evening | ORAL | Status: DC | PRN

## 2021-10-07 MED ORDER — LORAZEPAM 2 MG/ML IJ SOLN
1.0000 mg | INTRAMUSCULAR | Status: DC | PRN
  Administered 2021-10-07 – 2021-10-09 (×2): 1 mg via INTRAVENOUS
  Filled 2021-10-07 (×2): qty 1

## 2021-10-07 MED ORDER — VANCOMYCIN HCL IN DEXTROSE 1-5 GM/200ML-% IV SOLN
1000.0000 mg | Freq: Once | INTRAVENOUS | Status: AC
  Administered 2021-10-07: 1000 mg via INTRAVENOUS
  Filled 2021-10-07: qty 200

## 2021-10-07 MED ORDER — GLYCOPYRROLATE 0.2 MG/ML IJ SOLN
0.2000 mg | INTRAMUSCULAR | Status: DC | PRN
  Filled 2021-10-07: qty 1

## 2021-10-07 MED ORDER — SODIUM CHLORIDE 0.9 % IV BOLUS
500.0000 mL | Freq: Once | INTRAVENOUS | Status: AC
  Administered 2021-10-07: 500 mL via INTRAVENOUS

## 2021-10-07 MED ORDER — MORPHINE SULFATE (PF) 4 MG/ML IV SOLN
INTRAVENOUS | Status: AC
  Filled 2021-10-07: qty 1

## 2021-10-07 MED ORDER — GLYCOPYRROLATE 1 MG PO TABS
1.0000 mg | ORAL_TABLET | ORAL | Status: DC | PRN
  Filled 2021-10-07: qty 1

## 2021-10-07 MED ORDER — ONDANSETRON 4 MG PO TBDP
4.0000 mg | ORAL_TABLET | Freq: Four times a day (QID) | ORAL | Status: DC | PRN
  Filled 2021-10-07: qty 1

## 2021-10-07 MED ORDER — POLYVINYL ALCOHOL 1.4 % OP SOLN
1.0000 [drp] | Freq: Four times a day (QID) | OPHTHALMIC | Status: DC | PRN
  Filled 2021-10-07: qty 15

## 2021-10-07 MED ORDER — SODIUM CHLORIDE 0.9 % IV SOLN
12.5000 mg | Freq: Four times a day (QID) | INTRAVENOUS | Status: DC | PRN
  Filled 2021-10-07: qty 0.5

## 2021-10-07 MED ORDER — ACETAMINOPHEN 325 MG PO TABS: 650.0000 mg | ORAL_TABLET | Freq: Four times a day (QID) | ORAL | Status: DC | PRN

## 2021-10-07 MED ORDER — MORPHINE SULFATE (PF) 2 MG/ML IV SOLN
1.0000 mg | INTRAVENOUS | Status: DC | PRN
  Administered 2021-10-07 – 2021-10-08 (×5): 1 mg via INTRAVENOUS
  Filled 2021-10-07 (×4): qty 1

## 2021-10-07 MED ORDER — LEVALBUTEROL HCL 1.25 MG/0.5ML IN NEBU
1.2500 mg | INHALATION_SOLUTION | Freq: Once | RESPIRATORY_TRACT | Status: AC
  Administered 2021-10-07: 1.25 mg via RESPIRATORY_TRACT
  Filled 2021-10-07 (×3): qty 0.5

## 2021-10-07 MED ORDER — HALOPERIDOL LACTATE 5 MG/ML IJ SOLN: 0.5000 mg | INTRAMUSCULAR | Status: DC | PRN

## 2021-10-07 MED ORDER — METHYLPREDNISOLONE SODIUM SUCC 125 MG IJ SOLR
125.0000 mg | INTRAMUSCULAR | Status: AC
  Administered 2021-10-07: 125 mg via INTRAVENOUS
  Filled 2021-10-07: qty 2

## 2021-10-07 MED ORDER — ONDANSETRON HCL 4 MG/2ML IJ SOLN: 4.0000 mg | Freq: Four times a day (QID) | INTRAMUSCULAR | Status: DC | PRN

## 2021-10-07 MED ORDER — LORAZEPAM 1 MG PO TABS: 1.0000 mg | ORAL_TABLET | ORAL | Status: DC | PRN

## 2021-10-07 MED ORDER — HALOPERIDOL 0.5 MG PO TABS
0.5000 mg | ORAL_TABLET | ORAL | Status: DC | PRN
  Filled 2021-10-07: qty 1

## 2021-10-07 MED ORDER — ACETAMINOPHEN 650 MG RE SUPP: 650.0000 mg | Freq: Four times a day (QID) | RECTAL | Status: DC | PRN

## 2021-10-07 NOTE — ED Triage Notes (Signed)
Pt in via [facility] from [facility].  Per EMS, patient with declining respiratory status, today presenting with gurgling respirations.  Pt on 15L NRB mask upon arrival.  Patient alert and responsive to verbal commands.  EDP, Quale at bedside.  RT at bedside as well.  Bipap placed on patient at this time.  Hx of Lung Ca per EMS report.

## 2021-10-07 NOTE — H&P (Signed)
History and Physical    Jacob [name]:[id number] DOB: [birth year] DOA: 09/29/2021  PCP: [name], MD (Inactive)   Patient coming from: [place] have personally briefly reviewed patient's old medical records in [place]  Chief Complaint: Shortness of breath  HPI: Jacob [name] is a 87 y.o. male with medical history significant for stage IV prostate cancer with metastatic disease to the bone and rectum who presents to the emergency room via EMS from the skilled nursing facility for evaluation of sudden onset shortness of breath. When patient arrived emergency room he was noted to have gurgly respirations and was on 15 L via nonrebreather mask.  He was placed on BiPAP to reduce work of breathing and improve his oxygenation. Patient is awake and on the BiPAP but is unable to provide any history.  I am unable to do review of systems on this patient. Patient's son was at the bedside and met with the palliative care nurse and is in agreement for his father to be placed on comfort measures with no further aggressive treatment at this time. VBG 7.32/59/32/30.4/55 Sodium 142, potassium 5.1, chloride 102, bicarb 28, glucose 162, BUN 60, creatinine 1.9, calcium 10.5, alkaline phosphatase 182, albumin 3.1, AST 35, ALT 10, total protein 6.9, BNP 214, troponin 91, lactic acid 4.2, procalcitonin 2.93, white count 9.1, hemoglobin 15.7, hematocrit 49.2, MCV 89.1, RDW 18.6, platelet count 370, PT 17.2, INR 1.4, PSA 2382 Respiratory viral panel is negative Chest x-ray reviewed by me shows findings of asymmetric edema versus atypical infection.  No lobar consolidation, effusion or pneumothorax. Twelve-lead EKG reviewed by me shows artifact with sinus tachycardia    ED Course: Patient is an 87 year old male who resides in a skilled nursing facility and was sent to the ER for evaluation of sudden onset respiratory distress. Patient was placed on a nonrebreather mask at 15 L by EMS and  transition to BiPAP when he arrived to the emergency room. He has an underlying history of stage IV prostate cancer and palliative care was involved in the ER will be placed on comfort measures.   Review of Systems: As per HPI otherwise all other systems reviewed and negative.    Past Medical History:  Diagnosis Date   BPH (benign prostatic hyperplasia)    Cancer (HCC)    Chronic constipation 06/25/2015   COPD (chronic obstructive pulmonary disease) ([place])    pt denies.   Family history of breast cancer    Family history of prostate cancer    Hypertension    Swelling of lower extremity    bilateral   Wears dentures    full upper and lower - do not fit well.    Past Surgical History:  Procedure Laterality Date   CATARACT EXTRACTION W/PHACO Right 09/30/2015   Procedure: CATARACT EXTRACTION PHACO AND INTRAOCULAR LENS PLACEMENT (IOC);  Surgeon: Leandrew Koyanagi, MD;  Location: [facility];  Service: Ophthalmology;  Laterality: Right;   COLONOSCOPY  2009?   COLONOSCOPY WITH PROPOFOL N/A 01/06/2021   Procedure: COLONOSCOPY WITH PROPOFOL;  Surgeon: Lin Landsman, MD;  Location: [facility] ENDOSCOPY;  Service: Gastroenterology;  Laterality: N/A;   HERNIA REPAIR Right    x2      reports that he has been smoking cigarettes. He has a 11.25 pack-year smoking history. He has never used smokeless tobacco. He reports that he does not currently use alcohol. He reports that he does not use drugs.  No Known Allergies  Family History  Problem  Relation Age of Onset   Prostate cancer Brother    Alzheimer's disease Mother    Breast cancer Mother        dx 39 or 65   Stroke Father    Heart attack Daughter    Kidney cancer Neg Hx    Kidney disease Neg Hx    Bladder Cancer Neg Hx       Prior to Admission medications   Medication Sig Start Date End Date Taking? Authorizing Provider  acetaminophen (TYLENOL) 500 MG tablet Take 500 mg by mouth every 4 (four) hours as needed.   Yes  [provider]  amitriptyline (ELAVIL) 10 MG tablet 20 mg at bedtime. 09/11/20  Yes [provider]  amLODipine (NORVASC) 10 MG tablet Take 5 mg by mouth daily. 04/28/21  Yes [provider]  aspirin 81 MG tablet Take 81 mg by mouth daily. PM   Yes [provider]  bisacodyl (DULCOLAX) 5 MG EC tablet Take 10 mg by mouth daily as needed for moderate constipation.   Yes [provider]  carbidopa-levodopa (SINEMET IR) 25-100 MG tablet Take 1 tablet by mouth 3 (three) times daily. 06/11/21  Yes [provider]  cyanocobalamin 500 MCG tablet Take 500 mcg by mouth daily. PM   Yes [provider]  finasteride (PROSCAR) 5 MG tablet TAKE 1 TABLET BY MOUTH EVERY DAY 06/11/21  Yes Stoioff, Ronda Fairly, MD  gabapentin (NEURONTIN) 100 MG capsule Take 100 mg by mouth 2 (two) times daily. 06/02/21  Yes [provider]  lidocaine (LIDODERM) 5 % Place 2 patches onto the skin daily. Apply to lower back. Remove & Discard patch within 12 hours or as directed by MD 09/10/21  Yes Debbe Odea, MD  magnesium oxide (MAG-OX) 400 (240 Mg) MG tablet Take 1 tablet (400 mg total) by mouth 2 (two) times daily. 09/10/21  Yes Debbe Odea, MD  metoprolol succinate (TOPROL-XL) 50 MG 24 hr tablet Take 50 mg by mouth daily. 08/03/20  Yes [provider]  mirtazapine (REMERON) 7.5 MG tablet Take 7.5 mg by mouth at bedtime. 04/06/21  Yes [provider]  Multiple Vitamin (MULTIVITAMIN WITH MINERALS) TABS tablet Take 1 tablet by mouth daily. 09/10/21  Yes Debbe Odea, MD  oxyCODONE-acetaminophen (PERCOCET/ROXICET) 5-325 MG tablet Give 1 tab Q4 PRN for moderate pain and 2 tabs Q4 PRN for severe pain. 09/10/21  Yes Debbe Odea, MD  polyethylene glycol (MIRALAX / GLYCOLAX) 17 g packet Take 17 g by mouth daily. 09/10/21  Yes Debbe Odea, MD  predniSONE (DELTASONE) 5 MG tablet Take 1 tablet (5 mg total) by mouth 2 (two) times daily with a meal. To be taken while  patient is on abiraterone therapy. 09/21/21  Yes Sindy Guadeloupe, MD  Propylene Glycol (SYSTANE BALANCE) 0.6 % SOLN INSTILL 3 5 TIMES A DAY IN [facility] EYE AS NEEDED FOR DRYNESS. 06/03/19  Yes [provider]  tamsulosin (FLOMAX) 0.4 MG CAPS capsule TAKE 1 CAPSULE BY MOUTH EVERY DAY 09/30/20  Yes McGowan, Larene Beach A, PA-C  abiraterone acetate (ZYTIGA) 250 MG tablet Take 4 tablets (1,000 mg total) by mouth daily. Take on an empty stomach 1 hour before or 2 hours after a meal 09/21/21   Sindy Guadeloupe, MD  cetirizine (ZYRTEC) 10 MG tablet Take 10 mg by mouth daily as needed.    [provider]  feeding supplement (ENSURE ENLIVE / ENSURE PLUS) LIQD Take 237 mLs by mouth 3 (three) times daily between meals. 08/03/21  Loletha Grayer, MD    Physical Exam: Vitals:   09/27/2021 1245 09/16/2021 1300 09/28/2021 1330 10/01/2021 1415  BP: 102/74 (!) 160/121 (!) 155/128   Pulse:  (!) 36 (!) 54   Resp: (!) 25 (!) 30 (!) 27 (!) 26  Temp:      TempSrc:      SpO2:  91% 95%   Weight:      Height:         Vitals:   09/24/2021 1245 10/10/2021 1300 09/25/2021 1330 09/12/2021 1415  BP: 102/74 (!) 160/121 (!) 155/128   Pulse:  (!) 36 (!) 54   Resp: (!) 25 (!) 30 (!) 27 (!) 26  Temp:      TempSrc:      SpO2:  91% 95%   Weight:      Height:          Constitutional: Alert and oriented x to person.  Appears to be in respiratory distress and is currently on a BiPAP.  Chronically ill-appearing.  Frail HEENT:      Head: Normocephalic and atraumatic.         Eyes: PERLA, EOMI, Conjunctivae pallor. Sclera is non-icteric.       Mouth/Throat: Mucous membranes are dry       Neck: Supple with no signs of meningismus. Cardiovascular: Tachycardic. No murmurs, gallops, or rubs. 2+ symmetrical distal pulses are present . No JVD. 3+ LE edema Respiratory: Tachypneic.diffuse rhonchi and scattered wheezes in both lung fields.   Gastrointestinal: Soft, non tender, and non distended with positive bowel sounds.   Genitourinary: No CVA tenderness. Musculoskeletal: Nontender with normal range of motion in all extremities. No cyanosis, or erythema of extremities. Neurologic: Unable to assess. Skin: Skin is warm, dry.  No rash or ulcers Psychiatric: Unable to assess   Labs on Admission: I have personally reviewed following labs and imaging studies  CBC: Recent Labs  Lab 10/05/21 1036 10/11/2021 1149  WBC 8.5 9.1  NEUTROABS 6.9 8.2*  HGB 15.5 15.7  HCT 49.3 49.2  MCV 86.6 89.1  PLT 438* 630   Basic Metabolic Panel: Recent Labs  Lab 10/05/21 1036 10/01/2021 1149  NA 139 142  K 4.5 5.1  CL 99 102  CO2 30 28  GLUCOSE 96 167*  BUN 51* 60*  CREATININE 1.72* 1.99*  CALCIUM 10.0 10.5*   GFR: Estimated Creatinine Clearance: 21.8 mL/min (A) (by C-G formula based on SCr of 1.99 mg/dL (H)). Liver Function Tests: Recent Labs  Lab 10/05/21 1036 09/17/2021 1149  AST 27 35  ALT <5 10  ALKPHOS 205* 182*  BILITOT 1.5* 1.5*  PROT 7.5 6.9  ALBUMIN 3.3* 3.1*   No results for input(s): LIPASE, AMYLASE in the last 168 hours. No results for input(s): AMMONIA in the last 168 hours. Coagulation Profile: Recent Labs  Lab 09/25/2021 1149  INR 1.4*   Cardiac Enzymes: No results for input(s): CKTOTAL, CKMB, CKMBINDEX, TROPONINI in the last 168 hours. BNP (last 3 results) No results for input(s): PROBNP in the last 8760 hours. HbA1C: No results for input(s): HGBA1C in the last 72 hours. CBG: No results for input(s): GLUCAP in the last 168 hours. Lipid Profile: No results for input(s): CHOL, HDL, LDLCALC, TRIG, CHOLHDL, LDLDIRECT in the last 72 hours. Thyroid Function Tests: No results for input(s): TSH, T4TOTAL, FREET4, T3FREE, THYROIDAB in the last 72 hours. Anemia Panel: No results for input(s): VITAMINB12, FOLATE, FERRITIN, TIBC, IRON, RETICCTPCT in the last 72 hours. Urine analysis:    Component Value  Date/Time   COLORURINE YELLOW (A) 09/05/2021 2201   APPEARANCEUR CLEAR (A) 09/05/2021  2201   APPEARANCEUR Clear 09/25/2020 1314   LABSPEC 1.044 (H) 09/05/2021 2201   LABSPEC 1.011 11/17/2012 1216   PHURINE 7.0 09/05/2021 2201   GLUCOSEU NEGATIVE 09/05/2021 2201   GLUCOSEU Negative 11/17/2012 1216   HGBUR NEGATIVE 09/05/2021 2201   BILIRUBINUR NEGATIVE 09/05/2021 2201   BILIRUBINUR Negative 09/25/2020 1314   BILIRUBINUR Negative 11/17/2012 1216   KETONESUR NEGATIVE 09/05/2021 2201   PROTEINUR 30 (A) 09/05/2021 2201   NITRITE NEGATIVE 09/05/2021 2201   LEUKOCYTESUR NEGATIVE 09/05/2021 2201   LEUKOCYTESUR Negative 11/17/2012 1216    Radiological Exams on Admission: DG Chest Port 1 View  Result Date: 09/26/2021 CLINICAL DATA:  Respiratory distress in an 87 year old male. EXAM: PORTABLE CHEST 1 VIEW COMPARISON:  July 28, 2021. FINDINGS: Defibrillator pads project over the patient's chest. Cardiac leads also project over the patient's chest. Increased interstitial markings are present bilaterally. No lobar consolidation. There is some subtle nodularity in the background seen in the LEFT upper lobe and RIGHT mid chest. No lobar level consolidative process. No pneumothorax. No effusion. On limited assessment there is no acute skeletal process. IMPRESSION: Findings of asymmetric edema versus atypical infection. No lobar consolidation, effusion or pneumothorax. Electronically Signed   By: Zetta Bills M.D.   On: 10/05/2021 12:27     Assessment/Plan Principal Problem:   Acute respiratory failure with hypoxia (HCC) Active Problems:   Essential hypertension   Prostate cancer metastatic to bone (HCC)   Failure to thrive in adult   AKI (acute kidney injury) ([place])   Acute respiratory failure ([place])   Sepsis ([place])      Patient is an 87 year old male with a history of stage IV prostate cancer with metastatic disease to the bone and colon who presents to the ER for evaluation of respiratory distress and hypoxia requiring noninvasive mechanical ventilation.  He also has an acute  kidney injury with elevated lactic acid level and procalcitonin level suggestive of an infectious process. Due to patient's comorbidities, goals of care conversation was had in the ER by the palliative care nurse and patient's son Nameer Summer.  Patient's son is in agreement for comfort measures and does not want any aggressive treatment at this time.  CODE STATUS was discussed and patient is a DNR. He will be admitted to the hospital for comfort measures   DVT prophylaxis: None  Code Status: DO NOT RESUSCITATE Family Communication: Greater than 50% of time was spent discussing patient's condition and plan of care as well as his poor prognosis with his son Kalim Kissel who was at the bedside.  All questions and concerns have been addressed.  He verbalizes understanding and agrees with the plan.  CODE STATUS was discussed and he is a DNR Disposition Plan: Back to previous home environment Consults called: Palliative care Status:.at famiAt the time of admission, it appears that the appropriate admission status for this patient is inpatient. This is judged to be reasonable and necessary to provide the required intensity of service to ensure the patient's safety given the presenting symptoms, physical exam findings, and initial radiographic and laboratory data in the context of their comorbid conditions. Patient requires inpatient status due to high intensity of service, high risk for further deterioration and high frequency of surveillance required.     Collier Bullock MD [facility]     09/30/2021, 2:30 PM

## 2021-10-07 NOTE — ED Notes (Signed)
Pt reports not needing anything. Using yes/no question for response from pt.

## 2021-10-07 NOTE — Progress Notes (Signed)
Complete note to follow.  Patient has been made a DNR.  Patient has been moved to full comfort measures.  CCMD, admitting physician Agbata, emergency MD, and nursing staff made aware of plan to move forward with only comfort measures.  Anticipate hospital death.  Dean Ilsa Iha, FNP-BC Palliative Medicine Team Team Phone # [phone]  NO CHARGE

## 2021-10-07 NOTE — ED Notes (Signed)
Radiology at bedside

## 2021-10-07 NOTE — Consult Note (Signed)
PHARMACY -  BRIEF ANTIBIOTIC NOTE   Pharmacy has received consult(s) for cefepime and Vancomycin from an ED provider.  The patient's profile has been reviewed for ht/wt/allergies/indication/available labs.    One time order(s) placed for  Cefepime 2 gram Vancomycin 1000 mg   Further antibiotics/pharmacy consults should be ordered by admitting physician if indicated.                       Thank you, Dorothe Pea, PharmD, BCPS Clinical Pharmacist   10/05/2021  12:24 PM

## 2021-10-07 NOTE — ED Notes (Signed)
Pt is comfortable at present, son is at the bedside

## 2021-10-07 NOTE — ED Notes (Signed)
Palliative Care NP at bedside; asked to notify her when family arrives.  Cell [phone].

## 2021-10-07 NOTE — ED Notes (Signed)
Pt at this report not needing anything. Pt was able to answer questions. NADN

## 2021-10-07 NOTE — ED Notes (Signed)
Pt taken off bipap and place on 4L [place], son is at the bedside

## 2021-10-07 NOTE — Consult Note (Signed)
Consultation Note Date: 09/30/2021   Patient Name: Jacob [name]  DOB: 27-Nov-1934  MRN: [id number]  Age / Sex: 87 y.o., male  PCP: Jacob Kayser, MD (Inactive) Referring Physician: Collier Bullock, MD  Reason for Consultation: Establishing goals of care  HPI/Patient Profile: 87 y.o. male  with past medical history of resistant metastatic prostate cancer (s/p radiation treatments and Lupron), hypertension, hyperlipidemia, neuropathy, Parkinson's, pneumonia (August 2022) and BPH, who was admitted from skilled nursing facility with complaints of shortness of breath.   Palliative medicine consulted to discuss [name] STATUS and establish goals of care.  Clinical Assessment and Goals of Care: I have reviewed medical records including EPIC notes, labs and imaging, received report from Jacob [name] and nursing, assessed the patient and then met with patient and patient's son Jacob [name] at bedside to discuss diagnosis prognosis, GOC, EOL wishes, disposition, and options.  I introduced Palliative Medicine as specialized medical care for people living with serious illness. It focuses on providing relief from the symptoms and stress of a serious illness. The goal is to improve quality of life for both the patient and the family.  We discussed a brief life review of the patient.  I am familiar with Jacob [name] as we met approximately 1 month ago during his prior hospitalization.  He has 4 living children.  Jacob [name] is his healthcare power of attorney.  As far as functional and nutritional status prior to admission and while a resident of the skilled nursing facility Jacob [name] shares that he has seen a large decline in his father.  He is not sure of the last full meal that his father ate.  He has noticed a significant weight loss.  The patient's mentation has stayed intact.  We discussed patient's current illness and what it means in the larger  context of patient's on-going co-morbidities. Jacob [name] asked if there were any more treatments available.  I shared that at this point given his current health status that there are likely no treatments he would be able to tolerate.  He is not medically stable at this time and appears to be dying.  Asked Jacob [name] if he understood what was going on, I clarified if he understood where he was, and he said he knew he was in the hospital and that it was not looking good.  He said that he would like to see with the next few months of burning.  I shared that he likely does not have months to live but more so hours to days.   Mode status was addressed.  I shared that as it stands right now should his heart stop, his lungs ceased to work, and he physically passes away, we would call a [name] BLUE, attempt cardiopulmonary resuscitation, and likely put a breathing tube down his throat and attach them to a ventilator.  I outlined that this would be life support that I am not sure he would ever be able to come off of.  Also shared I would likely have to put him asleep  and that he may not ever be able to have a meaningful recovery or be as awake and alert as he is at this moment.  I shared that an alternative to staying a full [name] that he could make the choice to allow a natural death.  This would involve keeping him comfortable, managing his symptoms, and ensuring no prolonged suffering.  I shared that changing his [name] STATUS would mean that we would continue to treat him and keep him as comfortable as possible.  The patient shared that he would do what ever Jacob [name] said.  Jacob [name] said that it was the patient's decision.  I asked the patient again if he would like to allow a natural death and allow [place] to keep him as comfortable as possible and he said yes.   [name] STATUS changed from full [name] to DNR.  Patient to have full comfort measures.  Emergency Jacob [name], attending physician Jacob [name], and critical care nurse practitioner Jacob [name]  made aware of patient moving to full comfort care.  Questions and concerns were addressed. The family was encouraged to call with questions or concerns.   Primary Decision Maker PATIENT  [name] Status/Advance Care Planning: DNR  Prognosis:   Hours - Days  Discharge Planning: Anticipated Hospital Death  Primary Diagnoses: Present on Admission:  Acute respiratory failure ([place])   Physical Exam Constitutional:      Appearance: He is ill-appearing and toxic-appearing.     Comments: cachexia  HENT:     Head: Normocephalic.     Mouth/Throat:     Mouth: Mucous membranes are dry.  Cardiovascular:     Rate and Rhythm: Normal rate.  Pulmonary:     Effort: Respiratory distress present.     Comments: BiPap Abdominal:     Palpations: Abdomen is soft.  Skin:    General: Skin is warm and dry.  Neurological:     Mental Status: He is alert.  Psychiatric:        Thought Content: Thought content normal.    Vital Signs: BP (!) 155/128   Pulse (!) 54   Temp 98.2 F (36.8 C) (Oral)   Resp (!) 27   Ht 6' (1.829 m)   Wt 56.8 kg   SpO2 95%   BMI 16.98 kg/m  Pain Scale: 0-10   Pain Score: 0-No pain SpO2: SpO2: 95 % O2 Device:SpO2: 95 % O2 Flow Rate: .O2 Flow Rate (L/min): 15 L/min  Palliative Assessment/Data:     I discussed this patient's plan of care with Jacob [name], CCM, nursing, patient, patient's son.  Thank you for this consult. Palliative medicine will continue to follow and assist holistically.   Time Total: 70 minutes Greater than 50%  of this time was spent counseling and coordinating care related to the above assessment and plan.  Signed by: Jacob Hawks, DNP, FNP-BC Palliative Medicine    Please contact Palliative Medicine Team phone at [phone] for questions and concerns.  For individual provider: See Shea Evans

## 2021-10-07 NOTE — ED Notes (Signed)
Pt resting comfortably at present, repositioned. Pt given a few drops of water with a straw orally. Lips wiped with a wet clothe

## 2021-10-07 NOTE — ED Notes (Signed)
At this time pt is able to answer yes/no questions.

## 2021-10-07 NOTE — ED Provider Notes (Signed)
[facility] Emergency Department Provider Note   ____________________________________________   Event Date/Time   First MD Initiated Contact with Patient 09/11/2021 1156     (approximate)  I have reviewed the triage vital signs and the nursing notes.   HISTORY  Chief Complaint Respiratory Distress  EM caveat: Respiratory distress, severe   HPI Jacob [name] is a 87 y.o. male presents in severe respiratory distress.  Per EMS the patient has evidently had increasing shortness of breath today.  He had a 15 L oxygen requirement at his nursing home with increasing shortness of breath prompting call 911.  Spoke with the patient's son, he knows that the patient had a significant decline with difficulty breathing and is coming to the ER  Patient between breaths is able to indicate that he is in no pain.  He reports feeling short of breath.  He cannot otherwise describe symptoms well Past Medical History:  Diagnosis Date   BPH (benign prostatic hyperplasia)    Cancer (HCC)    Chronic constipation 06/25/2015   COPD (chronic obstructive pulmonary disease) ([place])    pt denies.   Family history of breast cancer    Family history of prostate cancer    Hypertension    Swelling of lower extremity    bilateral   Wears dentures    full upper and lower - do not fit well.    Patient Active Problem List   Diagnosis Date Noted   Palliative care by specialist    Full code status    Failure to thrive in adult 09/05/2021   AKI (acute kidney injury) ([place]) 09/05/2021   Palliative care encounter    Bedbug bite    Protein-calorie malnutrition, severe 07/29/2021   Acute respiratory failure with hypoxia (HCC)    Lobar pneumonia ([place])    COPD with acute exacerbation ([place])    Weakness    CAP (community acquired pneumonia) 07/28/2021   Parkinson disease ([place]) 07/28/2021   Depression 07/28/2021   Genetic testing 04/12/2021   Family history of prostate cancer    Family  history of breast cancer    Goals of care, counseling/discussion 01/18/2021   Prostate cancer metastatic to bone ([place]) 01/14/2021   Vitiligo 10/04/2019   Vitamin D deficiency 12/28/2018   Right inguinal hernia 06/21/2018   Chronic kidney insufficiency, stage 2 (mild) 02/02/2018   Chronic bronchitis, simple ([place]) 12/28/2016   High risk medication use 12/24/2015   Cigarette smoker 12/24/2015   Essential hypertension 06/24/2014   Benign prostatic hyperplasia 06/24/2014   Back pain 06/24/2014   B12 deficiency 06/24/2014    Past Surgical History:  Procedure Laterality Date   CATARACT EXTRACTION W/PHACO Right 09/30/2015   Procedure: CATARACT EXTRACTION PHACO AND INTRAOCULAR LENS PLACEMENT ([place]);  Surgeon: Leandrew Koyanagi, MD;  Location: [facility];  Service: Ophthalmology;  Laterality: Right;   COLONOSCOPY  2009?   COLONOSCOPY WITH PROPOFOL N/A 01/06/2021   Procedure: COLONOSCOPY WITH PROPOFOL;  Surgeon: Lin Landsman, MD;  Location[facility] END[facility]COPY;  Service: Gastroenterology;  Laterality: N/A;   HERNIA REPAIR Right    x2     Prior to Admission medications   Medication Sig Start Date End Date Taking? Authorizing Provider  acetaminophen (TYLENOL) 500 MG tablet Take 500 mg by mouth every 4 (four) hours as needed.   Yes [provider]  amitriptyline (ELAVIL) 10 MG tablet 20 mg at bedtime. 09/11/20  Yes [provider]  amLODipine (NORVASC) 10 MG tablet Take 5 mg by  mouth daily. 04/28/21  Yes [provider]  aspirin 81 MG tablet Take 81 mg by mouth daily. PM   Yes [provider]  bisacodyl (DULCOLAX) 5 MG EC tablet Take 10 mg by mouth daily as needed for moderate constipation.   Yes [provider]  carbidopa-levodopa (SINEMET IR) 25-100 MG tablet Take 1 tablet by mouth 3 (three) times daily. 06/11/21  Yes [provider]  cyanocobalamin 500 MCG tablet Take 500 mcg by mouth daily. PM   Yes [provider]   finasteride (PROSCAR) 5 MG tablet TAKE 1 TABLET BY MOUTH EVERY DAY 06/11/21  Yes Stoioff, Ronda Fairly, MD  gabapentin (NEURONTIN) 100 MG capsule Take 100 mg by mouth 2 (two) times daily. 06/02/21  Yes [provider]  lidocaine (LIDODERM) 5 % Place 2 patches onto the skin daily. Apply to lower back. Remove & Discard patch within 12 hours or as directed by MD 09/10/21  Yes Debbe Odea, MD  magnesium oxide (MAG-OX) 400 (240 Mg) MG tablet Take 1 tablet (400 mg total) by mouth 2 (two) times daily. 09/10/21  Yes Debbe Odea, MD  metoprolol succinate (TOPROL-XL) 50 MG 24 hr tablet Take 50 mg by mouth daily. 08/03/20  Yes [provider]  mirtazapine (REMERON) 7.5 MG tablet Take 7.5 mg by mouth at bedtime. 04/06/21  Yes [provider]  Multiple Vitamin (MULTIVITAMIN WITH MINERALS) TABS tablet Take 1 tablet by mouth daily. 09/10/21  Yes Debbe Odea, MD  oxyCODONE-acetaminophen (PERCOCET/ROXICET) 5-325 MG tablet Give 1 tab Q4 PRN for moderate pain and 2 tabs Q4 PRN for severe pain. 09/10/21  Yes Debbe Odea, MD  polyethylene glycol (MIRALAX / GLYCOLAX) 17 g packet Take 17 g by mouth daily. 09/10/21  Yes Debbe Odea, MD  predniSONE (DELTASONE) 5 MG tablet Take 1 tablet (5 mg total) by mouth 2 (two) times daily with a meal. To be taken while patient is on abiraterone therapy. 09/21/21  Yes Sindy Guadeloupe, MD  Propylene Glycol (SYSTANE BALANCE) 0.6 % SOLN INSTILL 3 5 TIMES A DAY IN [facility] E[facility] AS NEEDED FOR DRYNESS. 06/03/19  Yes [provider]  tamsulosin (FLOMAX) 0.4 MG CAPS capsule TAKE 1 CAPSULE BY MOUTH EVERY DAY 09/30/20  Yes McGowan, Larene [place] A, PA-C  abiraterone acetate (ZYTIGA) 250 MG tablet Take 4 tablets (1,000 mg total) by mouth daily. Take on an empty stomach 1 hour before or 2 hours after a meal 09/21/21   Sindy Guadeloupe, MD  cetirizine (ZYRTEC) 10 MG tablet Take 10 mg by mouth daily as needed.    [provider]  feeding supplement (ENSURE ENLIVE / ENSURE  PLUS) LIQD Take 237 mLs by mouth 3 (three) times daily between meals. 08/03/21   Loletha Grayer, MD    Allergies Patient has no known allergies.  Family History  Problem Relation Age of Onset   Prostate cancer Brother    Alzheimer's disease Mother    Breast cancer Mother        dx 39 or 32   Stroke Father    Heart attack Daughter    Kidney cancer Neg Hx    Kidney disease Neg Hx    Bladder Cancer Neg Hx     Social History Social History   Tobacco Use   Smoking status: Every Day    Packs/day: 0.25    Years: 45.00    Pack years: 11.25    Types: Cigarettes   Smokeless tobacco: Never  Vaping Use   Vaping Use: Never  used  Substance Use Topics   Alcohol use: Not Currently   Drug use: No    Review of Systems  EM caveat   ____________________________________________   PHYSICAL EXAM:  VITAL SIGNS: ED Triage Vitals  Enc Vitals Group     BP 10/02/2021 1155 111/76     Pulse --      Resp --      Temp 10/06/2021 1155 98.2 F (36.8 C)     Temp Source 09/16/2021 1155 Oral     SpO2 09/30/2021 1155 100 %     Weight 09/19/2021 1157 125 lb 3.5 oz (56.8 kg)     Height 10/01/2021 1157 6' (1.829 m)     Head Circumference --      Peak Flow --      Pain Score 09/17/2021 1156 0     Pain Loc --      Pain Edu? --      Excl. in [place]? --     Constitutional: Alert and able to demonstrate orientation to himself and being in [place] but unable to obtain orientation with regard to month or date.  He speaks perhaps 1-2 words at a time between breaths obviously tachypneic with severe increased work of breathing and very rhonchorous sounding expiratory cough Eyes: Conjunctivae are normal. Head: Atraumatic. Nose: No congestion/rhinnorhea. Mouth/Throat: Mucous membranes are dry. Neck: No stridor.  Cardiovascular: Normal rate, regular rhythm. Grossly normal heart sounds.  Good peripheral circulation. Respiratory: Severe tachypnea, respiratory distress.  Notable expiratory rhonchi.  No  wheezing.  Lung sounds are present throughout all fields but it sounds have significant central rhonchi perhaps some rales in the bases bilaterally.  Accessory muscle use is evident. Gastrointestinal: Soft and nontender. No distention.  Very cachectic in appearance. Musculoskeletal: No lower extremity tenderness nor edema. Neurologic:  Normal speech and language. No gross focal neurologic deficits are appreciated.  Skin:  Skin is warm, dry and intact. No rash noted. Psychiatric: Mood and affect are normal. Speech and behavior are normal.  ____________________________________________   LABS (all labs ordered are listed, but only abnormal results are displayed)  Labs Reviewed  LACTIC ACID, PLASMA - Abnormal; Notable for the following components:      Result Value   Lactic Acid, Venous 4.2 (*)    All other components within normal limits  COMPREHENSIVE METABOLIC PANEL - Abnormal; Notable for the following components:   Glucose, Bld 167 (*)    BUN 60 (*)    Creatinine, Ser 1.99 (*)    Calcium 10.5 (*)    Albumin 3.1 (*)    Alkaline Phosphatase 182 (*)    Total Bilirubin 1.5 (*)    GFR, Estimated 32 (*)    All other components within normal limits  CBC WITH DIFFERENTIAL/PLATELET - Abnormal; Notable for the following components:   RDW 18.6 (*)    Neutro Abs 8.2 (*)    Lymphs Abs 0.2 (*)    All other components within normal limits  PROTIME-INR - Abnormal; Notable for the following components:   Prothrombin Time 17.2 (*)    INR 1.4 (*)    All other components within normal limits  BRAIN NATRIURETIC PEPTIDE - Abnormal; Notable for the following components:   B Natriuretic Peptide 214.7 (*)    All other components within normal limits  BLOOD GAS, VENOUS - Abnormal; Notable for the following components:   Bicarbonate 30.4 (*)    Acid-Base Excess 2.5 (*)    All other components within normal limits  TROPONIN  I (HIGH SENSITIVITY) - Abnormal; Notable for the following components:    Troponin I (High Sensitivity) 91 (*)    All other components within normal limits  RESP PANEL BY RT-PCR (FLU A&B, COVID) ARPGX2  CULTURE, BLOOD (SINGLE)  URINE CULTURE  MRSA NEXT GEN BY PCR, NASAL  APTT  PROCALCITONIN  LACTIC ACID, PLASMA  URINALYSIS, COMPLETE (UACMP) WITH MICROSCOPIC   ____________________________________________  EKG  EKG was very difficult to obtain due to patient motion severe movement respiratory distress tremulousness also has Parkinson's  Reviewed inter by me at 1152 Heart rate 110 QRS 139 QTc 450 Sinus tachycardia, difficult to assess but I do not see obvious acute ischemic changes such as that would be expected with STEMI but again severe artifact ____________________________________________  RADIOLOGY  DG Chest Port 1 View  Result Date: 09/24/2021 CLINICAL DATA:  Respiratory distress in an 87 year old male. EXAM: PORTABLE CHEST 1 VIEW COMPARISON:  July 28, 2021. FINDINGS: Defibrillator pads project over the patient's chest. Cardiac leads also project over the patient's chest. Increased interstitial markings are present bilaterally. No lobar consolidation. There is some subtle nodularity in the background seen in the LEFT upper lobe and RIGHT mid chest. No lobar level consolidative process. No pneumothorax. No effusion. On limited assessment there is no acute skeletal process. IMPRESSION: Findings of asymmetric edema versus atypical infection. No lobar consolidation, effusion or pneumothorax. Electronically Signed   By: Zetta Bills M.D.   On: 10/02/2021 12:27     Imaging reviewed personally viewed, concerning for possible infiltrate versus asymmetric edema ____________________________________________   PROCEDURES  Procedure(s) performed: None  Procedures  Critical Care performed: Yes, see critical care note(s)  CRITICAL CARE Performed by: Delman Kitten   Total critical care time: 45 minutes  Critical care time was exclusive of separately  billable procedures and treating other patients.  Critical care was necessary to treat or prevent imminent or life-threatening deterioration.  Critical care was time spent personally by me on the following activities: development of treatment plan with patient and/or surrogate as well as nursing, discussions with consultants, evaluation of patient's response to treatment, examination of patient, obtaining history from patient or surrogate, ordering and performing treatments and interventions, ordering and review of laboratory studies, ordering and review of radiographic studies, pulse oximetry and re-evaluation of patient's condition.  ____________________________________________   INITIAL IMPRESSION / ASSESSMENT AND PLAN / ED COURSE  Pertinent labs & imaging results that were available during my care of the patient were reviewed by me and considered in my medical decision making (see chart for details).    Given the patient's presentation and severe distress he was placed on BiPAP as he is alert and able to hold brief conversation.  He does demonstrate significant distress and nurse I think a high risk he may require intubation if he does not show rapid improvement, but at present he is alert on BiPAP taking minute ventilations of about 14 to 16 L/min and volumes of 600.  Spoke with the patient's son is coming to see him as well.  Also review of records he is followed by palliative care as well, I discussed with palliative care team member Junie Panning who will be providing consultation as well to define goals of care (at present FULL code).  Differential diagnosis is broad but he appears to have obvious acute respiratory distress with rhonchi potentially may have some volume overload, but also in the clinical setting I suspect pneumonia, pulmonary edema, or other acute pulmonary etiology is likely driving his  presentation.  Differential diagnosis however remains broad includes sepsis, COPD, volume  overload, pneumothorax, etc.      Clinical Course as of 09/21/2021 1355  Thu Oct 07, 2021  1333 ICU team (Dr. Doyle Askew) and Hinton Dyer just updated me that patient and family have converted to [place]. Palliative team entering orders for comfort treatment. Request admit to hospitalist. [MQ]    Clinical Course User Index [MQ] Delman Kitten, MD   ----------------------------------------- 1:54 PM on 09/16/2021 ----------------------------------------- Palliative care team is seen and evaluated and patient and family have made decision to move towards comfort measures only.  Patient is now DNR.  He is being admitted to the hospitalist service for further care, case discussed with Dr. Francine Graven.  Cancel sepsis code sepsis/fluid resuscitation patient progressing to comfort measures with palliative care team consulting.  ____________________________________________   FINAL CLINICAL IMPRESSION(S) / ED DIAGNOSES  Final diagnoses:  Acute hypoxemic respiratory failure ([place])        Note:  This document was prepared using Dragon voice recognition software and may include unintentional dictation errors       Delman Kitten, MD 09/30/2021 1356

## 2021-10-08 ENCOUNTER — Inpatient Hospital Stay: Payer: Medicare Other | Admitting: Nurse Practitioner

## 2021-10-08 ENCOUNTER — Inpatient Hospital Stay: Payer: Medicare Other

## 2021-10-08 DIAGNOSIS — N179 Acute kidney failure, unspecified: Secondary | ICD-10-CM | POA: Diagnosis not present

## 2021-10-08 DIAGNOSIS — Z515 Encounter for palliative care: Secondary | ICD-10-CM | POA: Diagnosis not present

## 2021-10-08 DIAGNOSIS — R627 Adult failure to thrive: Secondary | ICD-10-CM | POA: Diagnosis not present

## 2021-10-08 DIAGNOSIS — L8990 Pressure ulcer of unspecified site, unspecified stage: Secondary | ICD-10-CM | POA: Diagnosis present

## 2021-10-08 DIAGNOSIS — J9601 Acute respiratory failure with hypoxia: Secondary | ICD-10-CM | POA: Diagnosis not present

## 2021-10-08 LAB — BLOOD CULTURE ID PANEL (REFLEXED) - BCID2

## 2021-10-08 MED ORDER — ACETAMINOPHEN 325 MG PO TABS: 650.0000 mg | ORAL_TABLET | Freq: Four times a day (QID) | ORAL | Status: DC | PRN

## 2021-10-08 MED ORDER — DIPHENHYDRAMINE HCL 50 MG/ML IJ SOLN: 25.0000 mg | INTRAMUSCULAR | Status: DC | PRN

## 2021-10-08 MED ORDER — GLYCOPYRROLATE 0.2 MG/ML IJ SOLN
0.2000 mg | INTRAMUSCULAR | Status: DC | PRN
  Filled 2021-10-08 (×2): qty 1

## 2021-10-08 MED ORDER — DEXTROSE 5 % IV SOLN: INTRAVENOUS | Status: DC

## 2021-10-08 MED ORDER — CEFAZOLIN SODIUM-DEXTROSE 2-4 GM/100ML-% IV SOLN
2.0000 g | Freq: Two times a day (BID) | INTRAVENOUS | Status: DC
  Administered 2021-10-08: 2 g via INTRAVENOUS
  Filled 2021-10-08 (×2): qty 100

## 2021-10-08 MED ORDER — ACETAMINOPHEN 650 MG RE SUPP: 650.0000 mg | Freq: Four times a day (QID) | RECTAL | Status: DC | PRN

## 2021-10-08 MED ORDER — MORPHINE SULFATE (PF) 2 MG/ML IV SOLN
2.0000 mg | INTRAVENOUS | Status: DC | PRN
  Administered 2021-10-08 – 2021-10-09 (×7): 2 mg via INTRAVENOUS
  Filled 2021-10-08 (×7): qty 1

## 2021-10-08 MED ORDER — GLYCOPYRROLATE 0.2 MG/ML IJ SOLN
0.2000 mg | INTRAMUSCULAR | Status: DC | PRN
  Filled 2021-10-08: qty 1

## 2021-10-08 MED ORDER — HALOPERIDOL LACTATE 5 MG/ML IJ SOLN: 2.5000 mg | INTRAMUSCULAR | Status: DC | PRN

## 2021-10-08 MED ORDER — LORAZEPAM 2 MG/ML IJ SOLN: 2.0000 mg | INTRAMUSCULAR | Status: DC | PRN

## 2021-10-08 MED ORDER — GLYCOPYRROLATE 1 MG PO TABS
1.0000 mg | ORAL_TABLET | ORAL | Status: DC | PRN
  Filled 2021-10-08: qty 1

## 2021-10-08 MED ORDER — POLYVINYL ALCOHOL 1.4 % OP SOLN: 1.0000 [drp] | Freq: Four times a day (QID) | OPHTHALMIC | Status: DC | PRN

## 2021-10-08 NOTE — Assessment & Plan Note (Signed)
Comfort care now °

## 2021-10-08 NOTE — Hospital Course (Signed)
87 y.o. male with medical history significant for stage IV prostate cancer with metastatic disease to the bone and rectum who presents to the emergency room via EMS from the skilled nursing facility for evaluation of sudden onset shortness of breath.  10/28: Actively dying.  Patient made full comfort care only

## 2021-10-08 NOTE — Final Progress Note (Signed)
Patient is actively dying. Family made aware. Son in agreement with full comfort care.

## 2021-10-08 NOTE — Progress Notes (Signed)
  Progress Note    Jacob [name]   LJQ:[id number]  DOB: 01/27/1934  DOA: 09/29/2021     1 Date of Service: 10/08/2021   Clinical Course  87 y.o. male with medical history significant for stage IV prostate cancer with metastatic disease to the bone and rectum who presents to the emergency room via EMS from the skilled nursing facility for evaluation of sudden onset shortness of breath.  10/28: Actively dying.  Patient made full comfort care only   Assessment and Plan * Acute respiratory failure with hypoxia ([place]) Comfort care now  Pressure injury of skin Comfort care now  Sepsis [facility]) Comfort care now  AKI (acute kidney injury) ([place]) Comfort care now  Failure to thrive in adult Comfort care now  Hospice care Comfort care now  Prostate cancer metastatic to bone [facility]) Comfort care now  Essential hypertension Comfort care now     Subjective:  Actively dying  Objective Vitals:   09/18/2021 2246 10/08/21 0255 10/08/21 0735 10/08/21 1000  BP: (!) 50/38 (!) 68/45 (!) 67/46 (!) 75/43  Pulse: (!) 127  (!) 119 (!) 116  Resp: (!) 24 (!) 24 (!) 33 (!) 32  Temp: 98.2 F (36.8 C) 97.9 F (36.6 C) 99.3 F (37.4 C) 98.3 F (36.8 C)  TempSrc: Oral Oral Axillary   SpO2: (!) 87% 91% 90%   Weight:      Height:       56.8 kg  Vital signs were reviewed and unremarkable except for: Blood pressure: Low Tachycardic and tachypneic   Exam Physical Exam   Constitutional:Obtunded.  Appears to be in respiratory distress and Chronically ill-appearing.  Frail HEENT: Head:Normocephalic and atraumatic.  Eyes:PERLA, EOMI, Conjunctivae pallor. Sclera is non-icteric.  Mouth/Throat:Mucous membranes are dry  Neck:Supple with no signs of meningismus. Cardiovascular:Tachycardic. No murmurs, gallops, or rubs. 2+ symmetrical distal pulses are present . No JVD. 3+ LE edema Respiratory:Tachypneic.diffuse rhonchi and scattered wheezes in both lung fields.    Cheyne-Stokes breathing  Gastrointestinal:Soft, non tender, and non distended with positive bowel sounds.  Genitourinary:No CVA tenderness. Musculoskeletal:Nontender with normal range of motion in all extremities. No cyanosis, or erythema of extremities. Neurologic: Unable to assess. Skin:Skin is warm, dry.  No rash or ulcers Psychiatric: Unable to assess  Labs / Other Information My review of labs, imaging, notes and other tests is significant for Elevated creatinine, low albumin elevated total bilirubin and elevated lactate     Disposition Plan: Status is: Inpatient  Remains inpatient appropriate because: Patient is actively dying.  No hospice home bed available per hospice liaison  I have updated patient's son who agrees with keeping him comfortable and no aggressive care.   Time spent: 35 minutes [facility] 10/08/2021, 12:39 PM

## 2021-10-08 NOTE — Progress Notes (Signed)
ID MSSA bacteremia/ metastatic prostate ca Pt is now comfort care

## 2021-10-08 NOTE — ED Notes (Signed)
Informed RN bed assigned 

## 2021-10-08 NOTE — Progress Notes (Signed)
Pt's son Jacob [name] made aware that pt is now in room 133. Son has been informed that pt is actively dying, Mr. Trombetta stated that family will be coming to hospital.

## 2021-10-08 NOTE — Progress Notes (Signed)
PHARMACY - PHYSICIAN COMMUNICATION CRITICAL VALUE ALERT - BLOOD CULTURE IDENTIFICATION (BCID)  Jacob [name] is an 87 y.o. male who presented to [facility] on 09/25/2021 with a chief complaint of respiratory distress.    Assessment:  blood culture growing MSSA in 1 of 2 bottles   Name of physician (or Provider) Contacted: Dr. Manuella Ghazi  Current antibiotics: Patient received one dose of vancomycin and cefepime in the ED, no active antibiotic orders at this time  Changes to prescribed antibiotics recommended:  Start Ancef 2 g IV q12h  Results for orders placed or performed during the hospital encounter of 09/28/2021  Blood Culture ID Panel (Reflexed) (Collected: 09/23/2021 11:49 AM)  Result Value Ref Range   Enterococcus faecalis NOT DETECTED NOT DETECTED   Enterococcus Faecium NOT DETECTED NOT DETECTED   Listeria monocytogenes NOT DETECTED NOT DETECTED   Staphylococcus species DETECTED (A) NOT DETECTED   Staphylococcus aureus (BCID) DETECTED (A) NOT DETECTED   Staphylococcus epidermidis NOT DETECTED NOT DETECTED   Staphylococcus lugdunensis NOT DETECTED NOT DETECTED   Streptococcus species NOT DETECTED NOT DETECTED   Streptococcus agalactiae NOT DETECTED NOT DETECTED   Streptococcus pneumoniae NOT DETECTED NOT DETECTED   Streptococcus pyogenes NOT DETECTED NOT DETECTED   A.calcoaceticus-baumannii NOT DETECTED NOT DETECTED   Bacteroides fragilis NOT DETECTED NOT DETECTED   Enterobacterales NOT DETECTED NOT DETECTED   Enterobacter cloacae complex NOT DETECTED NOT DETECTED   Escherichia coli NOT DETECTED NOT DETECTED   Klebsiella aerogenes NOT DETECTED NOT DETECTED   Klebsiella oxytoca NOT DETECTED NOT DETECTED   Klebsiella pneumoniae NOT DETECTED NOT DETECTED   Proteus species NOT DETECTED NOT DETECTED   Salmonella species NOT DETECTED NOT DETECTED   Serratia marcescens NOT DETECTED NOT DETECTED   Haemophilus influenzae NOT DETECTED NOT DETECTED   Neisseria meningitidis NOT DETECTED  NOT DETECTED   Pseudomonas aeruginosa NOT DETECTED NOT DETECTED   Stenotrophomonas maltophilia NOT DETECTED NOT DETECTED   Candida albicans NOT DETECTED NOT DETECTED   Candida auris NOT DETECTED NOT DETECTED   Candida glabrata NOT DETECTED NOT DETECTED   Candida krusei NOT DETECTED NOT DETECTED   Candida parapsilosis NOT DETECTED NOT DETECTED   Candida tropicalis NOT DETECTED NOT DETECTED   Cryptococcus neoformans/gattii NOT DETECTED NOT DETECTED   Meth resistant mecA/C and MREJ NOT DETECTED NOT [place], PharmD 10/08/2021  7:48 AM

## 2021-10-08 NOTE — Progress Notes (Signed)
Order Requisition to provide support to family of patient who is end of life. Met son [name] bedside, he expects his brother to arrive soon. He shared memories and expressed sorrow. His mother passed in 2012, he says this is his last parent. Was advised staff has requested a comfort cart but will follow up it has not been delivered.

## 2021-10-08 NOTE — ED Notes (Signed)
Report received. Comfort care measures continued.

## 2021-10-08 NOTE — ED Notes (Signed)
At this time pt does not respond verbally or to pain. Pt has no movement. MD aware will continue to monitor.

## 2021-10-09 DIAGNOSIS — J9601 Acute respiratory failure with hypoxia: Secondary | ICD-10-CM | POA: Diagnosis not present

## 2021-10-09 DIAGNOSIS — Z515 Encounter for palliative care: Secondary | ICD-10-CM | POA: Diagnosis not present

## 2021-10-10 LAB — CULTURE, BLOOD (SINGLE): Special Requests: ADEQUATE

## 2021-10-12 NOTE — Progress Notes (Addendum)
Patient expired at 1102. Dr. Max Sane notified who has attempted to reach out to son and had to leave a VM. Called son and he stated he had spoken with Dr. Manuella Ghazi and was en route to the hospital.

## 2021-10-12 NOTE — Death Summary Note (Addendum)
  DEATH SUMMARY   Patient Details  Name: Jacob [name] MRN: [id number] DOB: 1934-03-25  Admission/Discharge Information   Admit Date:  October 10, 2021  Date of Death: Date of Death: 12-Oct-2021  Time of Death: Time of Death: 48  Length of Stay: 2  Code Status:     Code Status Orders  (From admission, onward)           Start     Ordered   10/08/21 1104  DNR (Do not attempt resuscitation)  Continuous       Question Answer Comment  In the event of cardiac or respiratory ARREST Do not call a "code blue"   In the event of cardiac or respiratory ARREST Do not perform Intubation, CPR, defibrillation or ACLS   In the event of cardiac or respiratory ARREST Use medication by any route, position, wound care, and other measures to relive pain and suffering. May use oxygen, suction and manual treatment of airway obstruction as needed for comfort.      10/08/21 1105            Referring Physician: [name], MD (Inactive)    Reason(s) for Hospitalization  Shortness of breath Diagnoses  Preliminary cause of death:   MSSA sepsis Secondary Diagnoses (including complications and co-morbidities):  Principal Problem:   Acute respiratory failure with hypoxia ([place]) Active Problems:   Essential hypertension   Prostate cancer metastatic to bone [facility])   Hospice care   Failure to thrive in adult   AKI (acute kidney injury) ([place])   Acute respiratory failure ([place])   Sepsis ([place])   Pressure injury of skin Brief Hospital Course  87 y.o. male with medical history significant for stage IV prostate cancer with metastatic disease to the bone and rectum who presents to the emergency room via EMS from the skilled nursing facility for evaluation of sudden onset shortness of breath.  10/28: Actively dying.  Patient made full comfort care only  Patient was actively dying.  After discussion with family he was made full comfort care.  Acute respiratory failure with hypoxia (H[facility]) Pressure injury of  skin Sepsis ([place]) AKI (acute kidney injury) ([place]) Failure to thrive in adult Hospice care Prostate cancer metastatic to bone [facility]) Essential hypertension  Pressure injury to buttocks stage 3 present on admission   Khaila Velarde 2021-10-12, 12:23 PM

## 2021-10-12 NOTE — Progress Notes (Signed)
Patient son, Ruta Hinds, visited patient. [facility], [phone] at [place],  [phone].

## 2021-10-12 NOTE — Progress Notes (Signed)
Patient remains comfort care. Patient medicated with Morphine as per orders every 2 hours this shift. Will continue to monitor.

## 2021-10-12 DEATH — deceased

## 2021-10-13 ENCOUNTER — Inpatient Hospital Stay: Payer: Medicare Other

## 2021-10-13 ENCOUNTER — Inpatient Hospital Stay: Payer: Medicare Other | Admitting: Hospice and Palliative Medicine

## 2021-10-14 NOTE — Telephone Encounter (Signed)
Previous note documented on 10/11, see note

## 2021-10-26 ENCOUNTER — Ambulatory Visit: Payer: Medicare Other | Admitting: Oncology

## 2021-10-27 ENCOUNTER — Ambulatory Visit: Payer: Medicare Other

## 2021-10-27 ENCOUNTER — Ambulatory Visit: Payer: Medicare Other | Admitting: Oncology

## 2021-10-27 ENCOUNTER — Other Ambulatory Visit: Payer: Medicare Other

## 2021-11-13 ENCOUNTER — Other Ambulatory Visit: Payer: Self-pay | Admitting: Urology

## 2021-11-13 DIAGNOSIS — N138 Other obstructive and reflux uropathy: Secondary | ICD-10-CM

## 2022-03-02 IMAGING — CT CT ANGIO CHEST
2 of 7 series · 19 of 46 positions shown · IV contrast (APPLIED)
Comparison: Chest radiography same day.

CLINICAL DATA: Pulmonary emboli suspected, high probability.
Shortness of breath. Prostate cancer.

EXAM:
CT ANGIOGRAPHY CHEST WITH CONTRAST
TECHNIQUE: Multidetector CT imaging of the chest was performed using the
standard protocol during bolus administration of intravenous
contrast. Multiplanar CT image reconstructions and MIPs were
obtained to evaluate the vascular anatomy.
CONTRAST:  75mL OMNIPAQUE IOHEXOL 350 MG/ML SOLN

[Series 6: thins · axial · 0.68mm/px · z∈[-720,-431]mm · 16 of 403 slices shown]
[im 21/403  lung]
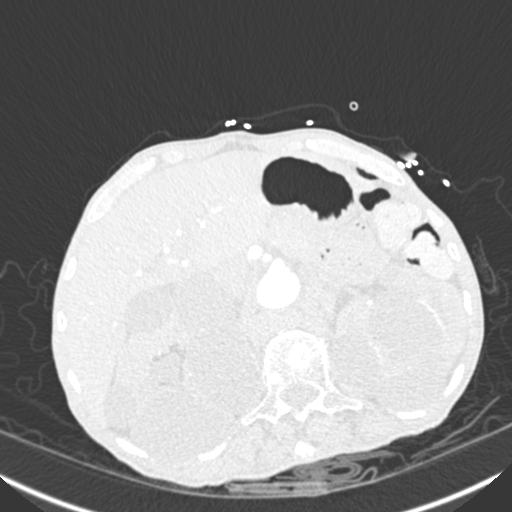
[im 41/403  soft-tissue]
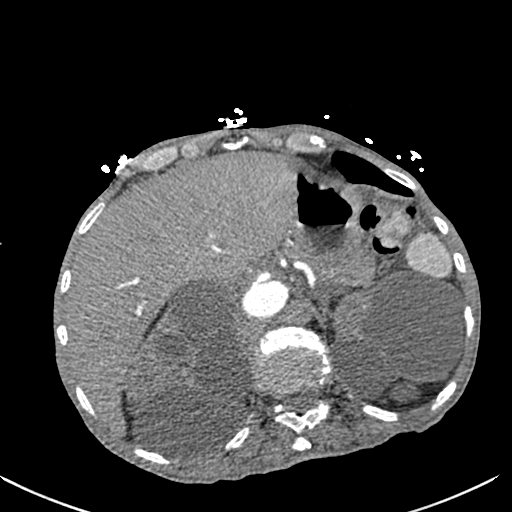
[im 61/403  lung]
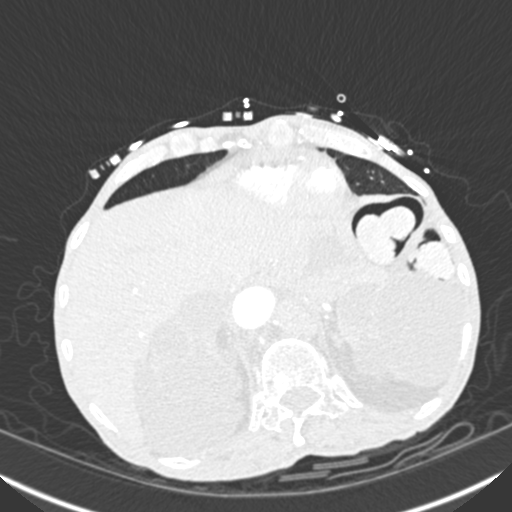
[im 101/403  soft-tissue]
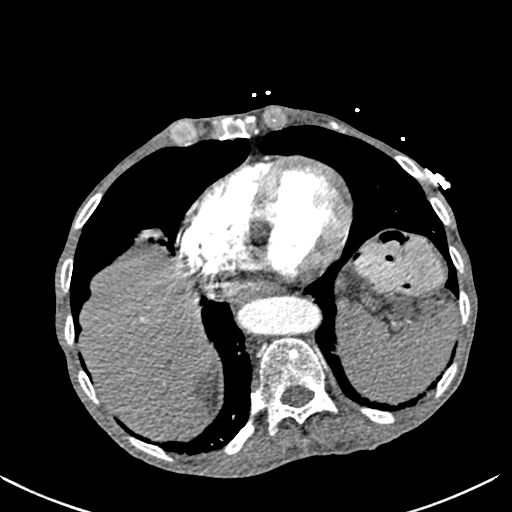
[im 121/403  lung]
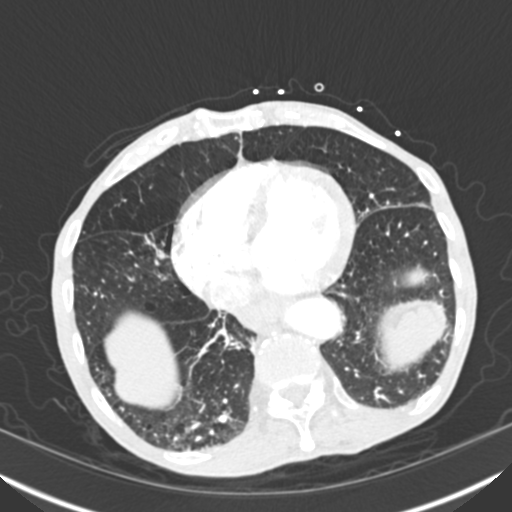
[im 141/403  soft-tissue]
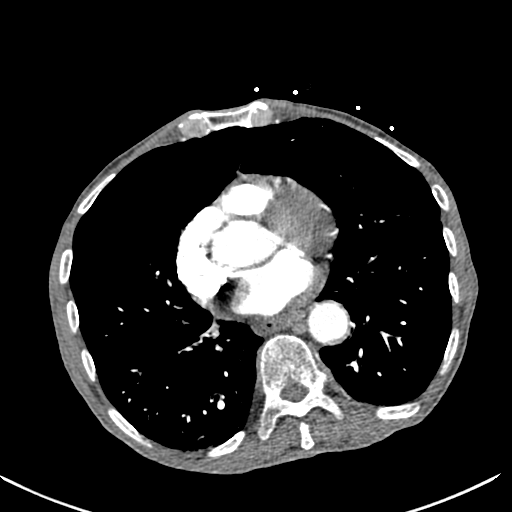
[im 161/403  lung]
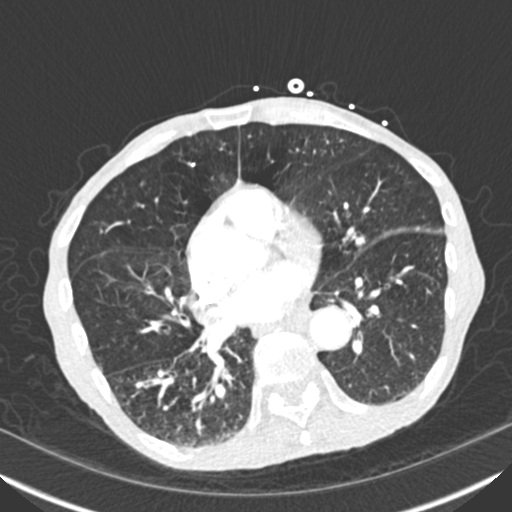
[im 181/403  soft-tissue]
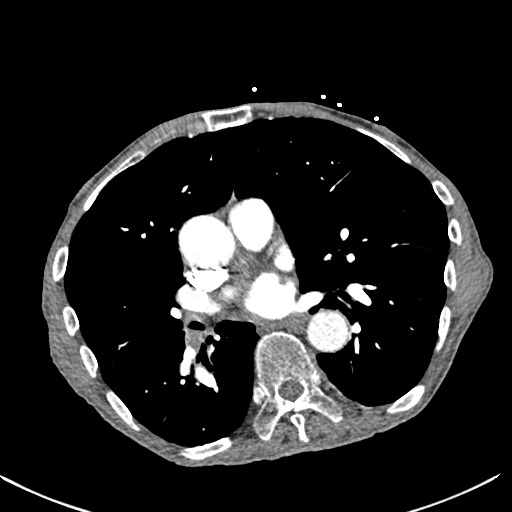
[im 222/403  lung]
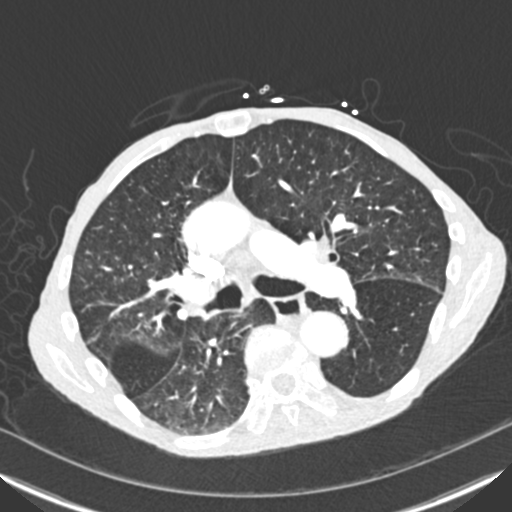
[im 242/403  soft-tissue]
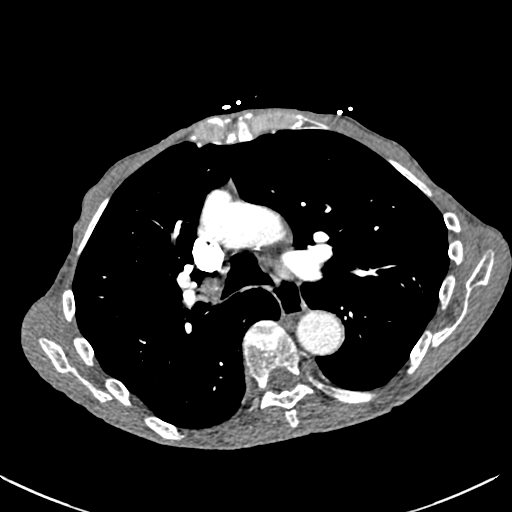
[im 262/403  lung]
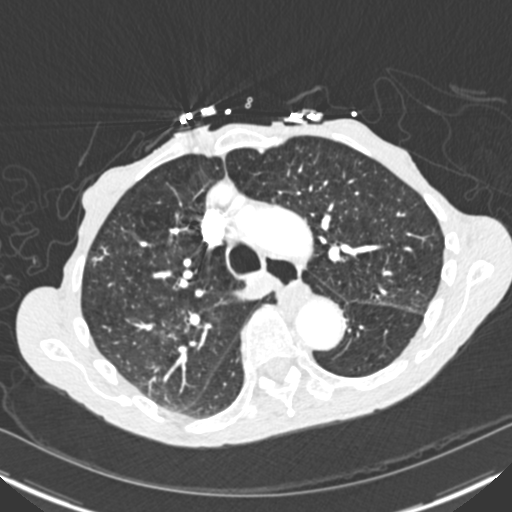
[im 282/403  soft-tissue]
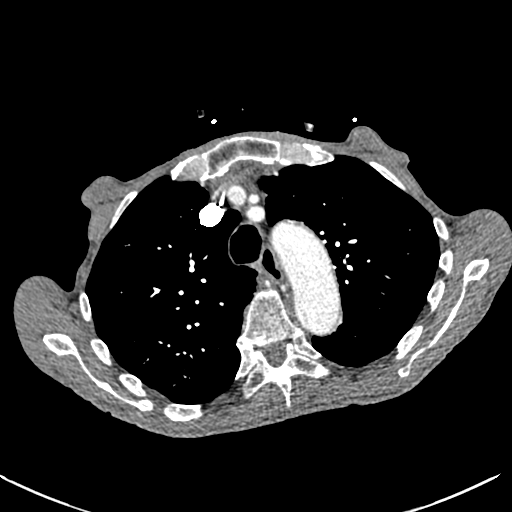
[im 302/403  lung]
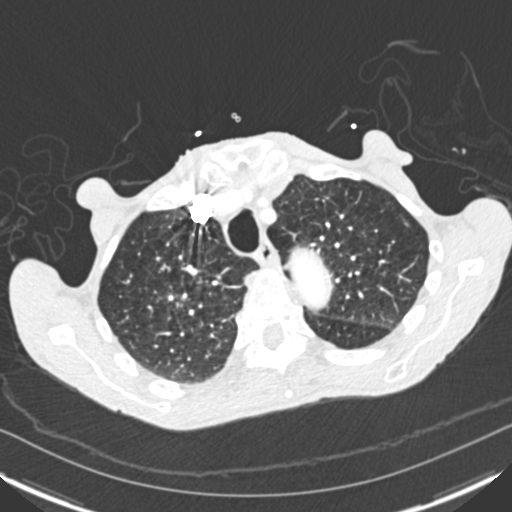
[im 342/403  soft-tissue]
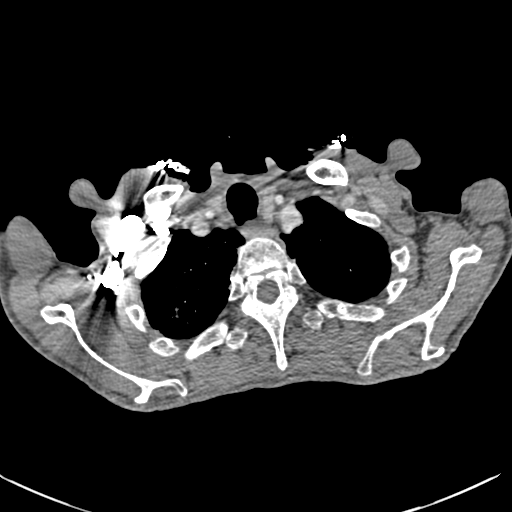
[im 362/403  lung]
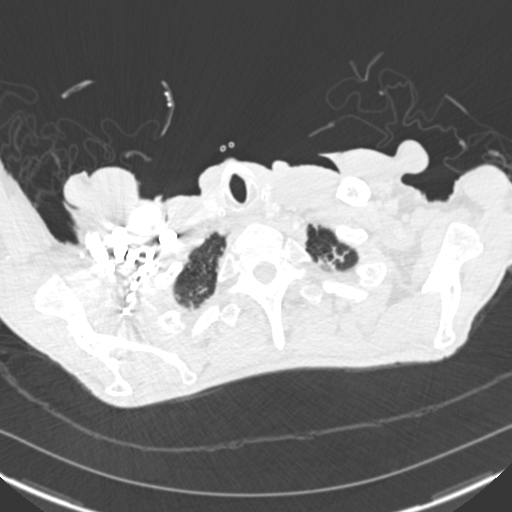
[im 382/403  soft-tissue]
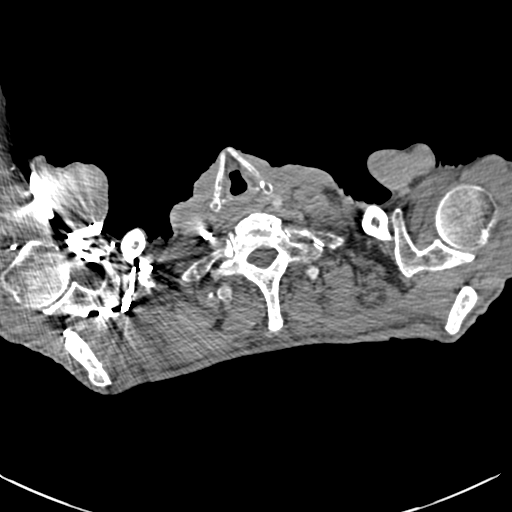

[Series 8: coronal mpr · coronal · 0.63mm/px · 3 of 92 slices shown]
[im 23/92  soft-tissue]
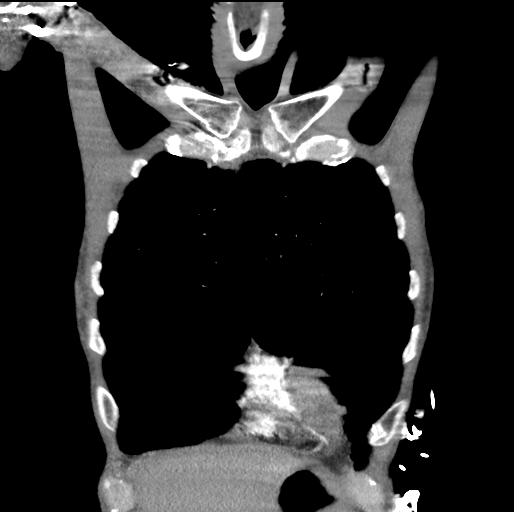
[im 46/92  soft-tissue]
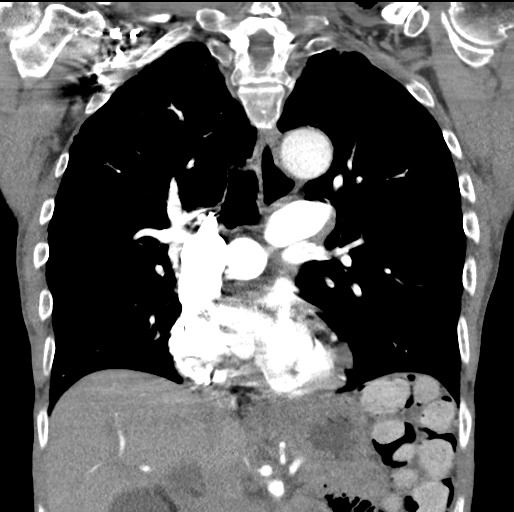
[im 69/92  soft-tissue]
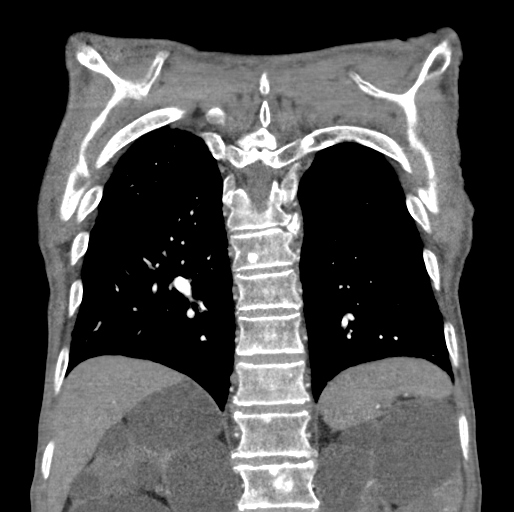

[19 of 46 positions shown; findings below may reference images not displayed]

FINDINGS: Cardiovascular: Heart size is normal. No pericardial fluid. No
visible coronary artery calcification. Mild aortic atherosclerotic
calcification and tortuosity. Pulmonary arterial opacification is
good. There are no pulmonary emboli.

Mediastinum/Nodes: No mediastinal or hilar mass or lymphadenopathy.

Lungs/Pleura: No pleural effusion. Underlying emphysema and
pulmonary scarring. There are scattered areas of hazy opacity in
both lungs which were not present in [REDACTED],
suggesting early pneumonia. No dense consolidation or lobar
collapse.

Upper Abdomen: Multiple cysts of the kidneys as seen previously. 1
cm liver cyst at the dome.

Musculoskeletal: Scoliosis and degenerative change of the spine.
Multiple sclerotic foci throughout the regional skeleton consistent
with known osseous metastatic disease. This is progressive since
[REDACTED].

Review of the MIP images confirms the above findings.
IMPRESSION: No pulmonary emboli.

Aortic Atherosclerosis (CWM4Y-ZW6.6) and Emphysema (CWM4Y-IN3.L).

Increased hazy pulmonary opacities scattered within both lungs, not
present in [REDACTED], favored to represent mild/early pneumonia. No
dense consolidation or lobar collapse.

Progression of sclerotic osseous metastatic disease without evidence
of fracture or lytic destructive lesion.

## 2022-03-02 IMAGING — CR DG CHEST 2V
2 series · 3 of 3 positions shown · non-contrast
Comparison: Examination October 08, 2020.

CLINICAL DATA: Cough shortness of breath. Prostate cancer history
in this 87-year-old male.

EXAM:
CHEST - 2 VIEW

[Series 1: dg chest 2 view · 0.14mm/px · 2 of 2 slices shown (1 of 2)]
[im 1/2]
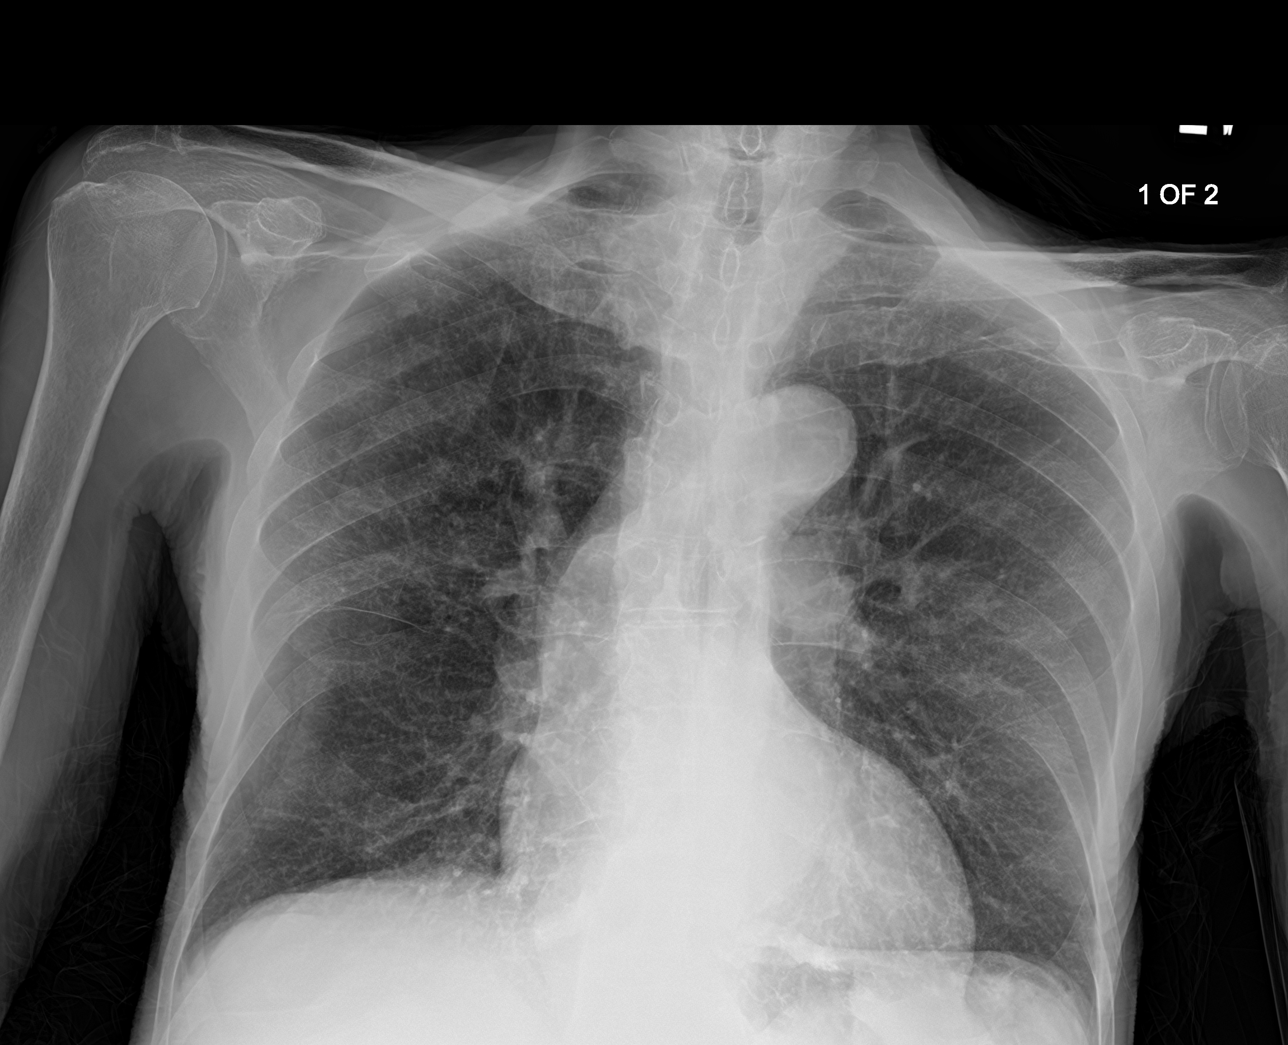
[im 2/2]
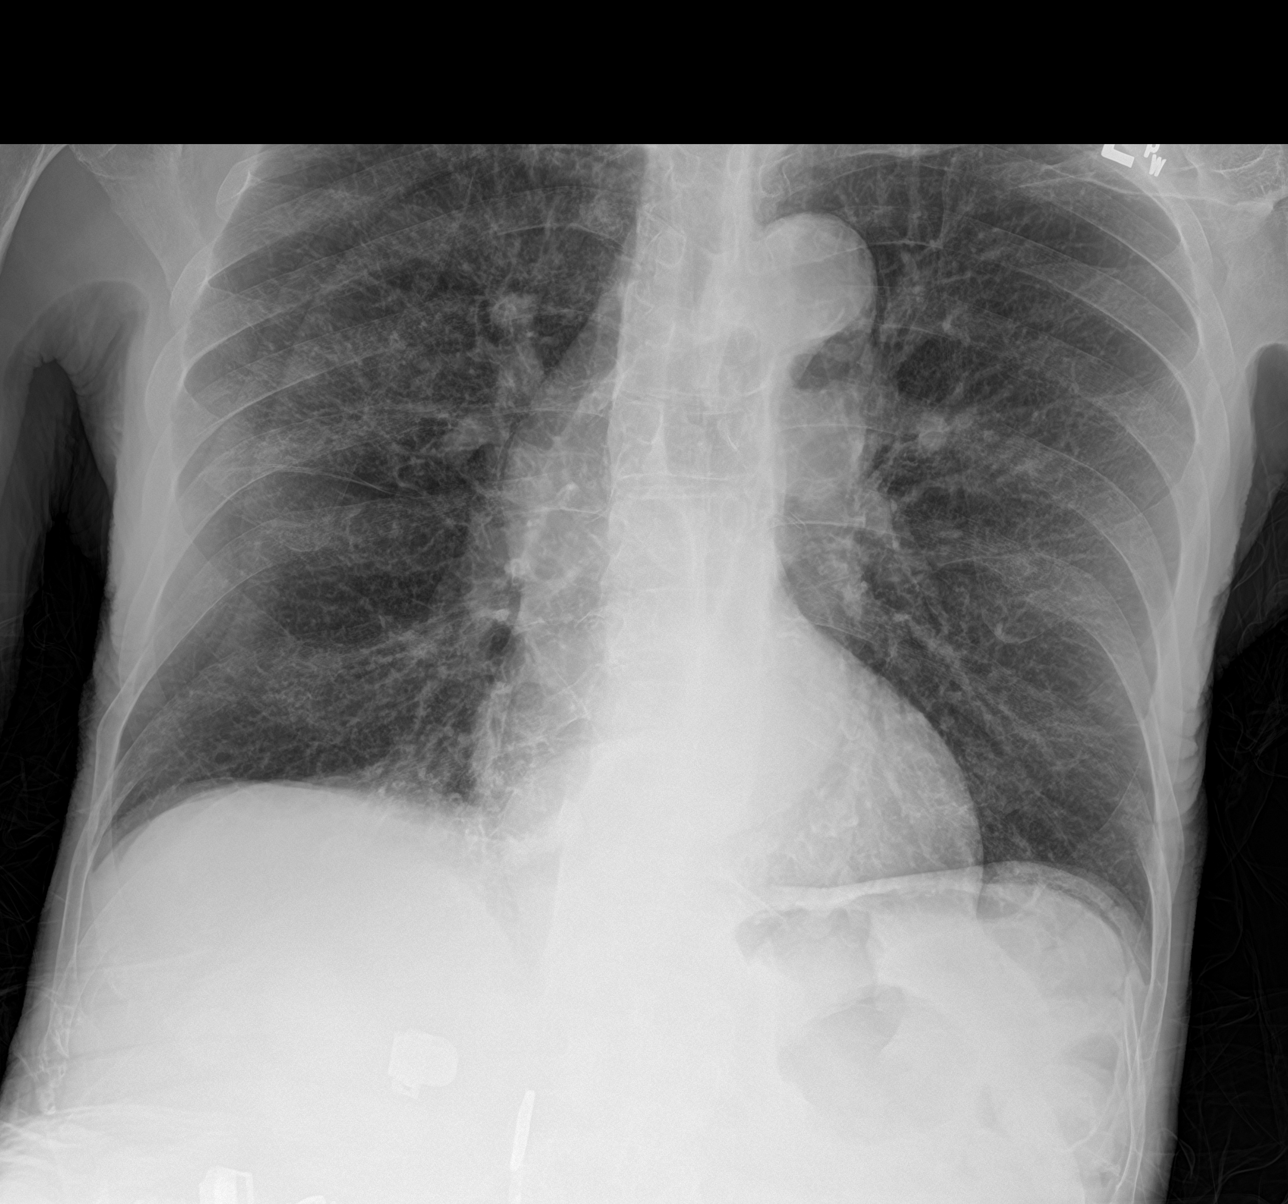

[dg chest 2 view (2 of 2)]
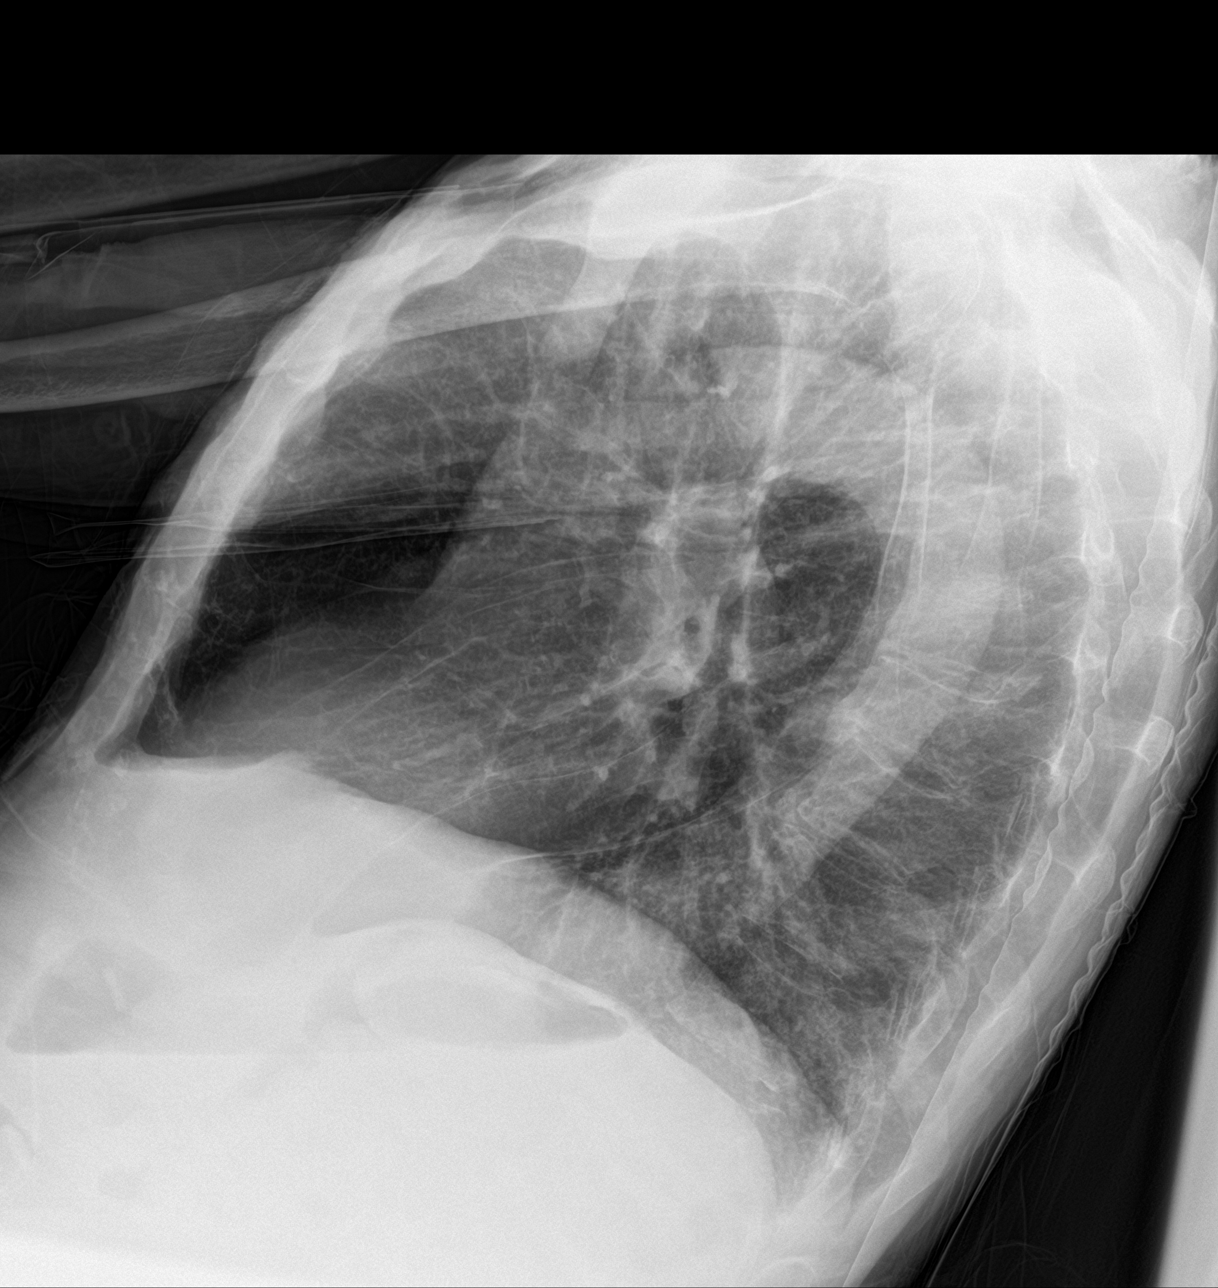

[3 of 3 positions shown; findings below may reference images not displayed]

FINDINGS: Trachea midline. Cardiomediastinal contours and hilar structures are
stable.

No lobar consolidation or sign of pleural effusion.

Lungs are hyperinflated mildly. Tortuous thoracic aorta with similar
appearance.

On limited assessment no acute skeletal process.

No visible pneumothorax. Signs of skin folds projecting over the
RIGHT mid chest.
IMPRESSION: No active cardiopulmonary disease.

## 2023-12-14 NOTE — Telephone Encounter (Signed)
 Created in error
